# Patient Record
Sex: Female | Born: 1975 | Hispanic: Yes | Marital: Married | Smoking: Never smoker
Health system: Southern US, Community
[De-identification: ages and names within clinical notes are randomized; demographics above are authoritative.]

## PROBLEM LIST (undated history)

## (undated) DIAGNOSIS — F419 Anxiety disorder, unspecified: Secondary | ICD-10-CM

## (undated) DIAGNOSIS — O24419 Gestational diabetes mellitus in pregnancy, unspecified control: Secondary | ICD-10-CM

## (undated) DIAGNOSIS — O009 Unspecified ectopic pregnancy without intrauterine pregnancy: Secondary | ICD-10-CM

## (undated) DIAGNOSIS — I10 Essential (primary) hypertension: Secondary | ICD-10-CM

## (undated) DIAGNOSIS — R002 Palpitations: Secondary | ICD-10-CM

## (undated) HISTORY — PX: HERNIA REPAIR: SHX51

---

## 2004-06-19 LAB — SICKLE CELL SCREEN: Sickle Cell Screen: NEGATIVE

## 2005-10-27 ENCOUNTER — Emergency Department (HOSPITAL_COMMUNITY): Admission: EM | Admit: 2005-10-27 | Discharge: 2005-10-27 | Payer: Self-pay | Admitting: Emergency Medicine

## 2005-11-16 ENCOUNTER — Inpatient Hospital Stay (HOSPITAL_COMMUNITY): Admission: AD | Admit: 2005-11-16 | Discharge: 2005-11-16 | Payer: Self-pay | Admitting: Obstetrics

## 2005-11-17 ENCOUNTER — Inpatient Hospital Stay (HOSPITAL_COMMUNITY): Admission: AD | Admit: 2005-11-17 | Discharge: 2005-11-19 | Payer: Self-pay | Admitting: Obstetrics

## 2005-11-21 ENCOUNTER — Emergency Department (HOSPITAL_COMMUNITY): Admission: EM | Admit: 2005-11-21 | Discharge: 2005-11-21 | Payer: Self-pay | Admitting: Emergency Medicine

## 2007-04-03 ENCOUNTER — Emergency Department (HOSPITAL_COMMUNITY): Admission: EM | Admit: 2007-04-03 | Discharge: 2007-04-03 | Payer: Self-pay | Admitting: Emergency Medicine

## 2008-02-23 ENCOUNTER — Inpatient Hospital Stay (HOSPITAL_COMMUNITY): Admission: AD | Admit: 2008-02-23 | Discharge: 2008-02-25 | Payer: Self-pay | Admitting: Obstetrics

## 2008-08-26 ENCOUNTER — Emergency Department (HOSPITAL_COMMUNITY): Admission: EM | Admit: 2008-08-26 | Discharge: 2008-08-27 | Payer: Self-pay | Admitting: Emergency Medicine

## 2008-09-20 ENCOUNTER — Emergency Department (HOSPITAL_COMMUNITY): Admission: EM | Admit: 2008-09-20 | Discharge: 2008-09-20 | Payer: Self-pay | Admitting: Emergency Medicine

## 2008-09-24 ENCOUNTER — Emergency Department (HOSPITAL_COMMUNITY): Admission: EM | Admit: 2008-09-24 | Discharge: 2008-09-24 | Payer: Self-pay | Admitting: Emergency Medicine

## 2008-09-24 DIAGNOSIS — I10 Essential (primary) hypertension: Secondary | ICD-10-CM

## 2008-09-24 HISTORY — DX: Essential (primary) hypertension: I10

## 2011-01-08 LAB — URINALYSIS, ROUTINE W REFLEX MICROSCOPIC
Bilirubin Urine: NEGATIVE
Protein, ur: NEGATIVE mg/dL
Specific Gravity, Urine: 1.011 (ref 1.005–1.030)
Urobilinogen, UA: 1 mg/dL (ref 0.0–1.0)

## 2011-01-08 LAB — COMPREHENSIVE METABOLIC PANEL
ALT: 299 U/L — ABNORMAL HIGH (ref 0–35)
AST: 128 U/L — ABNORMAL HIGH (ref 0–37)
Albumin: 3.8 g/dL (ref 3.5–5.2)
Alkaline Phosphatase: 123 U/L — ABNORMAL HIGH (ref 39–117)
Chloride: 106 mEq/L (ref 96–112)
GFR calc Af Amer: >60 mL/min (ref >60)
Potassium: 3.4 mEq/L — ABNORMAL LOW (ref 3.5–5.1)
Total Bilirubin: 0.7 mg/dL (ref 0.3–1.2)

## 2011-01-08 LAB — CBC
HCT: 40.9 % (ref 36.0–46.0)
Platelets: 218 10*3/uL (ref 150–400)
RBC: 4.72 MIL/uL (ref 3.87–5.11)
WBC: 6.7 10*3/uL (ref 4.0–10.5)

## 2011-01-08 LAB — DIFFERENTIAL
Basophils Absolute: 0.1 10*3/uL (ref 0.0–0.1)
Basophils Relative: 1 % (ref 0–1)
Eosinophils Absolute: 0.6 10*3/uL (ref 0.0–0.7)
Eosinophils Relative: 8 % — ABNORMAL HIGH (ref 0–5)
Monocytes Absolute: 0.3 10*3/uL (ref 0.1–1.0)

## 2011-01-08 LAB — URINE MICROSCOPIC-ADD ON

## 2011-02-09 NOTE — Discharge Summary (Signed)
NAMETERE, MCCONAUGHEY              ACCOUNT NO.:  [id number]   MEDICAL RECORD NO.:  [id number]          PATIENT TYPE:  INP   LOCATION:  9141                          FACILITY:  [facility]   PHYSICIAN:  Kathreen Cosier, M.D.DATE OF BIRTH:  1975-12-09   DATE OF ADMISSION:  11/17/2005  DATE OF DISCHARGE:  11/19/2005                                 DISCHARGE SUMMARY   HOSPITAL COURSE:  The patient is a 35 year old gravida 2, para 0-0-1-0 with  EDC November 22, 2005.  She was admitted with rupture of membranes at 03:30 A.M.  She had a negative GBS.  Blood pressure was 170/105.  Negative protein.  Platelet count 200,000.  She was started on magnesium 4 grams now and 2  grams an hour.  The patient had a normal vaginal delivery over a midline  episiotomy and post delivery her hemoglobin was 9.1.  Mag sulfate was  discontinued on the first postpartum day and her blood pressures were all  normal.  She was discharged on the second postpartum day on Labetalol 200 mg  p.o. b.i.d. and Tylenol #3 for pain and is to see me in one week for blood  pressure check.   DISCHARGE DIAGNOSES:  1.  Status post normal vaginal delivery at term.  2.  Pre-eclampsia.           ______________________________  Kathreen Cosier, M.D.     BAM/MEDQ  D:  12/12/2005  T:  12/12/2005  Job:  [id number]

## 2011-06-21 LAB — COMPREHENSIVE METABOLIC PANEL
ALT: 44 — ABNORMAL HIGH
AST: 38 — ABNORMAL HIGH
Albumin: 2.2 — ABNORMAL LOW
Alkaline Phosphatase: 141 — ABNORMAL HIGH
BUN: 13
CO2: 23
Calcium: 7.8 — ABNORMAL LOW
Chloride: 105
Creatinine, Ser: 0.61
Creatinine, Ser: 0.68
GFR calc Af Amer: >60
GFR calc non Af Amer: >60
Total Bilirubin: 0.4
Total Protein: 5.3 — ABNORMAL LOW

## 2011-06-21 LAB — CBC
HCT: 33.4 — ABNORMAL LOW
MCHC: 35
MCV: 90.5
MCV: 91.1
Platelets: 158
RBC: 4.17
RDW: 13.7
WBC: 10.1

## 2011-06-21 LAB — LACTATE DEHYDROGENASE: LDH: 203

## 2011-06-21 LAB — RPR: RPR Ser Ql: NONREACTIVE

## 2011-06-21 LAB — MAGNESIUM: Magnesium: 6 — ABNORMAL HIGH

## 2011-06-21 LAB — URIC ACID: Uric Acid, Serum: 5.2

## 2011-06-29 LAB — POCT I-STAT, CHEM 8
BUN: 16 mg/dL (ref 6–23)
Calcium, Ion: 1.13 mmol/L (ref 1.12–1.32)
Chloride: 103 meq/L (ref 96–112)
Creatinine, Ser: 0.8 mg/dL (ref 0.4–1.2)
Glucose, Bld: 122 mg/dL — ABNORMAL HIGH (ref 70–99)
HCT: 41 % (ref 36.0–46.0)
Hemoglobin: 13.9 g/dL (ref 12.0–15.0)
Potassium: 3.6 mEq/L (ref 3.5–5.1)
Sodium: 140 meq/L (ref 135–145)
TCO2: 26 mmol/L (ref 0–100)

## 2011-06-29 LAB — URINE MICROSCOPIC-ADD ON

## 2011-06-29 LAB — URINALYSIS, ROUTINE W REFLEX MICROSCOPIC
Bilirubin Urine: NEGATIVE
Nitrite: NEGATIVE
Protein, ur: NEGATIVE mg/dL
Specific Gravity, Urine: 1.016 (ref 1.005–1.030)
Urobilinogen, UA: 0.2 mg/dL (ref 0.0–1.0)

## 2011-06-29 LAB — HEPATIC FUNCTION PANEL
ALT: 87 U/L — ABNORMAL HIGH (ref 0–35)
AST: 108 U/L — ABNORMAL HIGH (ref 0–37)
Albumin: 3.9 g/dL (ref 3.5–5.2)
Alkaline Phosphatase: 97 U/L (ref 39–117)
Bilirubin, Direct: 0.2 mg/dL (ref 0.0–0.3)
Indirect Bilirubin: 0.2 mg/dL — ABNORMAL LOW (ref 0.3–0.9)
Total Bilirubin: 0.4 mg/dL (ref 0.3–1.2)
Total Protein: 7.5 g/dL (ref 6.0–8.3)

## 2011-06-29 LAB — LIPASE, BLOOD: Lipase: 38 U/L (ref 11–59)

## 2012-03-02 ENCOUNTER — Emergency Department (HOSPITAL_COMMUNITY)
Admission: EM | Admit: 2012-03-02 | Discharge: 2012-03-02 | Disposition: A | Discharge status: Home / Self Care | Payer: Self-pay | Attending: Emergency Medicine | Admitting: Emergency Medicine

## 2012-03-02 ENCOUNTER — Emergency Department (HOSPITAL_COMMUNITY): Payer: Self-pay

## 2012-03-02 ENCOUNTER — Encounter (HOSPITAL_COMMUNITY): Payer: Self-pay | Admitting: *Deleted

## 2012-03-02 DIAGNOSIS — R079 Chest pain, unspecified: Secondary | ICD-10-CM | POA: Insufficient documentation

## 2012-03-02 DIAGNOSIS — R1013 Epigastric pain: Secondary | ICD-10-CM | POA: Insufficient documentation

## 2012-03-02 DIAGNOSIS — I10 Essential (primary) hypertension: Secondary | ICD-10-CM | POA: Insufficient documentation

## 2012-03-02 DIAGNOSIS — F419 Anxiety disorder, unspecified: Secondary | ICD-10-CM

## 2012-03-02 DIAGNOSIS — F411 Generalized anxiety disorder: Secondary | ICD-10-CM | POA: Insufficient documentation

## 2012-03-02 DIAGNOSIS — K219 Gastro-esophageal reflux disease without esophagitis: Secondary | ICD-10-CM | POA: Insufficient documentation

## 2012-03-02 HISTORY — DX: Essential (primary) hypertension: I10

## 2012-03-02 LAB — HEPATIC FUNCTION PANEL
Bilirubin, Direct: <0.1 mg/dL (ref 0.0–0.3)
Total Bilirubin: 0.3 mg/dL (ref 0.3–1.2)

## 2012-03-02 LAB — CBC
MCH: 30.3 pg (ref 26.0–34.0)
MCHC: 34.8 g/dL (ref 30.0–36.0)
Platelets: 220 10*3/uL (ref 150–400)
RBC: 4.66 MIL/uL (ref 3.87–5.11)
RDW: 13 % (ref 11.5–15.5)

## 2012-03-02 LAB — URINALYSIS, ROUTINE W REFLEX MICROSCOPIC
Ketones, ur: NEGATIVE mg/dL
Leukocytes, UA: NEGATIVE
Nitrite: NEGATIVE
Urobilinogen, UA: 0.2 mg/dL (ref 0.0–1.0)
pH: 7.5 (ref 5.0–8.0)

## 2012-03-02 LAB — POCT I-STAT, CHEM 8
Calcium, Ion: 1.18 mmol/L (ref 1.12–1.32)
HCT: 42 % (ref 36.0–46.0)
Hemoglobin: 14.3 g/dL (ref 12.0–15.0)
Sodium: 141 mEq/L (ref 135–145)
TCO2: 23 mmol/L (ref 0–100)

## 2012-03-02 LAB — POCT I-STAT TROPONIN I: Troponin i, poc: 0 ng/mL (ref 0.00–0.08)

## 2012-03-02 MED ORDER — OMEPRAZOLE 20 MG PO CPDR: 20.0000 mg | DELAYED_RELEASE_CAPSULE | Freq: Every day | ORAL | Status: DC

## 2012-03-02 MED ORDER — GI COCKTAIL ~~LOC~~
ORAL | Status: AC
  Administered 2012-03-02: 30 mL via ORAL
  Filled 2012-03-02: qty 30

## 2012-03-02 MED ORDER — GI COCKTAIL ~~LOC~~
30.0000 mL | Freq: Once | ORAL | Status: AC
  Administered 2012-03-02: 30 mL via ORAL

## 2012-03-02 NOTE — ED Notes (Signed)
Patient for discharge.She denies any pain.GCS 15.

## 2012-03-02 NOTE — ED Provider Notes (Signed)
History     CSN: [id number]  Arrival date & time 03/02/12  0244   First MD Initiated Contact with Patient 03/02/12 [phone]      Chief Complaint  Patient presents with  . Chest Pain  . Abdominal Pain    (Consider location/radiation/quality/duration/timing/severity/associated sxs/prior treatment) Patient is a 36 y.o. female presenting with abdominal pain and anxiety. The history is provided by the patient. A language interpreter was used.  Abdominal Pain The primary symptoms of the illness include abdominal pain. The primary symptoms of the illness do not include shortness of breath. The current episode started yesterday. The onset of the illness was sudden. The problem has not changed since onset. The abdominal pain began yesterday. The pain came on suddenly. The abdominal pain has been unchanged since its onset. The abdominal pain is located in the epigastric region. Pain radiation: up into chest. The severity of the abdominal pain is 10/10. The abdominal pain is relieved by nothing. Exacerbated by: nothing.  Associated with: anxiety and feeling like she is having palpitations. The patient states that she believes she is currently not pregnant. The patient has not had a change in bowel habit. Risk factors: none. Symptoms associated with the illness do not include anorexia, diaphoresis or back pain. Significant associated medical issues do not include cardiac disease.  Anxiety This is a recurrent problem. The current episode started yesterday. The problem occurs constantly. The problem has not changed since onset.Associated symptoms include abdominal pain. Pertinent negatives include no headaches and no shortness of breath. The symptoms are aggravated by nothing. She has tried nothing for the symptoms. The treatment provided no relief.  PERC negative, no long car trips or plane trips no swelling or pain in the lower extremities.  No long car plane nor train trips Anxiety bring on the palpitations  and making epigastric pain worse Past Medical History  Diagnosis Date  . Hypertension     History reviewed. No pertinent past surgical history.  History reviewed. No pertinent family history.  History  Substance Use Topics  . Smoking status: Not on file  . Smokeless tobacco: Not on file  . Alcohol Use:     OB History    Grav Para Term Preterm Abortions TAB SAB Ect Mult Living                  Review of Systems  Constitutional: Negative for diaphoresis.  Respiratory: Negative for shortness of breath.   Gastrointestinal: Positive for abdominal pain. Negative for anorexia.  Musculoskeletal: Negative for back pain.  Neurological: Negative for headaches.  All other systems reviewed and are negative.    Allergies  Review of patient's allergies indicates no known allergies.  Home Medications  No current outpatient prescriptions on file.  BP 161/104  Pulse 91  Temp(Src) 98.1 F (36.7 C) (Oral)  Resp 16  SpO2 100%  Physical Exam  Constitutional: She appears well-developed and well-nourished. No distress.  HENT:  Head: Normocephalic and atraumatic.  Mouth/Throat: Oropharynx is clear and moist.  Eyes: Conjunctivae are normal. Pupils are equal, round, and reactive to light.  Neck: Normal range of motion. No JVD present.  Cardiovascular: Normal rate and regular rhythm.  Exam reveals no gallop.   No murmur heard. Pulmonary/Chest: Effort normal and breath sounds normal. She has no wheezes. She has no rales.  Abdominal: Soft. Bowel sounds are normal. She exhibits no mass. There is no tenderness. There is no rebound and no guarding.  Musculoskeletal: Normal range of motion. She  exhibits no edema and no tenderness.  Lymphadenopathy:    She has no cervical adenopathy.  Neurological: She is alert. She has normal reflexes.  Skin: Skin is warm and dry. She is not diaphoretic.  Psychiatric: She has a normal mood and affect.    ED Course  Procedures (including critical care  time)  Labs Reviewed  URINALYSIS, ROUTINE W REFLEX MICROSCOPIC - Abnormal; Notable for the following:    Specific Gravity, Urine 1.004 (*)    All other components within normal limits  POCT I-STAT, CHEM 8 - Abnormal; Notable for the following:    Glucose, Bld 132 (*)    All other components within normal limits  POCT PREGNANCY, URINE  CBC  POCT I-STAT TROPONIN I  HEPATIC FUNCTION PANEL  LIPASE, BLOOD   No results found.   No diagnosis found.    MDM   Date: 03/02/2012  Rate: 92  Rhythm: normal sinus rhythm  QRS Axis: normal  Intervals: QT prolonged  ST/T Wave abnormalities: normal  Conduction Disutrbances:none  Narrative Interpretation:   Old EKG Reviewed: none available   Symptom are consistent with GERD as were relieved completely with GI cocktail and superimposed anxiety.   Follow up with your family doctor for BP recheck.  Return for chest pain shortness of breath or any concerns      Adlyn Fife K Sigmund Morera-Rasch, MD 03/02/12 [phone]

## 2012-03-02 NOTE — ED Notes (Signed)
Using translator phones pt states that she is having palpations, and is experiencing anxiety and is having nausea and diarrhea. Pt states that this started at work yesterday and it went away. Then tonight it started again at midnight.

## 2012-03-02 NOTE — ED Notes (Signed)
Pt presented to ED with epigastric/abdo pain.GCS 15.Feels better now.

## 2013-12-12 ENCOUNTER — Emergency Department (HOSPITAL_COMMUNITY): Payer: No Typology Code available for payment source

## 2013-12-12 ENCOUNTER — Encounter (HOSPITAL_COMMUNITY): Payer: Self-pay | Admitting: Emergency Medicine

## 2013-12-12 ENCOUNTER — Emergency Department (HOSPITAL_COMMUNITY)
Admission: EM | Admit: 2013-12-12 | Discharge: 2013-12-12 | Disposition: A | Discharge status: Home / Self Care | Payer: No Typology Code available for payment source | Attending: Emergency Medicine | Admitting: Emergency Medicine

## 2013-12-12 DIAGNOSIS — O4690 Antepartum hemorrhage, unspecified, unspecified trimester: Secondary | ICD-10-CM

## 2013-12-12 DIAGNOSIS — B9689 Other specified bacterial agents as the cause of diseases classified elsewhere: Secondary | ICD-10-CM | POA: Insufficient documentation

## 2013-12-12 DIAGNOSIS — O169 Unspecified maternal hypertension, unspecified trimester: Secondary | ICD-10-CM | POA: Insufficient documentation

## 2013-12-12 DIAGNOSIS — A499 Bacterial infection, unspecified: Secondary | ICD-10-CM | POA: Insufficient documentation

## 2013-12-12 DIAGNOSIS — O2390 Unspecified genitourinary tract infection in pregnancy, unspecified trimester: Secondary | ICD-10-CM | POA: Insufficient documentation

## 2013-12-12 DIAGNOSIS — Z8659 Personal history of other mental and behavioral disorders: Secondary | ICD-10-CM | POA: Insufficient documentation

## 2013-12-12 DIAGNOSIS — O209 Hemorrhage in early pregnancy, unspecified: Secondary | ICD-10-CM | POA: Insufficient documentation

## 2013-12-12 DIAGNOSIS — Z79899 Other long term (current) drug therapy: Secondary | ICD-10-CM | POA: Insufficient documentation

## 2013-12-12 DIAGNOSIS — N760 Acute vaginitis: Secondary | ICD-10-CM | POA: Insufficient documentation

## 2013-12-12 HISTORY — DX: Anxiety disorder, unspecified: F41.9

## 2013-12-12 LAB — CBC
HCT: 39.7 % (ref 36.0–46.0)
HEMOGLOBIN: 14.1 g/dL (ref 12.0–15.0)
MCH: 31.2 pg (ref 26.0–34.0)
MCHC: 35.5 g/dL (ref 30.0–36.0)
MCV: 87.8 fL (ref 78.0–100.0)
Platelets: 247 10*3/uL (ref 150–400)
RBC: 4.52 MIL/uL (ref 3.87–5.11)
RDW: 13 % (ref 11.5–15.5)
WBC: 9.9 10*3/uL (ref 4.0–10.5)

## 2013-12-12 LAB — URINALYSIS, ROUTINE W REFLEX MICROSCOPIC
BILIRUBIN URINE: NEGATIVE
GLUCOSE, UA: NEGATIVE mg/dL
Ketones, ur: NEGATIVE mg/dL
Leukocytes, UA: NEGATIVE
NITRITE: NEGATIVE
PH: 7.5 (ref 5.0–8.0)
Protein, ur: NEGATIVE mg/dL
SPECIFIC GRAVITY, URINE: 1.016 (ref 1.005–1.030)
Urobilinogen, UA: 0.2 mg/dL (ref 0.0–1.0)

## 2013-12-12 LAB — PREGNANCY, URINE: Preg Test, Ur: POSITIVE — AB

## 2013-12-12 LAB — URINE MICROSCOPIC-ADD ON

## 2013-12-12 LAB — WET PREP, GENITAL
Trich, Wet Prep: NONE SEEN
Yeast Wet Prep HPF POC: NONE SEEN

## 2013-12-12 LAB — HCG, QUANTITATIVE, PREGNANCY: HCG, BETA CHAIN, QUANT, S: 2494 m[IU]/mL — AB (ref <5)

## 2013-12-12 MED ORDER — METRONIDAZOLE 500 MG PO TABS: 500.0000 mg | ORAL_TABLET | Freq: Two times a day (BID) | ORAL | Status: DC

## 2013-12-12 NOTE — ED Notes (Signed)
Back from xray

## 2013-12-12 NOTE — ED Provider Notes (Signed)
CSN: [id number]     Arrival date & time 12/12/13  0912 History   First MD Initiated Contact with Patient 12/12/13 1009     Chief Complaint  Patient presents with  . Vaginal Bleeding  . Possible Pregnancy      HPI Pt was seen at 1025. Per pt, c/o gradual onset and persistence of constant vaginal bleeding since yesterday. Pt states she noticed blood on the toilet paper after she wiped yesterday. Today, she states "there is more blood spots on the toilet paper."  [facility] and was told she "was pregnant."  Hx G4P2SAB1 with LMP 09/25/13, EGA 11-1/7 weeks.  Denies vaginal discharge, no abd/pelvic pain, no back pain, no CP/SOB.    No OB/GYN Past Medical History  Diagnosis Date  . Hypertension   . GERD (gastroesophageal reflux disease)   . Anxiety    No past surgical history on file.  History  Substance Use Topics  . Smoking status: Never Smoker   . Smokeless tobacco: Not on file  . Alcohol Use: No    Review of Systems ROS: Statement: All systems negative except as marked or noted in the HPI; Constitutional: Negative for fever and chills. ; ; Eyes: Negative for eye pain, redness and discharge. ; ; ENMT: Negative for ear pain, hoarseness, nasal congestion, sinus pressure and sore throat. ; ; Cardiovascular: Negative for chest pain, palpitations, diaphoresis, dyspnea and peripheral edema. ; ; Respiratory: Negative for cough, wheezing and stridor. ; ; Gastrointestinal: Negative for nausea, vomiting, diarrhea, abdominal pain, blood in stool, hematemesis, jaundice and rectal bleeding. . ; ; Genitourinary: Negative for dysuria, flank pain and hematuria. ; ; GYN:  +vaginal bleeding, no vaginal discharge, no vulvar pain.;;  Musculoskeletal: Negative for back pain and neck pain. Negative for swelling and trauma.; ; Skin: Negative for pruritus, rash, abrasions, blisters, bruising and skin lesion.; ; Neuro: Negative for headache, lightheadedness and neck stiffness.  Negative for weakness, altered level of consciousness , altered mental status, extremity weakness, paresthesias, involuntary movement, seizure and syncope.      Allergies  Review of patient's allergies indicates no known allergies.  Home Medications   Current Outpatient Rx  Name  Route  Sig  Dispense  Refill  . Prenatal Vit-Fe Fumarate-FA (PRENATAL MULTIVITAMIN) TABS tablet   Oral   Take 1 tablet by mouth daily at 12 noon.         Marland Kitchen PRESCRIPTION MEDICATION   Oral   Take 1 tablet by mouth daily. Blood Pressure medication          BP 150/90  Pulse 66  Temp(Src) 98 F (36.7 C) (Oral)  Resp 20  Wt 144 lb 4 oz (65.431 kg)  SpO2 97%  LMP 09/25/2013 Physical Exam 1030; Physical examination:  Nursing notes reviewed; Vital signs and O2 SAT reviewed;  Constitutional: Well developed, Well nourished, Well hydrated, In no acute distress; Head:  Normocephalic, atraumatic; Eyes: EOMI, PERRL, No scleral icterus; ENMT: Mouth and pharynx normal, Mucous membranes moist; Neck: Supple, Full range of motion, No lymphadenopathy; Cardiovascular: Regular rate and rhythm, No gallop; Respiratory: Breath sounds clear & equal bilaterally, No rales, rhonchi, wheezes.  Speaking full sentences with ease, Normal respiratory effort/excursion; Chest: Nontender, Movement normal; Abdomen: Soft, Nontender, Nondistended, Normal bowel sounds; Genitourinary: No CVA tenderness. Pelvic exam performed with permission of pt and female ED RN assist during exam.  External genitalia w/o lesions. Vaginal vault without discharge, +dark blood with several clots.  Cervix w/o  lesions, not friable, os is open without active bleeding. GC/chlam and wet prep obtained and sent to lab..;; Extremities: Pulses normal, No tenderness, No edema, No calf edema or asymmetry.; Neuro: AA&Ox3, Major CN grossly intact.  Speech clear. No gross focal motor or sensory deficits in extremities. Climbs on and off stretcher easily by herself. Gait steady.;  Skin: Color normal, Warm, Dry.   ED Course  Procedures     EKG Interpretation None      MDM  MDM Reviewed: previous chart, nursing note and vitals Interpretation: labs and ultrasound    Results for orders placed during the hospital encounter of 12/12/13  WET PREP, GENITAL      Result Value Ref Range   Yeast Wet Prep HPF POC NONE SEEN  NONE SEEN   Trich, Wet Prep NONE SEEN  NONE SEEN   Clue Cells Wet Prep HPF POC RARE (*) NONE SEEN   WBC, Wet Prep HPF POC RARE (*) NONE SEEN  URINALYSIS, ROUTINE W REFLEX MICROSCOPIC      Result Value Ref Range   Color, Urine YELLOW  YELLOW   APPearance CLEAR  CLEAR   Specific Gravity, Urine 1.016  1.005 - 1.030   pH 7.5  5.0 - 8.0   Glucose, UA NEGATIVE  NEGATIVE mg/dL   Hgb urine dipstick LARGE (*) NEGATIVE   Bilirubin Urine NEGATIVE  NEGATIVE   Ketones, ur NEGATIVE  NEGATIVE mg/dL   Protein, ur NEGATIVE  NEGATIVE mg/dL   Urobilinogen, UA 0.2  0.0 - 1.0 mg/dL   Nitrite NEGATIVE  NEGATIVE   Leukocytes, UA NEGATIVE  NEGATIVE  PREGNANCY, URINE      Result Value Ref Range   Preg Test, Ur POSITIVE (*) NEGATIVE  CBC      Result Value Ref Range   WBC 9.9  4.0 - 10.5 K/uL   RBC 4.52  3.87 - 5.11 MIL/uL   Hemoglobin 14.1  12.0 - 15.0 g/dL   HCT 16.1  09.6 - 04.5 %   MCV 87.8  78.0 - 100.0 fL   MCH 31.2  26.0 - 34.0 pg   MCHC 35.5  30.0 - 36.0 g/dL   RDW 40.9  81.1 - 91.4 %   Platelets 247  150 - 400 K/uL  HCG, QUANTITATIVE, PREGNANCY      Result Value Ref Range   hCG, Beta Chain, Quant, S 2494 (*) <5 mIU/mL  URINE MICROSCOPIC-ADD ON      Result Value Ref Range   Squamous Epithelial / LPF FEW (*) RARE   RBC / HPF 21-50  <3 RBC/hpf  ABO/RH      Result Value Ref Range   ABO/RH(D) O POS     US Ob Transvaginal 12/12/2013   CLINICAL DATA:  Pregnant, vaginal bleeding, beta HCG 2494  EXAM: OBSTETRIC <14 WK [place] AND TRANSVAGINAL OB US  TECHNIQUE: Both transabdominal and transvaginal ultrasound examinations were performed for complete  evaluation of the gestation as well as the maternal uterus, adnexal regions, and pelvic cul-de-sac. Transvaginal technique was performed to assess early pregnancy.  COMPARISON:  None.  FINDINGS: Intrauterine gestational sac: Not visualized  Maternal uterus/adnexae: Endometrial complex measures 8 mm. Scattered cystic foci at the junction of the endometrium and myometrium, without discrete gestational sac.  Left ovary measures 3.3 x 2.0 x 2.4 cm and is within normal limits.  Right ovary measures 6.1 x 1.5 x 4.1 cm. Associated complex cystic lesion appears intra-ovarian and is favored to reflect a corpus luteum (image 41). Adjacent 2.9  x 2.0 x 2.8 cm cystic lesion may be paraovarian (image 45) but does not demonstrate significant vascularity.  No free fluid.  IMPRESSION: No IUP is visualized.  By definition, this reflects a pregnancy of unknown location. Primary differential considerations include abnormal IUP/missed abortion versus nonvisualized ectopic. Early normal IUP is considered unlikely given the beta HCG of 2494.  Two right ovarian lesions are present, favored to reflect an intra-ovarian corpus luteum and a benign paraovarian cyst. Neither is specific for ectopic pregnancy, although this is difficult to exclude for the paraovarian lesion.  Correlate with serial beta HCG. If increasing, a right adnexal ectopic would be the diagnosis of exclusion. If decreasing, this likely reflects a missed abortion.   Electronically Signed   By: Charline BillsSriyesh  Krishnan M.D.   On: 12/12/2013 13:04    1430:  Pt's VS remain stable, abd soft/NT. Continues to deny abd/pelvic pain. T/C to OB/GYN Dr. Marice Potterove, case discussed, including:  HPI, pertinent PM/SHx, VS/PE, dx testing, ED course and treatment:  requests to discharge pt, have [facility] in 48 hours for re-check of HCG quant, and also inform pt to [facility] if she has any problems/concerns before then.  Dx and testing, as well as d/w OB/GYN MD, d/w pt.   Questions answered.  Verb understanding, agreeable to d/c home with outpt f/u.     Laray AngerKathleen M Fisher Hargadon, DO 12/14/13 2135

## 2013-12-12 NOTE — ED Notes (Addendum)
Per interpreter:""I am 2 months pregnant and yesterday, when I wiped after going to bathroom I found some blood on the toilet paper.  Yesterday not so much blood but this morning it was a little bit more."    Patient states, "I have been feeling fine, there is no pain, there is only blood spots on toilet paper."   Denies changes in urinary status, denies vaginal discharge, denies abdominal pain or any pain whatsoever.  No acute distress noted.

## 2013-12-12 NOTE — ED Notes (Signed)
Patient taken to US via stretcher.

## 2013-12-12 NOTE — ED Notes (Addendum)
Pt presents with possible pregnancy complication. Pt states that she is 2 months pregnant. Pt states that "it was pink on the paper". Pt denies any recent illness, abd cramps orother discharge. Pt is G3 P2.  Pt has a positive pregnancy with [facility] on 3.3.

## 2013-12-12 NOTE — Discharge Instructions (Signed)
Resource Guide (Spanish)   Problemas Dentales  Pacientes con Medicaid    :                                                               [facility] [place]   [employer] [phone]                          [phone] [address] [place], [place]                          [place], [place]                                             Phone:  [phone]               Phone:  [phone] Si usted no puede pagar o no tiene seguro mdico/dental pngase en contacto con: [facility] o el departamento de salud de [place] para que le autoricen el uso de la Therapist, art para adultos.  Problemas con Dolor Crnico Pngase en contacto con la clinica de dolores cronicos en el hospital de [place], [phone].   Los pacientes deben ser [employer] por su medico de cabecera.  Si usted no tiene dinero para pagar sus medicinas Pngase en contacto con [employer]:  llame al "211" o Insurance underwriter.   Phone: [phone]  Si usted no tiene mdico de [employer]      Phone: [phone]. Otras agencias que ofrecen cuidado mdico barato son:      [facility] [place]  [phone]      [facility]  [phone]      [facility]  [phone]      [facility]  [phone]      [facility]  [phone]      [facility]  [phone]  [facility] L[facility]  [phone] [facility]  [phone] [facility]  [phone]  (servicios de emergencia [phone])  Abuse/Neglect [facility] Child Abuse Hotline [phone] [facility] Child Abuse Hotline 773-075-4964 (After Hours)  Emergency Shelter [facility] [phone]  Maternity Homes [facility] [place] [phone] [facility] [phone]  MRSA Hotline #:   8487578369  Servicios de [facility] [place]    [employer]           [facility] Dept. [address]                                    [address]       [address] [place]                                         Michell Heinrich  [place]hell [place]rich[place] Phone:  [phone]                            Phone: [phone]                Phone: [phone]  [facility] Phone:  [phone]  [facility] Child Abuse Hotline (671)592-6470(336) (351) 617-4684 [phone](336) [phone] (After Hours)    Your ultrasound today did not show a gestational sac today.  This may be because of a threatened miscarriage, or because of an ectopic pregnancy.  You will need a repeat of your "quantitative HCG" (blood test) in 48 hours to help make this determination.  Avoid strenuous activity and do NOT place anything into your vagina, ie: no douching, no tampons, no sexual intercourse, no swimming or tub baths, until you are seen in follow up by the OB/GYN doctor.  Go to [facility] in 48 hours to recheck your quantitative HCG blood test.  [facility] to [facility] immediately sooner if worsening, or for any other concerns.

## 2013-12-13 ENCOUNTER — Inpatient Hospital Stay (HOSPITAL_COMMUNITY): Payer: No Typology Code available for payment source

## 2013-12-13 ENCOUNTER — Inpatient Hospital Stay (HOSPITAL_COMMUNITY)
Admission: AD | Admit: 2013-12-13 | Discharge: 2013-12-13 | Disposition: A | Discharge status: Home / Self Care | Payer: No Typology Code available for payment source | Source: Ambulatory Visit | Attending: Obstetrics and Gynecology | Admitting: Obstetrics and Gynecology

## 2013-12-13 ENCOUNTER — Encounter (HOSPITAL_COMMUNITY): Payer: Self-pay

## 2013-12-13 DIAGNOSIS — O00109 Unspecified tubal pregnancy without intrauterine pregnancy: Secondary | ICD-10-CM | POA: Insufficient documentation

## 2013-12-13 DIAGNOSIS — O009 Unspecified ectopic pregnancy without intrauterine pregnancy: Secondary | ICD-10-CM

## 2013-12-13 DIAGNOSIS — I10 Essential (primary) hypertension: Secondary | ICD-10-CM | POA: Insufficient documentation

## 2013-12-13 DIAGNOSIS — K219 Gastro-esophageal reflux disease without esophagitis: Secondary | ICD-10-CM | POA: Insufficient documentation

## 2013-12-13 LAB — CREATININE, SERUM
Creatinine, Ser: 0.81 mg/dL (ref 0.50–1.10)
GFR calc Af Amer: >90 mL/min (ref >90)
GFR calc non Af Amer: >90 mL/min (ref >90)

## 2013-12-13 LAB — CBC
HCT: 38.3 % (ref 36.0–46.0)
Hemoglobin: 13.4 g/dL (ref 12.0–15.0)
MCH: 30.7 pg (ref 26.0–34.0)
MCHC: 35 g/dL (ref 30.0–36.0)
MCV: 87.8 fL (ref 78.0–100.0)
Platelets: 223 10*3/uL (ref 150–400)
RBC: 4.36 MIL/uL (ref 3.87–5.11)
RDW: 12.9 % (ref 11.5–15.5)
WBC: 10 10*3/uL (ref 4.0–10.5)

## 2013-12-13 LAB — ABO/RH: ABO/RH(D): O POS

## 2013-12-13 LAB — URINE CULTURE
Colony Count: NO GROWTH
Culture: NO GROWTH

## 2013-12-13 LAB — HCG, QUANTITATIVE, PREGNANCY: hCG, Beta Chain, Quant, S: 2115 m[IU]/mL — ABNORMAL HIGH (ref <5)

## 2013-12-13 LAB — BUN: BUN: 14 mg/dL (ref 6–23)

## 2013-12-13 LAB — AST: AST: 32 U/L (ref 0–37)

## 2013-12-13 MED ORDER — METHOTREXATE INJECTION FOR WOMEN'S HOSPITAL
50.0000 mg/m2 | Freq: Once | INTRAMUSCULAR | Status: AC
  Administered 2013-12-13: 85 mg via INTRAMUSCULAR
  Filled 2013-12-13: qty 1.7

## 2013-12-13 NOTE — Discharge Instructions (Signed)
Embarazo ectpico ( Ectopic Pregnancy) Un embarazo ectpico se produce cuando el vulo fecundado se adhiere (implanta) fuera del tero. La mayora de los embarazos ectpicos se producen en la trompa de [name] Perry. Es raro que ocurran en el ovario, el intestino, la pelvis o el cuello del tero. En un embarazo ectpico, el vulo fertilizado no tiene la capacidad de llegar a ser un beb normal y sano.  [name] DearUna ruptura en el embarazo ectpico se produce cuando la trompa de Falopio se desgarra o se rompe y Futures traderda como resultado una hemorragia interna. A menudo hay un intenso dolor abdominal y en algunos casos sangrado vaginal. Tener un embarazo ectpico puede ser [name] Dearuna experiencia que pone en peligro la vida. Si no se trata, esta afeccin es muy grave y puede requerir una transfusin de [name] Perry, ciruga abdominal, o incluso causar la muerte. CAUSAS  En la Harley-Davidsonmayora de los casos se sospecha un dao en las trompas de Falopio como causa del Multimedia programmerembarazo ectpico.  FACTORES DE RIESGO Dependiendo de sus circunstancias, el nivel de riesgo de tener un embarazo ectpico variar. El [name] Perry de riesgo puede dividirse en tres categoras. Alto Riesgo  Usted ha realizado tratamientos para la infertilidad.  Ha tenido un embarazo ectpico previo.  Fue sometida a [place]una ciruga de trompas.  Tuvo una ciruga previa para ligar las trompas de Falopio (ligadura de trompas).  Tiene problemas o enfermedades en las trompas.  Ha estado expuesta al DES. El DES es un medicamento que se Nechama Guardutiliz hasta 1971 y Cox Communicationstuvo efectos en los bebs cuyas madres lo tomaron.  Queda embarazada mientras Bots[place]ausa un dispositivo intrauterino (DIU) como mtodo anticonceptivo. Riesgo moderado  Tiene antecedentes de infertilidad.  Ha sufrido alguna enfermedad de transmisin sexual (ETS).  Tiene antecedentes de enfermedad plvica inflamatoria (EPI).  Tiene cicatrices por endometriosis.  Tiene mltiples parejas sexuales.  Fuma. Bajo riesgo  Fue sometida a una  ciruga plvica.  Botswanasa duchas vaginales.  Comenz a ser Wm. Wrigley Jr. Companysexualmente activa antes de los 18 aos de Pine[place]okedad. SIGNOS Y SNTOMAS  El embarazo ectpico se debe sospechar en cualquier mujer a la que le ha faltado un perodo y tiene dolor abdominal o sangrado.  Puede experimentar sntomas normales de Psychiatristembarazo, tales como:  Nuseas.  Cansancio.  Inflamacin mamaria.  Otros sntomas son:  Futures traderDolor durante las relaciones sexuales.  Hemorragia vaginal o manchado irregular.  Clicos o dolor en uno de los lados o en la zona inferior del abdomen.  Latidos cardacos acelerados.  Desmayarse al defecar.  Los sntomas de un embarazo ectpico con ruptura y hemorragias internas son:  Dolor intenso y sbito en el abdomen y la pelvis.  Mareos o Newell Rubbermaiddesmayos.  Dolor en la zona del hombro. DIAGNSTICO  Los exmenes que se indicarn son:  Test de University Gardensembarazo.  Una ecografa.  Prueba de nivel especfico de hormona del embarazo en el torrente sanguneo.  Extraccin de [place]una muestra de tejido del tero (dilatacin y curetaje, D y C).  Ciruga para Charity fundraiserrealizar un examen visual del interior del abdomen usando un tubo delgado, que emite luz con una pequea cmara en el extremo (laparoscopio). TRATAMIENTO  Se podr aplicar una inyeccin de un medicamento denominado [name] Perry. Este medicamento hace que se absorba el tejido del [place] Berlinembarazo. Se administra en los siguientes casos:  Hay un diagnstico temprano.  La trompa de Falopio no se ha roto.  Se la considera una buena candidata para recibir [name] Perryel medicamento. Por lo general, despus del tratamiento con [name] Perry se comprueban los niveles de hormonas del [place] Parkembarazo. Esto se  hace para asegurarse de que el medicamento es [place]. Puede llevar entre cuatro y seis semanas para que el embarazo sea absorbido (aunque la mayora de los embarazos se Environmental health practitioner a Doctor, hospital). Puede ser necesario realizar un tratamiento quirrgico. Se puede utilizar un laparoscopio para  retirar el tejido del Psychiatrist. Si hay una hemorragia interna grave, le harn un corte (incisin) en la zona inferior del abdomen (laparotoma), y se extirpar Firefighter ectpico. De este modo de detendr el sangrado. Es posible que tambin se extirpe una parte de la trompa de Falopio, o toda la trompa (salpingectoma). Luego de la [place], le harn un anlisis de la hormona del embarazo para asegurarse de que no quedan tejidos. Quizs reciba la vacuna de inmunoglobulina Rho(D) si usted es Rh negativa y el padre es Rh positivo, o si no conoce el tipo de Rh del padre. Esto evita problemas en prximos embarazos. SOLICITE ATENCIN MDICA DE INMEDIATO SI:  Tiene sntomas de embarazo ectpico. Esto es una emergencia mdica. Document Released: 09/10/2005 Document Revised: 07/01/2013 [name] Perry Patient Information 2014 [place], [place].  Tratamiento con [name] Perry para Firefighter ectpico (Methotrexate Treatment for an Ectopic Pregnancy) El Multimedia programmer se produce cuando el huevo fertilizado se adhiere (implanta) fuera del tero. La mayora de los embarazos ectpicos se producen en la trompa de [place]. Es raro que ocurran en el ovario, el intestino, la pelvis o el cuello uterino. Un embarazo ectpico no tiene la capacidad de llegar a ser un beb normal y sano. Puede ser [name] Perry experiencia que pone en peligro la vida. Sin embargo, si se diagnostica precozmente, puede tratarse con un medicamento. Este medicamento es [name] Perry. El [name] Perry acta eficientemente impidiendo que el embarazo siga. Ayuda al organismo a absorber el tejido del Psychiatrist en un perodo de 2 a 6 semanas (aunque la mayora de los embarazos sern absorbidos aproximadamente a las 3 semanas).  Si el [name] Perry es [place], hay una buena probabilidad de que la trompa de Falopio puede salvarse. Independientemente de si la trompa de Falopio puede salvarse, una madre que ha tenido un Psychiatrist ectpico tiene un riesgo mucho mayor de Warehouse manager  otro embarazo ectpico en los embarazos futuros. [name] Perry seria preocupacin es el desgarro potencial de la trompa de Falopio (ruptura). Si esto ocurre, es necesario realizar [place] de emergencia para Office manager, y el [name] Perry no se puede Chemical engineer.  La paciente ideal para recibir [name] Perry es aquella que:   No sufra una hemorragia interna.  No sienta dolor abdominal intenso o persistente.  Se haya comprometido a seguir adelante con las pruebas de laboratorio y Water quality scientist a los controles hasta que el embarazo ectpico se haya absorbido.  Sea sana y tenga las funciones hepticas y renales normales en el momento de la evaluacin. El [name] Perry no debe indicarse en mujeres que:   Estn amamantando.  Tener un embarazo normal (embarazo intrauterino).  Sufrir enfermedades hepticas, pulmonares o renales.  Tienen problemas sanguneos.  Son alrgicas al [employer].  Sufren de Office manager.  Tener un embarazo ectpico mayor de 1  pulgadas (3,5 cm) o en el que haya latidos cardacos fetales. Esta es una norma que se sigue la mayor parte del tiempo (contraindicacin [place]). ANTES DEL TRATAMIENTO  Antes de administrar el medicamento:  Le realizarn pruebas hepticas, renales y anlisis de sangre completo.  Los ARAMARK Corporation de sangre se Radiographer, therapeutic para [employer] niveles hormonales de Psychiatrist y para Warehouse manager tipo sanguneo de la [place].  Si la mujer es Rh negativa y el padre  es Rh positivo o su Rh es desconocido, le administrarn una inyeccin de RhoGAM. TRATAMIENTO Hay dos formas que puede indicar el mdico para usar [name] Perry. Una forma consiste en una sola dosis o inyeccin del medicamento. La otra forma consiste en una serie de dosis. Este mtodo implica la aplicacin de varias inyecciones. Los ARAMARK Corporation de sangre se tomarn durante varias semanas para Harley-Davidson niveles de la hormona del [place]. Los ARAMARK Corporation de sangre se Programme researcher, broadcasting/film/video que no se detecta ms  hormona del Arts development officer.  DESPUS DEL TRATAMIENTO  Durante algunas semanas le harn anlisis de sangre para controlar los niveles de hormonas del [place]. Los anlisis se indican hasta que no se detecte ms hormonas del Arts development officer. Tambin existe riesgo de ruptura del embarazo ectpico cuando se Engineer, materials. Este medicamento tambin puede causar [place], que son:   Elliot Cousin y vmitos.  Llagas en la boca.  Diarrea.  Erupcin.  Mareos.  Mayor dolor abdominal.  Aumento de la hemorragia vaginal.  Neumona.  Rayna Sexton del tratamiento.  Prdida del cabello (raro y reversible). En muy raras ocasiones puede afectar el recuento de glbulos rojos, el hgado, la mdula o los niveles de hormonas. Si esto sucede, Administrator, Civil Service otras evaluaciones.  Document Released: 06/20/2005 Document Revised: 12/03/2011 [facility] Patient Information 2014 [place], [place].

## 2013-12-13 NOTE — MAU Note (Signed)
Pt has worn two pads today. Scant amt of bleeding noted on pad,   Had low back pain earlier today but has since subsided.

## 2013-12-13 NOTE — MAU Provider Note (Signed)
Case reviewed, ultrasound reviewed, labs reviewed, with declining Qhcg confirmed. Blood type Rh pos. Translator used to discuss management with the patient, who after discussion of treatment and followup, agrees to Methotrexate therapy. Medical hx notable for chtn on HCTZ.  `````Attestation of Attending Supervision of Advanced Practitioner: Evaluation and management procedures were performed by the PA/NP/CNM/OB Fellow under my supervision/collaboration. Chart reviewed and agree with management and plan.  Treshon Stannard V 12/13/2013 11:10 PM

## 2013-12-13 NOTE — MAU Provider Note (Signed)
History     CSN: [id number]  Arrival date and time: 12/13/13 1806   First Provider Initiated Contact with Patient 12/13/13 1910      No chief complaint on file.  HPI 38 y.o. [id number] at 11.[redacted] weeks EGA by LMP. Pt reports vaginal bleeding x 3 days. Initially spotting, now light bleeding. No pain at this time. Awoke today w/ some severe low back pain that has since resolved. [facility] quant 2494, u/s showed no IUP, 2 right ovarian cysts that were not suspicious for ectopic per radiologist's report. Pt was instructed to f/u here in 48 hours for repeat labs.   Past Medical History  Diagnosis Date  . Hypertension   . GERD (gastroesophageal reflux disease)   . Anxiety     History reviewed. No pertinent past surgical history.  History reviewed. No pertinent family history.  History  Substance Use Topics  . Smoking status: Never Smoker   . Smokeless tobacco: Not on file  . Alcohol Use: No    Allergies: No Known Allergies  Prescriptions prior to admission  Medication Sig Dispense Refill  . metroNIDAZOLE (FLAGYL) 500 MG tablet Take 1 tablet (500 mg total) by mouth 2 (two) times daily.  14 tablet  0  . Prenatal Vit-Fe Fumarate-FA (PRENATAL MULTIVITAMIN) TABS tablet Take 1 tablet by mouth daily at 12 noon.      Marland Kitchen. PRESCRIPTION MEDICATION Take 1 tablet by mouth daily. Blood Pressure medication        Review of Systems  Constitutional: Negative.   Respiratory: Negative.   Cardiovascular: Negative.   Gastrointestinal: Negative for nausea, vomiting, abdominal pain, diarrhea and constipation.  Genitourinary: Negative for dysuria, urgency, frequency, hematuria and flank pain.       + light bleeding dyspareunia  Musculoskeletal: Negative.   Neurological: Negative.   Psychiatric/Behavioral: Negative.    Physical Exam   Blood pressure 140/99, pulse 78, temperature 97.5 F (36.4 C), temperature source Oral, resp. rate 18, height 5\' 3"  (1.6 m),  weight 65.885 kg (145 lb 4 oz), last menstrual period 09/25/2013.  Physical Exam  Nursing note and vitals reviewed. Constitutional: She is oriented to person, place, and time. She appears well-developed and well-nourished. No distress.  Cardiovascular: Normal rate.   Respiratory: Effort normal.  GI: Soft. She exhibits no distension and no mass. There is no tenderness. There is no rebound and no guarding.  Genitourinary:  Light bleeding on pad  Musculoskeletal: Normal range of motion.  Neurological: She is alert and oriented to person, place, and time.  Skin: Skin is warm and dry.  Psychiatric: She has a normal mood and affect.    MAU Course  Procedures  Results for [name] Perry, [name] (MRN [id number]) as of 12/13/2013 20:47  Ref. Range 12/12/2013 10:39 12/12/2013 10:40 12/12/2013 12:33 12/12/2013 14:17 12/13/2013 18:43  hCG, Beta Chain, Quant, S Latest Range: <5 mIU/mL 2494 (H)    2115 (H)    Results for orders placed during the hospital encounter of 12/13/13 (from the past 24 hour(s))  CBC     Status: None   Collection Time    12/13/13  6:43 PM      Result Value Ref Range   WBC 10.0  4.0 - 10.5 K/uL   RBC 4.36  3.87 - 5.11 MIL/uL   Hemoglobin 13.4  12.0 - 15.0 g/dL   HCT 13.038.3  86.536.0 - 78.446.0 %   MCV 87.8  78.0 - 100.0 fL   MCH 30.7  26.0 -  34.0 pg   MCHC 35.0  30.0 - 36.0 g/dL   RDW 16.1  09.6 - 04.5 %   Platelets 223  150 - 400 K/uL  HCG, QUANTITATIVE, PREGNANCY     Status: Abnormal   Collection Time    12/13/13  6:43 PM      Result Value Ref Range   hCG, Beta Chain, Quant, S 2115 (*) <5 mIU/mL   US Ob Comp Less 14 Wks  12/12/2013   CLINICAL DATA:  Pregnant, vaginal bleeding, beta HCG 2494  EXAM: OBSTETRIC <14 WK [place] AND TRANSVAGINAL OB US  TECHNIQUE: Both transabdominal and transvaginal ultrasound examinations were performed for complete evaluation of the gestation as well as the maternal uterus, adnexal regions, and pelvic cul-de-sac. Transvaginal technique was performed to  assess early pregnancy.  COMPARISON:  None.  FINDINGS: Intrauterine gestational sac: Not visualized  Maternal uterus/adnexae: Endometrial complex measures 8 mm. Scattered cystic foci at the junction of the endometrium and myometrium, without discrete gestational sac.  Left ovary measures 3.3 x 2.0 x 2.4 cm and is within normal limits.  Right ovary measures 6.1 x 1.5 x 4.1 cm. Associated complex cystic lesion appears intra-ovarian and is favored to reflect a corpus luteum (image 41). Adjacent 2.9 x 2.0 x 2.8 cm cystic lesion may be paraovarian (image 45) but does not demonstrate significant vascularity.  No free fluid.  IMPRESSION: No IUP is visualized.  By definition, this reflects a pregnancy of unknown location. Primary differential considerations include abnormal IUP/missed abortion versus nonvisualized ectopic. Early normal IUP is considered unlikely given the beta HCG of 2494.  Two right ovarian lesions are present, favored to reflect an intra-ovarian corpus luteum and a benign paraovarian cyst. Neither is specific for ectopic pregnancy, although this is difficult to exclude for the paraovarian lesion.  Correlate with serial beta HCG. If increasing, a right adnexal ectopic would be the diagnosis of exclusion. If decreasing, this likely reflects a missed abortion.   Electronically Signed   By: Charline Bills M.D.   On: 12/12/2013 13:04   US Ob Transvaginal  12/13/2013   CLINICAL DATA:  Increasing pain. Continued bleeding. Beta HCG 2,115 (2,494 on 03/21.  EXAM: TRANSVAGINAL OB ULTRASOUND  TECHNIQUE: Transvaginal ultrasound was performed for complete evaluation of the gestation as well as the maternal uterus, adnexal regions, and pelvic cul-de-sac.  COMPARISON:  12/12/2013 ultrasound  FINDINGS: Intrauterine gestational sac: Not visualized.  Maternal uterus/adnexae: The endometrium measures 7 mm in thickness.  Left ovary was unremarkable in appearance, measuring 2.6 x 1.5 x 1.5 cm. Right ovary again was seen  neck containing a complex cystic lesion, measuring 2.6 x 1.8 x 2.0 cm. An adjacent complex mass appeared to be separate and superomedial to the right ovary, measured 3.1 x 1.9 x 2.1 cm, and contained internal vascularity.  No free fluid was identified.  IMPRESSION: Complex right adnexal mass which appears separate from the ovary and has some internal vascularity. No intrauterine gestation is identified, and this is concerning for possible ectopic pregnancy.  Critical Value/emergent results were called by telephone at the time of interpretation on 12/13/2013 at 9:07 PM to Thressa Sheller, who verbally acknowledged these results.   Electronically Signed   By: Sebastian Ache   On: 12/13/2013 21:08   US Ob Transvaginal  12/12/2013   CLINICAL DATA:  Pregnant, vaginal bleeding, beta HCG 2494  EXAM: OBSTETRIC <14 WK [place] AND TRANSVAGINAL OB US  TECHNIQUE: Both transabdominal and transvaginal ultrasound examinations were performed for complete evaluation of  the gestation as well as the maternal uterus, adnexal regions, and pelvic cul-de-sac. Transvaginal technique was performed to assess early pregnancy.  COMPARISON:  None.  FINDINGS: Intrauterine gestational sac: Not visualized  Maternal uterus/adnexae: Endometrial complex measures 8 mm. Scattered cystic foci at the junction of the endometrium and myometrium, without discrete gestational sac.  Left ovary measures 3.3 x 2.0 x 2.4 cm and is within normal limits.  Right ovary measures 6.1 x 1.5 x 4.1 cm. Associated complex cystic lesion appears intra-ovarian and is favored to reflect a corpus luteum (image 41). Adjacent 2.9 x 2.0 x 2.8 cm cystic lesion may be paraovarian (image 45) but does not demonstrate significant vascularity.  No free fluid.  IMPRESSION: No IUP is visualized.  By definition, this reflects a pregnancy of unknown location. Primary differential considerations include abnormal IUP/missed abortion versus nonvisualized ectopic. Early normal IUP is considered  unlikely given the beta HCG of 2494.  Two right ovarian lesions are present, favored to reflect an intra-ovarian corpus luteum and a benign paraovarian cyst. Neither is specific for ectopic pregnancy, although this is difficult to exclude for the paraovarian lesion.  Correlate with serial beta HCG. If increasing, a right adnexal ectopic would be the diagnosis of exclusion. If decreasing, this likely reflects a missed abortion.   Electronically Signed   By: Charline Bills M.D.   On: 12/12/2013 13:04   2230: D/W the patient the R/B/A of giving MTX at this time. Interpreter used for the discussion. Patient is agreeable and deisres MTX.  2252: Dr. Emelda Fear on the unit to talk with the patient.   Assessment and Plan   Quant HCG slightly decreased, u/s pending Care assumed by Thressa Sheller, CNM  1. EP (ectopic pregnancy)    MTX given here in MAU Bleeding precautions Ectopic precautions Return to MAU on Wednesday 12/16/13  Follow-up Information   Follow up with [facility] [place] MATERNITY ADMISSIONS. (Wednesday 12/16/13 )    Contact information:   [address] [id number] [place] [address] [phone]            Tawnya Crook 12/13/2013, 8:48 PM

## 2013-12-13 NOTE — MAU Note (Signed)
Pt states via interpreter that she began bleeding Friday, [facility], was bleeding small amount when she first arrived then became heavier. Has more bleeding when sitting on commode. Small clots noted, nickel sized.

## 2013-12-13 NOTE — Progress Notes (Signed)
Hospital interpreter used.

## 2013-12-14 LAB — GC/CHLAMYDIA PROBE AMP
CT PROBE, AMP APTIMA: NEGATIVE
GC PROBE AMP APTIMA: NEGATIVE

## 2013-12-16 ENCOUNTER — Inpatient Hospital Stay (HOSPITAL_COMMUNITY)
Admission: AD | Admit: 2013-12-16 | Discharge: 2013-12-16 | Disposition: A | Discharge status: Home / Self Care | Payer: No Typology Code available for payment source | Source: Ambulatory Visit | Attending: Obstetrics & Gynecology | Admitting: Obstetrics & Gynecology

## 2013-12-16 DIAGNOSIS — O00109 Unspecified tubal pregnancy without intrauterine pregnancy: Secondary | ICD-10-CM | POA: Insufficient documentation

## 2013-12-16 LAB — HCG, QUANTITATIVE, PREGNANCY: hCG, Beta Chain, Quant, S: 1711 m[IU]/mL — ABNORMAL HIGH (ref <5)

## 2013-12-16 NOTE — MAU Provider Note (Signed)
S: 38 y.o. [id number] on Day 4 following MTX therapy for ectopic pregnancy presents for quantitative hcg.  She reports mild abdominal cramping today and denies vaginal bleeding. She reports 2 days of moderate vaginal bleeding following MTX administration that resolved yesterday.    Hospital Spanish translator used for all communication.   O: BP 136/93  Pulse 71  Temp(Src) 98 F (36.7 C) (Oral)  Resp 18  LMP 09/25/2013 Physical Examination: General appearance - alert, well appearing, and in no distress and acyanotic, in no respiratory distress  HCG, QUANTITATIVE, PREGNANCY     Status: Abnormal   Collection Time    12/12/13 10:39 AM      Result Value Ref Range   hCG, Beta Chain, Quant, S 2494 (*) <5 mIU/mL  WET PREP, GENITAL     Status: Abnormal   Collection Time    12/12/13  2:17 PM  HCG, QUANTITATIVE, PREGNANCY     Status: Abnormal   Collection Time    12/13/13  6:43 PM      Result Value Ref Range   hCG, Beta Chain, Quant, S 2115 (*) <5 mIU/mL  HCG, QUANTITATIVE, PREGNANCY     Status: Abnormal   Collection Time    12/16/13  9:14 AM      Result Value Ref Range   hCG, Beta Chain, Quant, S 1711 (*) <5 mIU/mL   A: Appropriate drop in quant hcg on Day 4 following MTX therapy  P: D/C home with ectopic and bleeding precautions Return on Saturday for Day 7 labs Return to MAU sooner as needed  Sharen CounterLisa Leftwich-Kirby Certified Nurse-Midwife

## 2013-12-16 NOTE — MAU Note (Signed)
Pt here for F/U BHCG post MTX.  C/O lower abd cramping, small amount of bleeding today.

## 2013-12-16 NOTE — MAU Note (Signed)
4 day s/p MTX

## 2013-12-19 ENCOUNTER — Inpatient Hospital Stay (HOSPITAL_COMMUNITY)
Admission: AD | Admit: 2013-12-19 | Discharge: 2013-12-19 | Disposition: A | Discharge status: Home / Self Care | Payer: No Typology Code available for payment source | Source: Ambulatory Visit | Attending: Obstetrics & Gynecology | Admitting: Obstetrics & Gynecology

## 2013-12-19 ENCOUNTER — Encounter (HOSPITAL_COMMUNITY): Payer: Self-pay

## 2013-12-19 DIAGNOSIS — O00109 Unspecified tubal pregnancy without intrauterine pregnancy: Secondary | ICD-10-CM | POA: Insufficient documentation

## 2013-12-19 DIAGNOSIS — O009 Unspecified ectopic pregnancy without intrauterine pregnancy: Secondary | ICD-10-CM

## 2013-12-19 DIAGNOSIS — K219 Gastro-esophageal reflux disease without esophagitis: Secondary | ICD-10-CM | POA: Insufficient documentation

## 2013-12-19 DIAGNOSIS — I10 Essential (primary) hypertension: Secondary | ICD-10-CM | POA: Insufficient documentation

## 2013-12-19 LAB — HCG, QUANTITATIVE, PREGNANCY: HCG, BETA CHAIN, QUANT, S: 1145 m[IU]/mL — AB (ref <5)

## 2013-12-19 NOTE — MAU Provider Note (Signed)
Attestation of Attending Supervision of Advanced Practitioner (PA/CNM/NP): Evaluation and management procedures were performed by the Advanced Practitioner under my supervision and collaboration.  I have reviewed the Advanced Practitioner's note and chart, and I agree with the management and plan.  Brentley Horrell, MD, FACOG Attending Obstetrician & Gynecologist Faculty Practice, [facility]  

## 2013-12-19 NOTE — MAU Provider Note (Signed)
  History     CSN: [id number]  Arrival date and time: 12/19/13 1003   None     Chief Complaint  Patient presents with  . Follow-up   HPI This is a 38 y.o. female who is Day 7 post Methotrexate presents for lab draw.  Reports no bleeding or cramping RN  Note: Patient to MAU for day 7 BHCG s/p MTX. Patient denies any abdominal pain or bleeding. States she has some burning around the mid abdomen that happens from her pants waistband.         OB History   Grav Para Term Preterm Abortions TAB SAB Ect Mult Living   5 2 2  2  2   2       Past Medical History  Diagnosis Date  . Hypertension   . GERD (gastroesophageal reflux disease)   . Anxiety     No past surgical history on file.  No family history on file.  History  Substance Use Topics  . Smoking status: Never Smoker   . Smokeless tobacco: Not on file  . Alcohol Use: No    Allergies: No Known Allergies  Prescriptions prior to admission  Medication Sig Dispense Refill  . hydrochlorothiazide (HYDRODIURIL) 25 MG tablet Take 25 mg by mouth daily.      . metroNIDAZOLE (FLAGYL) 500 MG tablet Take 1 tablet (500 mg total) by mouth 2 (two) times daily.  14 tablet  0  . Prenatal Vit-Fe Fumarate-FA (PRENATAL MULTIVITAMIN) TABS tablet Take 1 tablet by mouth daily at 12 noon.        Review of Systems  Constitutional: Negative for fever and malaise/fatigue.  Gastrointestinal: Negative for nausea, vomiting and abdominal pain.   Physical Exam   Blood pressure 135/88, pulse 74, temperature 98 F (36.7 C), temperature source Oral, resp. rate 16, last menstrual period 09/25/2013, SpO2 99.00%.  Physical Exam  Constitutional: She is oriented to person, place, and time. She appears well-developed and well-nourished. No distress.  Cardiovascular: Normal rate.   Respiratory: Effort normal.  Genitourinary:  Deferred  Musculoskeletal: Normal range of motion.  Neurological: She is alert and oriented to person, place, and time.   Skin: Skin is warm and dry.  Psychiatric: She has a normal mood and affect.    MAU Course  Procedures  MDM Results for Angelina OkMEJIAPEREZ, Demaya (MRN [id number]) as of 12/19/2013 11:09  Ref. Range 12/13/2013 18:43 12/13/2013 20:36 12/16/2013 09:14 12/19/2013 10:20  hCG, Beta Chain, Quant, S Latest Range: <5 mIU/mL 2115 (H)  1711 (H) 1145 (H)    Assessment and Plan  A:  Ectopic Pregnancy       Appropriately declining Quant HCG levels  P:  Discussed with Dr Macon LargeAnyanwu       Follow levels in clinic until 0       Appt made for 12/25/13        Ectopic precautions  [facility] ValleyWILLIAMS,Tino Ronan 12/19/2013, 10:41 AM

## 2013-12-19 NOTE — MAU Note (Signed)
Patient to MAU for day 7 BHCG s/p MTX. Patient denies any abdominal pain or bleeding. States she has some burning around the mid abdomen that happens from her pants waistband.

## 2013-12-19 NOTE — Discharge Instructions (Signed)
Tratamiento con metotrexato para Firefighterel embarazo ectpico (Methotrexate Treatment for an Ectopic Pregnancy) El Multimedia programmerembarazo ectpico se produce cuando el huevo fertilizado se adhiere (implanta) fuera del tero. La mayora de los embarazos ectpicos se producen en la trompa de [place]. Es raro que ocurran en el ovario, el intestino, la pelvis o el cuello uterino. Un embarazo ectpico no tiene la capacidad de llegar a ser un beb normal y sano. Puede ser Neomia Dearuna experiencia que pone en peligro la vida. Sin embargo, si se diagnostica precozmente, puede tratarse con un medicamento. Este medicamento es Air cabin crewel metotrexato. El metotrexato acta eficientemente impidiendo que el embarazo siga. Ayuda al organismo a absorber el tejido del Psychiatristembarazo en un perodo de 2 a 6 semanas (aunque la mayora de los embarazos sern absorbidos aproximadamente a las 3 semanas).  Si el metotrexato es Dorchesterefectivo, hay una buena probabilidad de que la trompa de Falopio puede salvarse. Independientemente de si la trompa de Falopio puede salvarse, una madre que ha tenido un Psychiatristembarazo ectpico tiene un riesgo mucho mayor de Warehouse managertener otro embarazo ectpico en los embarazos futuros. Neomia DearUna seria preocupacin es el desgarro potencial de la trompa de Falopio (ruptura). Si esto ocurre, es necesario realizar [place]inauna ciruga de emergencia para Office managerremover el embarazo, y el metotrexato no se puede Chemical engineerutilizar.  La paciente ideal para recibir Air cabin crewel metotrexato es aquella que:   No sufra una hemorragia interna.  No sienta dolor abdominal intenso o persistente.  Se haya comprometido a seguir adelante con las pruebas de laboratorio y Water quality scientistconcurrir a los controles hasta que el embarazo ectpico se haya absorbido.  Sea sana y tenga las funciones hepticas y renales normales en el momento de la evaluacin. El metotrexato no debe indicarse en mujeres que:   Estn amamantando.  Tener un embarazo normal (embarazo intrauterino).  Sufrir enfermedades hepticas, pulmonares o  renales.  Tienen problemas sanguneos.  Son alrgicas al Lockheed Martinmetotrexato.  Sufren de Office managerlcera pptica.  Tener un embarazo ectpico mayor de 1  pulgadas (3,5 cm) o en el que haya latidos cardacos fetales. Esta es una norma que se sigue la mayor parte del tiempo (contraindicacin [place] Victoryrelativa). ANTES DEL TRATAMIENTO  Antes de administrar el medicamento:  Le realizarn pruebas hepticas, renales y anlisis de sangre completo.  Los ARAMARK Corporationanlisis de sangre se Radiographer, therapeuticrealizan para Owens-Illinoismedir los niveles hormonales de Psychiatristembarazo y para Warehouse managerdeterminar el tipo sanguneo de la Butnermadre.  Si la mujer es Rh negativa y el padre es Rh positivo o su Rh es desconocido, le administrarn una inyeccin de RhoGAM. TRATAMIENTO Hay dos formas que puede indicar el mdico para usar Air cabin crewel metotrexato. Una forma consiste en una sola dosis o inyeccin del medicamento. La otra forma consiste en una serie de dosis. Este mtodo implica la aplicacin de varias inyecciones. Los ARAMARK Corporationanlisis de sangre se tomarn durante varias semanas para Harley-Davidsoncomprobar los niveles de la hormona del [place]embarazo. Los ARAMARK Corporationanlisis de sangre se Programme researcher, broadcasting/film/videorealizan hasta que no se detecta ms hormona del Arts development officerembarazo en la sangre.  DESPUS DEL TRATAMIENTO  Durante algunas semanas le harn anlisis de sangre para controlar los niveles de hormonas del Keatsembarazo. Los anlisis se indican hasta que no se detecte ms hormonas del Arts development officerembarazo en la sangre. Tambin existe riesgo de ruptura del embarazo ectpico cuando se Engineer, materialsutiliza el metotrexato. Este medicamento tambin puede causar Fort Lawnefectos adversos, que son:   Elliot Cousinuseas y vmitos.  Llagas en la boca.  Diarrea.  Erupcin.  Mareos.  Mayor dolor abdominal.  Aumento de la hemorragia vaginal.  Neumona.  Rayna SextonFracaso del tratamiento.  Prdida  del cabello (raro y reversible). En muy raras ocasiones puede afectar el recuento de glbulos rojos, el hgado, la mdula o los niveles de hormonas. Si esto sucede, Administrator, Civil Serviceel profesional necesitar realizarle otras evaluaciones.   Document Released: 06/20/2005 Document Revised: 12/03/2011 [facility] Patient Information 2014 [place]iaExitCare, [place]LLC.

## 2013-12-21 ENCOUNTER — Ambulatory Visit (INDEPENDENT_AMBULATORY_CARE_PROVIDER_SITE_OTHER): Payer: PRIVATE HEALTH INSURANCE | Admitting: General Surgery

## 2013-12-21 ENCOUNTER — Encounter (INDEPENDENT_AMBULATORY_CARE_PROVIDER_SITE_OTHER): Payer: Self-pay | Admitting: General Surgery

## 2013-12-21 VITALS — BP 132/80 | HR 78 | Temp 97.6°F | Resp 16 | Ht 67.5 in | Wt 147.6 lb

## 2013-12-21 DIAGNOSIS — K429 Umbilical hernia without obstruction or gangrene: Secondary | ICD-10-CM

## 2013-12-21 NOTE — Progress Notes (Signed)
Patient ID: [name] Perry, female   DOB: 05/23/1976, 38 y.o.   MRN: [id number]  Chief Complaint  Patient presents with  . New Evaluation    eval umb hernia    HPI [name] Perry is a 38 y.o. female.  The patient is a 38 -year-old Spanish-speaking female-speaking female who is referred for evaluation of an umbilical hernia. This states it's been there for approximately 5 years. She states it is becoming more painful since his period of time. He states it is painful when she bends over when her beltline rubs up against the hernia.  The patient said no signs or symptoms of incarceration or stimulation. HPI  Past Medical History  Diagnosis Date  . Hypertension   . GERD (gastroesophageal reflux disease)   . Anxiety     History reviewed. No pertinent past surgical history.  History reviewed. No pertinent family history.  Social History History  Substance Use Topics  . Smoking status: Never Smoker   . Smokeless tobacco: Not on file  . Alcohol Use: No    No Known Allergies  Current Outpatient Prescriptions  Medication Sig Dispense Refill  . hydrochlorothiazide (HYDRODIURIL) 25 MG tablet Take 25 mg by mouth daily.      . metroNIDAZOLE (FLAGYL) 500 MG tablet Take 1 tablet (500 mg total) by mouth 2 (two) times daily.  14 tablet  0  . Prenatal Vit-Fe Fumarate-FA (PRENATAL MULTIVITAMIN) TABS tablet Take 1 tablet by mouth daily at 12 noon.       No current facility-administered medications for this visit.    Review of Systems Review of Systems  Constitutional: Negative.   HENT: Negative.   Respiratory: Negative.   Cardiovascular: Negative.   Gastrointestinal: Negative.   Neurological: Negative.   All other systems reviewed and are negative.    Blood pressure 132/80, pulse 78, temperature 97.6 F (36.4 C), temperature source Temporal, resp. rate 16, height 5' 7.5" (1.715 m), weight 147 lb 9.6 oz (66.951 kg), last menstrual period 09/25/2013, unknown if currently  breastfeeding.  Physical Exam Physical Exam  Constitutional: She is oriented to person, place, and time. She appears well-developed and well-nourished.  HENT:  Head: Normocephalic and atraumatic.  Eyes: Conjunctivae and EOM are normal. Pupils are equal, round, and reactive to light.  Neck: Normal range of motion. Neck supple.  Cardiovascular: Normal rate, regular rhythm and normal heart sounds.   Pulmonary/Chest: Effort normal and breath sounds normal.  Abdominal: Soft. Bowel sounds are normal. She exhibits no distension and no mass. There is tenderness (umb). There is no rebound and no guarding. A hernia is present. Hernia confirmed positive in the ventral area.    Musculoskeletal: Normal range of motion.  Neurological: She is alert and oriented to person, place, and time.  Skin: Skin is warm and dry.  Psychiatric: She has a normal mood and affect.    Data Reviewed none  Assessment    38-year-old female with a reducible umbilical hernia     Plan    1. We'll proceed to the operating room for laparoscopic umbilical hernia repair with mesh. 2. All risks and benefits were discussed with the patient, to generally include infection, bleeding, damage to surrounding structures, acute and chronic nerve pain, and recurrence. Alternatives were offered and described.  All questions were answered and the patient voiced understanding of the procedure and wishes to proceed at this point.         Charita Lindenberger Jr., Kayden Hutmacher 12/21/2013, 2:51 PM    

## 2013-12-24 ENCOUNTER — Encounter (HOSPITAL_COMMUNITY): Payer: Self-pay | Admitting: Emergency Medicine

## 2013-12-24 ENCOUNTER — Emergency Department (HOSPITAL_COMMUNITY): Payer: No Typology Code available for payment source

## 2013-12-24 ENCOUNTER — Emergency Department (HOSPITAL_COMMUNITY)
Admission: EM | Admit: 2013-12-24 | Discharge: 2013-12-24 | Disposition: A | Discharge status: Home / Self Care | Payer: No Typology Code available for payment source | Attending: Emergency Medicine | Admitting: Emergency Medicine

## 2013-12-24 ENCOUNTER — Other Ambulatory Visit: Payer: No Typology Code available for payment source

## 2013-12-24 ENCOUNTER — Telehealth: Payer: Self-pay

## 2013-12-24 DIAGNOSIS — Z760 Encounter for issue of repeat prescription: Secondary | ICD-10-CM

## 2013-12-24 DIAGNOSIS — R002 Palpitations: Secondary | ICD-10-CM

## 2013-12-24 DIAGNOSIS — Z79899 Other long term (current) drug therapy: Secondary | ICD-10-CM | POA: Insufficient documentation

## 2013-12-24 DIAGNOSIS — Z8719 Personal history of other diseases of the digestive system: Secondary | ICD-10-CM | POA: Insufficient documentation

## 2013-12-24 DIAGNOSIS — E349 Endocrine disorder, unspecified: Secondary | ICD-10-CM

## 2013-12-24 DIAGNOSIS — F411 Generalized anxiety disorder: Secondary | ICD-10-CM | POA: Insufficient documentation

## 2013-12-24 DIAGNOSIS — M62838 Other muscle spasm: Secondary | ICD-10-CM | POA: Insufficient documentation

## 2013-12-24 DIAGNOSIS — I10 Essential (primary) hypertension: Secondary | ICD-10-CM | POA: Insufficient documentation

## 2013-12-24 LAB — PRO B NATRIURETIC PEPTIDE: Pro B Natriuretic peptide (BNP): 29.2 pg/mL (ref 0–125)

## 2013-12-24 LAB — CBC
HCT: 41.9 % (ref 36.0–46.0)
Hemoglobin: 14.8 g/dL (ref 12.0–15.0)
MCH: 30.9 pg (ref 26.0–34.0)
MCHC: 35.3 g/dL (ref 30.0–36.0)
MCV: 87.5 fL (ref 78.0–100.0)
Platelets: 270 10*3/uL (ref 150–400)
RBC: 4.79 MIL/uL (ref 3.87–5.11)
RDW: 13 % (ref 11.5–15.5)
WBC: 8.2 10*3/uL (ref 4.0–10.5)

## 2013-12-24 LAB — BASIC METABOLIC PANEL
BUN: 12 mg/dL (ref 6–23)
CALCIUM: 9.8 mg/dL (ref 8.4–10.5)
CO2: 25 mEq/L (ref 19–32)
CREATININE: 0.62 mg/dL (ref 0.50–1.10)
Chloride: 97 mEq/L (ref 96–112)
Glucose, Bld: 96 mg/dL (ref 70–99)
POTASSIUM: 3.7 meq/L (ref 3.7–5.3)
Sodium: 138 mEq/L (ref 137–147)

## 2013-12-24 LAB — HCG, QUANTITATIVE, PREGNANCY: HCG, BETA CHAIN, QUANT, S: 557.6 m[IU]/mL

## 2013-12-24 LAB — I-STAT TROPONIN, ED: Troponin i, poc: 0 ng/mL (ref 0.00–0.08)

## 2013-12-24 MED ORDER — HYDROCHLOROTHIAZIDE 25 MG PO TABS: 25.0000 mg | ORAL_TABLET | Freq: Every day | ORAL | Status: DC

## 2013-12-24 NOTE — Discharge Instructions (Signed)
Please follow up with your primary care physician in 1-2 days. If you do not have one please call the [facility] and wellness Center number listed above. Please follow up with Dr. Daleen Squibb at his clinic hours at the [facility]. Please take your blood pressure medication as prescribed. Please read all discharge instructions and return precautions.   Palpitaciones  (Palpitations)  Es la sensacin de sentir que el latido cardaco es irregular o es ms rpido que lo normal. Se siente como un aleteo o que falta un latido. Generalmente no es un problema grave. Sin embargo, en algunos casos podra ser necesario hacer ms estudios diagnsticos. CAUSAS  Las causas de las palpitaciones pueden ser:   El hbito de fumar.  El consumo de cafena u otros estimulantes, como pldoras para Geophysical data processor o bebidas energizantes.  El consumo de alcohol.  El estrs y la ansiedad.  La actividad fsica extenuante.  La fatiga.  Ciertos medicamentos.  Enfermedades cardacas, especialmente si tiene antecedentes de arritmias. Por ejemplo, fibrilacin articular, el aleteo auricular o la taquicardia supraventricular.  El uso incorrecto de un marcapasos o Biochemist, clinical. DIAGNSTICO  Para hallar la causa de las palpitaciones, el mdico le har una historia clnica y un examen fsico. Algunas pruebas pueden ser de ayuda para realizar el diagnstico y pueden ser:   Materials engineer (ECG). El electrocardiograma registra la actividad elctrica del corazn.  Monitoreo cardaco. Permite que el mdico controle la frecuencia y el ritmo cardaco en tiempo real.  Monitoreo Holter. Es un dispositivo porttil que eBay latidos cardacos y [place] a Education administrator las arritmias cardacas. Le permite al [employer] Express registrar la actividad cardaca durante varios das, si es necesario.  Pruebas de estrs por ejercicio o por medicamentos que aceleran los latidos cardacos. TRATAMIENTO  El tratamiento de las palpitaciones depende  de la causa de los sntomas y puede variar [place]. En la [employer] Machines no se requiere otro tratamiento que esperar, el relax y Air traffic controller de los sntomas. Otras causas, como la fibrilacin auricular, el aleteo auricular o la taquicardia supraventricular generalmente requieren Pharmacist, community.  INSTRUCCIONES PARA EL CUIDADO EN EL HOGAR   Evite:  La cafena que contienen el caf, el t, las [place], los diurticos y las bebidas energizantes.  El chocolate.  El consumo de alcohol.  Si fuma, abandone el hbito.  Reduzca los niveles de estrs y [place]. Los factores que pueden ayudarlo a Lexicographer son:  Un mtodo que mida las funciones del organismo de modo que pueda aprender a controlarlas (bioretroalimentacin).  El yoga.  La meditacin.  La actividad fsica como natacin, trote o caminatas.  Descanse y duerma lo suficiente. SOLICITE ATENCIN MDICA SI:   Contina con latidos cardacos rpidos o irregulares durante ms de 24 horas.  Las Smith International suceden con ms frecuencia. SOLICITE ATENCIN MDICA DE INMEDIATO SI:   Siente falta de aire o dolor en el pecho.  Sufre un dolor intenso de [place].  Se siente mareado o se desmaya. ASEGRESE DE QUE:   Comprende estas instrucciones.  Controlar su enfermedad.  Solicitar ayuda de inmediato si no mejora o empeora. Document Released: 06/20/2005 Document Revised: 01/05/2013 [facility] Patient Information 2014 [place], [place].  Hipertensin (Hypertension) Cuando el corazn late Johnson [employer] Johnson sangre a travs de las arterias. La fuerza que se origina es la presin arterial. Si la presin es demasiado elevada, se denomina hipertensin. El peligro radica en que puede sufrirla y no saberlo. Hipertensin puede significar que su corazn debe trabajar ms intensamente para bombear  sangre. Las arterias pueden Dietitianestar estrechas o rgidas. El Windhamtrabajo extra Lesothoaumenta el riesgo de enfermedades cardacas, ictus y Ecolabotros problemas.  La presin  arterial est formada por dos nmeros: el nmero mayor sobre el nmero menor, por ejemplo110/70. Se seala "110/72". Los valores ideales son por debajo de 120 para el nmero ms alto (sistlica) y por debajo de 80 para el ms bajo (diastlica). Anote su presin sangunea hoy. Debe prestar mucha atencin a su presin arterial si sufre alguna otra enfermedad como:  Insuficiencia cardaca  Ataques cardiacos previos  Diabetes  Enfermedad renal crnica  Ictus previo  Mltiples factores de riesgo para enfermedades cardacas Para diagnosticar si usted sufre hipertensin arterial, debe medirse la presin mientras encuentra sentado con el brazo elevado a la altura del nivel del corazn. Debe medirse al menos 2 veces. Una nica lectura de presin arterial elevada (especialmente en el servicio de emergencias) no significa que necesita tratamiento. Hay enfermedades en las que la presin arterial es diferente en ambos brazos. Es importante que consulte rpidamente con su mdico para un control. La Harley-Davidsonmayora de las personas sufren hipertensin esencial, lo que significa que no tiene una causa especfica. Este tipo de hipertensin puede bajarse modificando algunos factores en el estilo de vida como:  Librarian, academicstrs.  El consumo de cigarrillos.  La falta de actividad fsica.  Peso excesivo  Consumo de drogas y alcohol.  Consumiendo menos sal La mayora de las personas no tienen sntomas hasta que la hipertensin ocasiona un dao en el organismo. El tratamiento efectivo puede evitar, Designer, industrial/productdemorar o reducir ese dao. TRATAMIENTO: El tratamiento para la hipertensin, cuando se ha identificado una causa, est dirigido a la misma Hay un gran nmero en medicamentos para tratarla. Se agrupan en diferentes categoras y Media plannerel mdico seleccionar los medicamentos indicados para usted. Muchos medicamentos disponibles tienen efectos secundarios. Debe comentar los efectos secundarios con su mdico. Si la presin arterial permanece  elevada despus de modificar su estilo de vida o comenzar a tomar medicamentos:  Los medicamentos deben ser reemplazados  Puede ser necesario evaluar otros problemas.  Debe estar seguro que comprende las indicaciones, que sabe cmo y cundo Golden West Financialtomar los medicamentos.  Asegrese de Education officer, environmentalrealizar un control con su mdico dentro del tiempo indicado (generalmente dentro de las Marsh & McLennandos semanas) para volver a Systems analystevaluar la presin arterial y Dentistrevisar los medicamentos prescritos.  Si est tomando ms de un medicamento para la presin arterial, asegrese que sabe cmo y en qu momentos debe tomarlos. Tomar los medicamentos al mismo tiempo puede dar como resultado un gran descenso en la presin arterial. SOLICITE ATENCIN MDICA DE INMEDIATO SI PRESENTA:  Dolor de cabeza intenso, visin borrosa o cambios en la visin, o confusin.  Debilidad o adormecimientos inusuales o sensacin de desmayo.  Dolor de pecho o abdominal intenso, vmitos o problemas para respirar. ASEGRESE QUE:   Comprende estas instrucciones.  Controlar su enfermedad.  Solicitar ayuda inmediatamente si no mejora o si empeora. Document Released: 09/10/2005 Document Revised: 12/03/2011 [facility] Patient Information 2014 [place]ExitCare, [place]LLC.

## 2013-12-24 NOTE — Telephone Encounter (Signed)
Stat lab reported from [facility]: ACG quant 557.6. Dr. Debroah LoopArnold consulted and chart reviewed. Pt. To return in 7 days for repeat beta quant. Called pt. And informed her that pregnancy hormone is decreasing as we would want but she will need to return for a repeat blood draw next Thursday 12/31/13. Pt. States she can come at 0800. Informed pt. I will put her on the schedule.

## 2013-12-24 NOTE — ED Provider Notes (Signed)
CSN: [id number]     Arrival date & time 12/24/13  1402 History   First MD Initiated Contact with Patient 12/24/13 1548     Chief Complaint  Patient presents with  . Palpitations     (Consider location/radiation/quality/duration/timing/severity/associated sxs/prior Treatment) HPI Comments: Patient is a 38 year old female [id number] PMHx significant for an episode of high blood pressure while at [facility] for a beta hCG check. Patient states she had an episode of anxiousness with associated palpitations, she denies any chest pain or shortness of breath upon my interrogation of patient. She states she has a long-standing history of having palpitations when she feels anxious or nervous. She states she did not have any different associated symptoms with this episode. She states she is having a hernia repair performed by Dr. Derrell Lolling on April 20 and she has been very anxious about the surgery. She states that someone informed her on the hospital but her blood pressure was too elevated to safely have the surgery causing her more anxiety. It was 150/91. Patient is on chlorothiazide and has been taking this as prescribed, states she is due for a refill in one week. Patient states that she's had some right-sided neck tightness particularly waking up. She states it feels better when her husband massages the muscles. Denies any fevers, chills, nausea, vomiting, chest pain, shortness of breath. She states the abdominal cramping and bleeding are improving after the miscarriage. She is being followed for this at [facility] regularly. Beta hCGs are trending down appropriately.  Patient is a 38 y.o. female presenting with palpitations.  Palpitations Associated symptoms: no chest pain, no dizziness and no shortness of breath     Past Medical History  Diagnosis Date  . Hypertension   . GERD (gastroesophageal reflux disease)   . Anxiety    History reviewed. No pertinent past surgical history. History  reviewed. No pertinent family history. History  Substance Use Topics  . Smoking status: Never Smoker   . Smokeless tobacco: Not on file  . Alcohol Use: No   OB History   Grav Para Term Preterm Abortions TAB SAB Ect Mult Living   5 2 2  2  2   2      Review of Systems  Constitutional: Negative for fever and chills.  Respiratory: Negative for shortness of breath.   Cardiovascular: Positive for palpitations. Negative for chest pain.  Neurological: Negative for dizziness, syncope, weakness and light-headedness.  Psychiatric/Behavioral: The patient is nervous/anxious.   All other systems reviewed and are negative.      Allergies  Review of patient's allergies indicates no known allergies.  Home Medications   Current Outpatient Rx  Name  Route  Sig  Dispense  Refill  . Prenatal Vit-Fe Fumarate-FA (PRENATAL MULTIVITAMIN) TABS tablet   Oral   Take 1 tablet by mouth daily at 12 noon.         . hydrochlorothiazide (HYDRODIURIL) 25 MG tablet   Oral   Take 1 tablet (25 mg total) by mouth daily.   15 tablet   0    BP 125/82  Pulse 70  Temp(Src) 98.1 F (36.7 C) (Oral)  Resp 18  SpO2 100%  LMP 09/25/2013 Physical Exam  Nursing note and vitals reviewed. Constitutional: She is oriented to person, place, and time. She appears well-developed and well-nourished. No distress.  HENT:  Head: Normocephalic and atraumatic.  Right Ear: External ear normal.  Left Ear: External ear normal.  Nose: Nose normal.  Mouth/Throat: Oropharynx is clear and  moist. No oropharyngeal exudate.  Eyes: Conjunctivae are normal.  Neck: Neck supple.  Cardiovascular: Normal rate, regular rhythm, normal heart sounds and intact distal pulses.   Pulmonary/Chest: Effort normal and breath sounds normal. No respiratory distress. She has no wheezes. She has no rales. She exhibits no tenderness.  Abdominal: Soft. There is no tenderness.  Musculoskeletal:       Cervical back: She exhibits tenderness and  spasm. She exhibits normal range of motion, no bony tenderness, no swelling, no edema, no deformity, no laceration and no pain.       Back:  Neurological: She is alert and oriented to person, place, and time.  Skin: Skin is warm and dry. She is not diaphoretic.    ED Course  Procedures (including critical care time) Medications - No data to display  Labs Review Labs Reviewed  CBC  BASIC METABOLIC PANEL  PRO B NATRIURETIC PEPTIDE  I-STAT TROPOININ, ED   Imaging Review Dg Chest 2 View  12/24/2013   CLINICAL DATA:  Chest pain, palpitations, shortness of breath today  EXAM: CHEST  2 VIEW  COMPARISON:  Chest x-ray of 03/02/2012  FINDINGS: No active infiltrate or effusion is seen. A calcified granuloma in the right upper lung field appears stable. Mediastinal contours are stable. The heart is within normal limits in size. No bony abnormality is seen.  IMPRESSION: No active cardiopulmonary disease.   Electronically Signed   By: Dwyane DeePaul  Barry M.D.   On: 12/24/2013 16:47     EKG Interpretation   Date/Time:  Thursday December 24 2013 14:09:51 EDT Ventricular Rate:  82 PR Interval:  144 QRS Duration: 80 QT Interval:  406 QTC Calculation: 474 R Axis:   19 Text Interpretation:  Normal sinus rhythm Normal ECG No significant change  since last tracing Confirmed by ALLEN  MD, ANTHONY ([id number]) on 12/24/2013  5:26:54 PM      MDM   Final diagnoses:  Palpitations  Medication refill    Filed Vitals:   12/24/13 1615  BP: 125/82  Pulse: 70  Temp:   Resp: 18    Afebrile, NAD, non-toxic appearing, AAOx4. No complaints of CP, Chest tightness, or SOB in ED. I have reviewed nursing notes, vital signs, and all appropriate lab and imaging results for this patient.  1) Palpitations: Patient with long-standing history of palpitations and feeling nervous. Cardiac exam benign, normal RRR w/o MRG. EKG unremarkable. CXR. Beta hcg trend[facility] down [facility]   2) HTN: Patient  with history of HTN, on HCTZ. States she is due for a refill on her medication, will refill. No signs of hypertensive urgency.  Discussed with patient the need for close follow-up and management by their primary care physician.   Advised patient to f/u with [facility] office for possible holter monitoring. Advised PCP f/u to discuss HTN medications and further refills. Return precautions discussed. Patient is agreeable to plan. Patient is stable at time of discharge       Jeannetta EllisJennifer L Garrit Marrow, PA-C 12/24/13 1918

## 2013-12-24 NOTE — ED Notes (Signed)
Pt reports having a miscarriage on 3/25 and went to [facility] clinic today for hormone check and informed them she is having sob and palpitations, more severe when lying down at night. HR 82 at triage. No acute distress noted, ekg done.

## 2013-12-24 NOTE — Progress Notes (Signed)
Pt. Here today for follow up beta quant after miscarriage last week. Pt. States she has experienced intermittent  dizziness on a daily basis and SOB especially when lying down. Pt. Reports no bleeding (stopped last Wednesday) and [facility]. . BP obtained at 150/90.Denies headache, SOB, dizziness, abdominal pain at this time. Informed pt. Her BP is high, has she had BP issues in the past? Pt. Reports being told she has HTN. Informed pt. This is most likely the cause of her recent symptoms. Advised her to establish PCP, gave number to community [facility] wellness. Advised her to sleep with more pillows if necessary to elevate body to breathe easier, but re-itterated the importance of seeing PCP to obtain BP medications to decrease BP and informed her that management will allow her to feel much better. Informed her that if she experiences severe SOB, headache, dizziness, chest pain, abdominal pain, that she should immediately go to emergency room as these could be complications of high BP. Pt. Verbalizes understanding and gratitude and [facility]

## 2013-12-25 NOTE — ED Provider Notes (Signed)
Medical screening examination/treatment/procedure(s) were performed by non-physician practitioner and as supervising physician I was immediately available for consultation/collaboration.   Toy BakerAnthony T Edgar Corrigan, MD 12/25/13 [phone]

## 2013-12-30 ENCOUNTER — Encounter (HOSPITAL_COMMUNITY): Payer: Self-pay | Admitting: Pharmacy Technician

## 2013-12-31 ENCOUNTER — Other Ambulatory Visit: Payer: No Typology Code available for payment source

## 2013-12-31 DIAGNOSIS — E349 Endocrine disorder, unspecified: Secondary | ICD-10-CM

## 2014-01-01 LAB — HCG, QUANTITATIVE, PREGNANCY: hCG, Beta Chain, Quant, S: 181.5 m[IU]/mL

## 2014-01-04 ENCOUNTER — Encounter (HOSPITAL_COMMUNITY): Payer: Self-pay

## 2014-01-04 ENCOUNTER — Encounter (HOSPITAL_COMMUNITY)
Admission: RE | Admit: 2014-01-04 | Discharge: 2014-01-04 | Disposition: A | Discharge status: Home / Self Care | Payer: No Typology Code available for payment source | Source: Ambulatory Visit | Attending: General Surgery | Admitting: General Surgery

## 2014-01-04 DIAGNOSIS — Z01812 Encounter for preprocedural laboratory examination: Secondary | ICD-10-CM | POA: Insufficient documentation

## 2014-01-04 HISTORY — DX: Palpitations: R00.2

## 2014-01-04 LAB — BASIC METABOLIC PANEL
BUN: 11 mg/dL (ref 6–23)
CHLORIDE: 101 meq/L (ref 96–112)
CO2: 27 meq/L (ref 19–32)
Calcium: 9.9 mg/dL (ref 8.4–10.5)
Creatinine, Ser: 0.65 mg/dL (ref 0.50–1.10)
GFR calc Af Amer: >90 mL/min (ref >90)
GFR calc non Af Amer: >90 mL/min (ref >90)
Glucose, Bld: 115 mg/dL — ABNORMAL HIGH (ref 70–99)
POTASSIUM: 3.9 meq/L (ref 3.7–5.3)
Sodium: 141 mEq/L (ref 137–147)

## 2014-01-04 LAB — CBC
HEMATOCRIT: 38.8 % (ref 36.0–46.0)
HEMOGLOBIN: 13.3 g/dL (ref 12.0–15.0)
MCH: 30.3 pg (ref 26.0–34.0)
MCHC: 34.3 g/dL (ref 30.0–36.0)
MCV: 88.4 fL (ref 78.0–100.0)
Platelets: 248 10*3/uL (ref 150–400)
RBC: 4.39 MIL/uL (ref 3.87–5.11)
RDW: 13.1 % (ref 11.5–15.5)
WBC: 9 10*3/uL (ref 4.0–10.5)

## 2014-01-04 LAB — HCG, SERUM, QUALITATIVE: Preg, Serum: POSITIVE — AB

## 2014-01-04 LAB — HCG, QUANTITATIVE, PREGNANCY: hCG, Beta Chain, Quant, S: 93 m[IU]/mL — ABNORMAL HIGH (ref <5)

## 2014-01-04 NOTE — Pre-Procedure Instructions (Signed)
[name] DakinsFilomena Perry  01/04/2014   Your procedure is scheduled on:  Monday, April 20th  Report to Admitting at 0700 AM.  Call this number if you have problems the morning of surgery: [phone]   Remember:   Do not eat food or drink liquids after midnight.   Take these medicines the morning of surgery with A SIP OF WATER: none   Do not wear jewelry, make-up or nail polish.  Do not wear lotions, powders, or perfumes. You may wear deodorant.  Do not shave 48 hours prior to surgery. Men may shave face and neck.  Do not bring valuables to the hospital.  [facility] is not responsible  for any belongings or valuables.               Contacts, dentures or bridgework may not be worn into surgery.  Leave suitcase in the car. After surgery it may be brought to your room.  For patients admitted to the hospital, discharge time is determined by your treatment team.               Patients discharged the day of surgery will not be allowed to drive home.  Please read over the following fact sheets that you were given: Pain Booklet, Coughing and Deep Breathing and Surgical Site Infection Prevention [place] - Preparing for Surgery  Before surgery, you can play an important role.  Because skin is not sterile, your skin needs to be as free of germs as possible.  You can reduce the number of germs on you skin by washing with CHG (chlorahexidine gluconate) soap before surgery.  CHG is an antiseptic cleaner which kills germs and bonds with the skin to continue killing germs even after washing.  Please DO NOT use if you have an allergy to CHG or antibacterial soaps.  If your skin becomes reddened/irritated stop using the CHG and inform your nurse when you arrive at Short Stay.  Do not shave (including legs and underarms) for at least 48 hours prior to the first CHG shower.  You may shave your face.  Please follow these instructions carefully:   1.  Shower with CHG Soap the night before surgery and the  morning of Surgery.  2.  If you choose to wash your hair, wash your hair first as usual with your normal shampoo.  3.  After you shampoo, rinse your hair and body thoroughly to remove the shampoo.  4.  Use CHG as you would any other liquid soap.  You can apply CHG directly to the skin and wash gently with scrungie or a clean washcloth.  5.  Apply the CHG Soap to your body ONLY FROM THE NECK DOWN.  Do not use on open wounds or open sores.  Avoid contact with your eyes, ears, mouth and genitals (private parts).  Wash genitals (private parts) with your normal soap.  6.  Wash thoroughly, paying special attention to the area where your surgery will be performed.  7.  Thoroughly rinse your body with warm water from the neck down.  8.  DO NOT shower/wash with your normal soap after using and rinsing off the CHG Soap.  9.  Pat yourself dry with a clean towel.            10.  Wear clean pajamas.            11.  Place clean sheets on your bed the night of your first shower and do not sleep with pets.  Day of Surgery  Do not apply any lotions/deoderants the morning of surgery.  Please wear clean clothes to the hospital/surgery center.

## 2014-01-04 NOTE — Progress Notes (Addendum)
Primary physician - does not have. Uses emergency room if needed [employer]  house provides her medication  ekg and chest in epic from emergency room visit few weeks ago  Had ectopic pregnancy end of march was having hormone shots for this. Finished those  Interpreter was present for PAT

## 2014-01-05 NOTE — Progress Notes (Signed)
Anesthesia Chart Review:  Patient is a Spanish speaking female who is 38 years old and scheduled for laparoscopic umbilical hernia repair on 01/11/14 by Dr. Derrell Lollingamirez.  History includes HTN, anxiety, palpitations, non-smoker.  She was recently treated for an ectopic pregnancy on 12/13/13 with methotrexate. BP was 142/100 at PAT.  Unfortunately, it was not rechecked. Five previous readings since 12/16/13 show SBP 125-143/DBP 80-93.  She is on HCTZ.  EKG on 12/24/13 showed NSR.  CXR on 12/24/13 showed no active cardiopulmonary disease.  Preoperative labs noted.  Her qualitative beta hCG was positive, so I added a quantitative beta hCG which was elevated at 93 (previously 181.5 on 12/31/13) but was trending down since her ectopic pregnancy last month. I called and spoke with on-call OB-GYN Dr. Shawnie [facility]Pratt [facility] to notify her of plans for surgery and also verified that no further intervention was indicated preoperatively since her quantitative beta hCG levels continue to fall.  Velna Ochsllison Destry Dauber, PA-C [facility]/Anesthesiology Phone [phone] 01/05/2014 10:22 AM

## 2014-01-10 ENCOUNTER — Inpatient Hospital Stay (HOSPITAL_COMMUNITY)
Admission: AD | Admit: 2014-01-10 | Discharge: 2014-01-10 | Disposition: A | Discharge status: Home / Self Care | Payer: No Typology Code available for payment source | Source: Ambulatory Visit | Attending: Obstetrics & Gynecology | Admitting: Obstetrics & Gynecology

## 2014-01-10 DIAGNOSIS — O00109 Unspecified tubal pregnancy without intrauterine pregnancy: Secondary | ICD-10-CM | POA: Insufficient documentation

## 2014-01-10 DIAGNOSIS — Z09 Encounter for follow-up examination after completed treatment for conditions other than malignant neoplasm: Secondary | ICD-10-CM | POA: Insufficient documentation

## 2014-01-10 LAB — HCG, QUANTITATIVE, PREGNANCY: HCG, BETA CHAIN, QUANT, S: 91 m[IU]/mL — AB (ref <5)

## 2014-01-10 MED ORDER — CHLORHEXIDINE GLUCONATE 4 % EX LIQD
1.0000 "application " | Freq: Once | CUTANEOUS | Status: DC
  Filled 2014-01-10: qty 15

## 2014-01-10 MED ORDER — CEFAZOLIN SODIUM-DEXTROSE 2-3 GM-% IV SOLR: 2.0000 g | INTRAVENOUS | Status: AC

## 2014-01-10 NOTE — MAU Note (Signed)
Pt presents for repeat HCG. Denies any pain or vaginal bleeding

## 2014-01-10 NOTE — MAU Provider Note (Signed)
Attestation of Attending Supervision of Advanced Practitioner (CNM/NP): Evaluation and management procedures were performed by the Advanced Practitioner under my supervision and collaboration.  I have reviewed the Advanced Practitioner's note and chart, and I agree with the management and plan.  Dorris Vangorder Harraway-Smith 5:34 PM     

## 2014-01-10 NOTE — MAU Provider Note (Signed)
HPI:  Ms. Zachery DakinsFilomena Mejia-Perez is a 38 y.o. female who presents to MAU for a beta hcg level. The patient had an ectopic pregnancy in March and on 3/22 was treated with MTX.  The patient has been closely followed in MAU and the clinic with Beta hcg levels.  The patient is scheduled to have surgery tomorrow 4/20 for hernia repair. Her PCP recommended a beta hcg level prior to surgery tomorrow. Patient currently rates her pain 0/10, she denies bleeding.    Objective:  GENERAL: Well-developed, well-nourished female in no acute distress.  HEENT: Normocephalic, atraumatic.   LUNGS: Effort normal HEART: Regular rate  SKIN: Warm, dry and without erythema PSYCH: Normal mood and affect  Filed Vitals:   01/10/14 0948  BP: 151/98  Pulse: 69  Temp: 98.2 F (36.8 C)  Resp: 18   Results for orders placed during the hospital encounter of 01/10/14 (from the past 48 hour(s))  HCG, QUANTITATIVE, PREGNANCY     Status: Abnormal   Collection Time    01/10/14  9:30 AM      Result Value Ref Range   hCG, Beta Chain, Quant, S 91 (*) <5 mIU/mL   Comment:              GEST. AGE      CONC.  (mIU/mL)       <=1 WEEK        5 - 50         2 WEEKS       50 - 500         3 WEEKS       100 - 10,000         4 WEEKS     1,000 - 30,000         5 WEEKS     3,500 - 115,000       6-8 WEEKS     12,000 - 270,000        12 WEEKS     15,000 - 220,000                FEMALE AND NON-PREGNANT FEMALE:         LESS THAN 5 mIU/mL    MDM: Consulted with Dr. Erin FullingHarraway-Smith; patient to follow up in the clinic in 2 weeks for beta hcg level.  Previous quant on 4/13: 93  Assessment:  Lab assessment  Plan: Discharge home in stable condition Return to MAU with any pain or bleeding Ectopic precautions Pelvic rest Follow up in the clinic in 2 weeks; clinic will call you.     Iona HansenJennifer Irene Raiven Belizaire, NP 01/10/2014 11:03 AM

## 2014-01-10 NOTE — MAU Note (Signed)
Interpreter Missy SabinsJ. Rodriguez at bedside; per patient she is here for blood work only.

## 2014-01-11 ENCOUNTER — Ambulatory Visit (HOSPITAL_COMMUNITY)
Admission: RE | Admit: 2014-01-11 | Discharge: 2014-01-11 | Disposition: A | Discharge status: Home / Self Care | Payer: No Typology Code available for payment source | Source: Ambulatory Visit | Attending: General Surgery | Admitting: General Surgery

## 2014-01-11 ENCOUNTER — Encounter (HOSPITAL_COMMUNITY): Payer: Self-pay | Admitting: *Deleted

## 2014-01-11 ENCOUNTER — Encounter (HOSPITAL_COMMUNITY)
Admission: RE | Disposition: A | Discharge status: Home / Self Care | Payer: Self-pay | Source: Ambulatory Visit | Attending: General Surgery

## 2014-01-11 ENCOUNTER — Ambulatory Visit (HOSPITAL_COMMUNITY): Payer: No Typology Code available for payment source | Admitting: Anesthesiology

## 2014-01-11 ENCOUNTER — Encounter (HOSPITAL_COMMUNITY): Payer: No Typology Code available for payment source | Admitting: Vascular Surgery

## 2014-01-11 DIAGNOSIS — K219 Gastro-esophageal reflux disease without esophagitis: Secondary | ICD-10-CM | POA: Insufficient documentation

## 2014-01-11 DIAGNOSIS — I10 Essential (primary) hypertension: Secondary | ICD-10-CM | POA: Insufficient documentation

## 2014-01-11 DIAGNOSIS — K420 Umbilical hernia with obstruction, without gangrene: Secondary | ICD-10-CM

## 2014-01-11 DIAGNOSIS — F411 Generalized anxiety disorder: Secondary | ICD-10-CM | POA: Insufficient documentation

## 2014-01-11 HISTORY — PX: UMBILICAL HERNIA REPAIR: SHX196

## 2014-01-11 HISTORY — PX: INSERTION OF MESH: SHX5868

## 2014-01-11 SURGERY — REPAIR, HERNIA, UMBILICAL, LAPAROSCOPIC
Anesthesia: General | Site: Abdomen

## 2014-01-11 MED ORDER — BUPIVACAINE HCL 0.25 % IJ SOLN
INTRAMUSCULAR | Status: DC | PRN
  Administered 2014-01-11: 6 mL

## 2014-01-11 MED ORDER — HYDROMORPHONE HCL PF 1 MG/ML IJ SOLN
INTRAMUSCULAR | Status: AC
  Filled 2014-01-11: qty 1

## 2014-01-11 MED ORDER — HYDROMORPHONE HCL PF 1 MG/ML IJ SOLN
INTRAMUSCULAR | Status: DC
Start: 2014-01-11 — End: 2014-01-11
  Filled 2014-01-11: qty 1

## 2014-01-11 MED ORDER — ARTIFICIAL TEARS OP OINT
TOPICAL_OINTMENT | OPHTHALMIC | Status: DC | PRN
  Administered 2014-01-11: 1 via OPHTHALMIC

## 2014-01-11 MED ORDER — CEFAZOLIN SODIUM-DEXTROSE 2-3 GM-% IV SOLR
INTRAVENOUS | Status: DC | PRN
  Administered 2014-01-11: 2 g via INTRAVENOUS

## 2014-01-11 MED ORDER — CEFAZOLIN SODIUM-DEXTROSE 2-3 GM-% IV SOLR
INTRAVENOUS | Status: AC
  Filled 2014-01-11: qty 50

## 2014-01-11 MED ORDER — LACTATED RINGERS IV SOLN
INTRAVENOUS | Status: DC | PRN
  Administered 2014-01-11 (×2): via INTRAVENOUS

## 2014-01-11 MED ORDER — FENTANYL CITRATE 0.05 MG/ML IJ SOLN
INTRAMUSCULAR | Status: AC
  Filled 2014-01-11: qty 5

## 2014-01-11 MED ORDER — GLYCOPYRROLATE 0.2 MG/ML IJ SOLN
INTRAMUSCULAR | Status: DC | PRN
  Administered 2014-01-11: 0.6 mg via INTRAVENOUS

## 2014-01-11 MED ORDER — NEOSTIGMINE METHYLSULFATE 1 MG/ML IJ SOLN
INTRAMUSCULAR | Status: DC | PRN
  Administered 2014-01-11: 4 mg via INTRAVENOUS

## 2014-01-11 MED ORDER — BUPIVACAINE HCL (PF) 0.25 % IJ SOLN
INTRAMUSCULAR | Status: AC
  Filled 2014-01-11: qty 30

## 2014-01-11 MED ORDER — LACTATED RINGERS IV SOLN
INTRAVENOUS | Status: DC
  Administered 2014-01-11: 07:00:00 via INTRAVENOUS

## 2014-01-11 MED ORDER — 0.9 % SODIUM CHLORIDE (POUR BTL) OPTIME
TOPICAL | Status: DC | PRN
  Administered 2014-01-11: 1000 mL

## 2014-01-11 MED ORDER — PROPOFOL 10 MG/ML IV BOLUS
INTRAVENOUS | Status: DC | PRN
  Administered 2014-01-11: 170 mg via INTRAVENOUS

## 2014-01-11 MED ORDER — ROCURONIUM BROMIDE 50 MG/5ML IV SOLN
INTRAVENOUS | Status: AC
  Filled 2014-01-11: qty 1

## 2014-01-11 MED ORDER — OXYCODONE HCL 5 MG PO TABS
ORAL_TABLET | ORAL | Status: AC
  Filled 2014-01-11: qty 2

## 2014-01-11 MED ORDER — LIDOCAINE HCL (CARDIAC) 20 MG/ML IV SOLN
INTRAVENOUS | Status: AC
  Filled 2014-01-11: qty 10

## 2014-01-11 MED ORDER — LIDOCAINE HCL (CARDIAC) 20 MG/ML IV SOLN
INTRAVENOUS | Status: DC | PRN
  Administered 2014-01-11: 100 mg via INTRAVENOUS

## 2014-01-11 MED ORDER — FENTANYL CITRATE 0.05 MG/ML IJ SOLN
INTRAMUSCULAR | Status: DC | PRN
  Administered 2014-01-11 (×2): 75 ug via INTRAVENOUS

## 2014-01-11 MED ORDER — ONDANSETRON HCL 4 MG/2ML IJ SOLN
INTRAMUSCULAR | Status: AC
  Filled 2014-01-11: qty 2

## 2014-01-11 MED ORDER — PROPOFOL 10 MG/ML IV BOLUS
INTRAVENOUS | Status: AC
  Filled 2014-01-11: qty 20

## 2014-01-11 MED ORDER — OXYCODONE-ACETAMINOPHEN 5-325 MG PO TABS: 1.0000 | ORAL_TABLET | ORAL | Status: DC | PRN

## 2014-01-11 MED ORDER — ARTIFICIAL TEARS OP OINT
TOPICAL_OINTMENT | OPHTHALMIC | Status: AC
  Filled 2014-01-11: qty 3.5

## 2014-01-11 MED ORDER — HYDRALAZINE HCL 20 MG/ML IJ SOLN
INTRAMUSCULAR | Status: AC
  Filled 2014-01-11: qty 1

## 2014-01-11 MED ORDER — ONDANSETRON HCL 4 MG/2ML IJ SOLN: 4.0000 mg | Freq: Once | INTRAMUSCULAR | Status: DC | PRN

## 2014-01-11 MED ORDER — ROCURONIUM BROMIDE 100 MG/10ML IV SOLN
INTRAVENOUS | Status: DC | PRN
  Administered 2014-01-11: 5 mg via INTRAVENOUS
  Administered 2014-01-11: 25 mg via INTRAVENOUS

## 2014-01-11 MED ORDER — HYDROMORPHONE HCL PF 1 MG/ML IJ SOLN
0.2500 mg | INTRAMUSCULAR | Status: DC | PRN
  Administered 2014-01-11: 0.5 mg via INTRAVENOUS
  Administered 2014-01-11 (×2): 0.25 mg via INTRAVENOUS
  Administered 2014-01-11 (×2): 0.5 mg via INTRAVENOUS

## 2014-01-11 MED ORDER — OXYCODONE HCL 5 MG PO TABS
5.0000 mg | ORAL_TABLET | ORAL | Status: DC | PRN
  Administered 2014-01-11: 10 mg via ORAL

## 2014-01-11 MED ORDER — DEXAMETHASONE SODIUM PHOSPHATE 4 MG/ML IJ SOLN
INTRAMUSCULAR | Status: AC
  Filled 2014-01-11: qty 2

## 2014-01-11 SURGICAL SUPPLY — 56 items
APL SKNCLS STERI-STRIP NONHPOA (GAUZE/BANDAGES/DRESSINGS) ×1
APPLIER CLIP LOGIC TI 5 (MISCELLANEOUS) IMPLANT
APPLIER CLIP ROT 10 11.4 M/L (STAPLE)
APR CLP MED LRG 11.4X10 (STAPLE)
APR CLP MED LRG 33X5 (MISCELLANEOUS)
BENZOIN TINCTURE PRP APPL 2/3 (GAUZE/BANDAGES/DRESSINGS) ×3 IMPLANT
BLADE SURG ROTATE 9660 (MISCELLANEOUS) IMPLANT
CANISTER SUCTION 2500CC (MISCELLANEOUS) IMPLANT
CHLORAPREP W/TINT 26ML (MISCELLANEOUS) ×3 IMPLANT
CLIP APPLIE ROT 10 11.4 M/L (STAPLE) IMPLANT
CLOSURE WOUND 1/2 X4 (GAUZE/BANDAGES/DRESSINGS) ×1
COVER SURGICAL LIGHT HANDLE (MISCELLANEOUS) ×3 IMPLANT
DEVICE SECURE STRAP 25 ABSORB (INSTRUMENTS) ×3 IMPLANT
DEVICE TROCAR PUNCTURE CLOSURE (ENDOMECHANICALS) ×3 IMPLANT
DRAPE UTILITY 15X26 W/TAPE STR (DRAPE) ×6 IMPLANT
ELECT REM PT RETURN 9FT ADLT (ELECTROSURGICAL) ×3
ELECTRODE REM PT RTRN 9FT ADLT (ELECTROSURGICAL) ×1 IMPLANT
GAUZE SPONGE 2X2 8PLY STRL LF (GAUZE/BANDAGES/DRESSINGS) IMPLANT
GLOVE BIO SURGEON STRL SZ7.5 (GLOVE) ×5 IMPLANT
GLOVE BIOGEL PI IND STRL 7.0 (GLOVE) IMPLANT
GLOVE BIOGEL PI IND STRL 7.5 (GLOVE) IMPLANT
GLOVE BIOGEL PI INDICATOR 7.0 (GLOVE) ×2
GLOVE BIOGEL PI INDICATOR 7.5 (GLOVE) ×4
GLOVE ECLIPSE 7.5 STRL STRAW (GLOVE) ×2 IMPLANT
GOWN STRL REUS W/ TWL LRG LVL3 (GOWN DISPOSABLE) ×2 IMPLANT
GOWN STRL REUS W/ TWL XL LVL3 (GOWN DISPOSABLE) ×1 IMPLANT
GOWN STRL REUS W/TWL LRG LVL3 (GOWN DISPOSABLE) ×6
GOWN STRL REUS W/TWL XL LVL3 (GOWN DISPOSABLE) ×3
KIT BASIN OR (CUSTOM PROCEDURE TRAY) ×3 IMPLANT
KIT ROOM TURNOVER OR (KITS) ×3 IMPLANT
MARKER SKIN DUAL TIP RULER LAB (MISCELLANEOUS) ×3 IMPLANT
MESH PARIETEX 4.7 (Mesh General) ×2 IMPLANT
NDL INSUFFLATION 14GA 120MM (NEEDLE) ×1 IMPLANT
NDL SPNL 22GX3.5 QUINCKE BK (NEEDLE) IMPLANT
NEEDLE INSUFFLATION 14GA 120MM (NEEDLE) ×3 IMPLANT
NEEDLE SPNL 22GX3.5 QUINCKE BK (NEEDLE) IMPLANT
NS IRRIG 1000ML POUR BTL (IV SOLUTION) ×3 IMPLANT
PAD ARMBOARD 7.5X6 YLW CONV (MISCELLANEOUS) ×6 IMPLANT
SCISSORS LAP 5X35 DISP (ENDOMECHANICALS) ×3 IMPLANT
SET IRRIG TUBING LAPAROSCOPIC (IRRIGATION / IRRIGATOR) IMPLANT
SLEEVE ENDOPATH XCEL 5M (ENDOMECHANICALS) ×5 IMPLANT
SPONGE GAUZE 2X2 STER 10/PKG (GAUZE/BANDAGES/DRESSINGS) ×2
SPONGE GAUZE 4X4 12PLY (GAUZE/BANDAGES/DRESSINGS) ×3 IMPLANT
STRIP CLOSURE SKIN 1/2X4 (GAUZE/BANDAGES/DRESSINGS) ×1 IMPLANT
SUT CHROMIC 2 0 SH (SUTURE) ×3 IMPLANT
SUT MNCRL AB 4-0 PS2 18 (SUTURE) ×3 IMPLANT
SUT PROLENE 2 0 KS (SUTURE) ×2 IMPLANT
TAPE CLOTH SURG 4X10 WHT LF (GAUZE/BANDAGES/DRESSINGS) ×2 IMPLANT
TOWEL OR 17X24 6PK STRL BLUE (TOWEL DISPOSABLE) ×3 IMPLANT
TOWEL OR 17X26 10 PK STRL BLUE (TOWEL DISPOSABLE) ×3 IMPLANT
TOWEL OR NON WOVEN STRL DISP B (DISPOSABLE) ×2 IMPLANT
TRAY FOLEY CATH 14FR (SET/KITS/TRAYS/PACK) IMPLANT
TRAY LAPAROSCOPIC (CUSTOM PROCEDURE TRAY) ×3 IMPLANT
TROCAR XCEL BLUNT TIP 100MML (ENDOMECHANICALS) IMPLANT
TROCAR XCEL NON-BLD 11X100MML (ENDOMECHANICALS) IMPLANT
TROCAR XCEL NON-BLD 5MMX100MML (ENDOMECHANICALS) ×3 IMPLANT

## 2014-01-11 NOTE — Progress Notes (Signed)
MD notified of DBP still high, ordered hydralazine if stays above 100. Next BP <100 so no medication given.

## 2014-01-11 NOTE — Transfer of Care (Signed)
Immediate Anesthesia Transfer of Care Note  Patient: [name] Perry  Procedure(s) Performed: Procedure(s): LAPAROSCOPIC UMBILICAL HERNIA (N/A) INSERTION OF MESH (N/A)  Patient Location: PACU  Anesthesia Type:General  Level of Consciousness: awake, alert  and oriented  Airway & Oxygen Therapy: Patient Spontanous Breathing and Patient connected to nasal cannula oxygen  Post-op Assessment: Report given to PACU RN, Post -op Vital signs reviewed and stable and Patient moving all extremities X 4  Post vital signs: Reviewed and stable  Complications: No apparent anesthesia complications

## 2014-01-11 NOTE — Op Note (Signed)
01/11/2014  9:46 AM  PATIENT:  [name] DakinsFilomena Mejia-Perez  38 y.o. female  PRE-OPERATIVE DIAGNOSIS:  UMBILICAL HERNIA   POST-OPERATIVE DIAGNOSIS:  Incarcerated umbilical hernia  PROCEDURE:  Procedure(s): LAPAROSCOPIC UMBILICAL HERNIA (N/A) INSERTION OF MESH (N/A)  SURGEON:  Surgeon(s) and Role:    * Axel FillerArmando Odena Mcquaid, MD - Primary  ASSISTANTS: none   ANESTHESIA:   local and general  EBL:  Total I/O In: 1000 [I.V.:1000] Out: -   BLOOD ADMINISTERED:none  DRAINS: none   LOCAL MEDICATIONS USED:  BUPIVICAINE   SPECIMEN:  No Specimen  DISPOSITION OF SPECIMEN:  N/A  COUNTS:  YES  TOURNIQUET:  * No tourniquets in log *  DICTATION: .Dragon Dictation Details of the procedure:   After the patient was consented patient was taken back to the operating room patient was then placed in supine position bilateral SCDs in place. After antibiotics were confirmed a timeout was called and all facts were verified. The Veress needle technique was used to insuflate the abdomen at Palmer's point. The abdomen was insufflated to 14 mm mercury. Subsequently a 5 mm trocar was placed a camera inserted there was no injury to any intra-abdominal organs.  There was seen to be an incarcerated umbilical hernia.  A second camera port was in placed into the left lower quadrant.   At this the Falicform ligament was taken down  with Bovie cautery maintaining hemostasis. A 5mm port was placed in the epigastrium . I proceeded to reduce the hernia contents.  Once the hernia was cleared away, a Bard Ventralight 12cm  mesh was inserted into the abdomen.  The mesh was secured circumferentially with am Securestrap tacker in a double crown fashion.  The omentum was brought over the area of the mesh. The pneumoperitoneum was evacuated  & all trocars  were removed. The skin was reapproximated with 4-0  Monocryl sutures in a subcuticular fashion. The skin was dressed with Steri-Strips tape and gauze.  The patient was taken to the  recovery room in stable condition.   PLAN OF CARE: Discharge to home after PACU  PATIENT DISPOSITION:  PACU - hemodynamically stable.   Delay start of Pharmacological VTE agent (>24hrs) due to surgical blood loss or risk of bleeding: not applicable

## 2014-01-11 NOTE — Anesthesia Preprocedure Evaluation (Signed)
Anesthesia Evaluation  Patient identified by MRN, date of birth, ID band  Airway       Dental   Pulmonary          Cardiovascular hypertension, + dysrhythmias     Neuro/Psych    GI/Hepatic   Endo/Other    Renal/GU      Musculoskeletal   Abdominal   Peds  Hematology   Anesthesia Other Findings   Reproductive/Obstetrics                           Anesthesia Physical Anesthesia Plan  ASA: I  Anesthesia Plan: General   Post-op Pain Management:    Induction: Intravenous  Airway Management Planned: Oral ETT  Additional Equipment:   Intra-op Plan:   Post-operative Plan: Extubation in OR  Informed Consent: I have reviewed the patients History and Physical, chart, labs and discussed the procedure including the risks, benefits and alternatives for the proposed anesthesia with the patient or authorized representative who has indicated his/her understanding and acceptance.     Plan Discussed with:   Anesthesia Plan Comments:         Anesthesia Quick Evaluation

## 2014-01-11 NOTE — Anesthesia Postprocedure Evaluation (Signed)
  Anesthesia Post-op Note  Patient: [name] Perry  Procedure(s) Performed: Procedure(s): LAPAROSCOPIC UMBILICAL HERNIA (N/A) INSERTION OF MESH (N/A)  Patient Location: PACU  Anesthesia Type:General  Level of Consciousness: awake and alert   Airway and Oxygen Therapy: Patient Spontanous Breathing  Post-op Pain: mild  Post-op Assessment: Post-op Vital signs reviewed, Patient's Cardiovascular Status Stable, Respiratory Function Stable, Patent Airway, No signs of Nausea or vomiting and Pain level controlled  Post-op Vital Signs: Reviewed and stable  Last Vitals:  Filed Vitals:   01/11/14 1245  BP: 146/93  Pulse: 72  Temp:   Resp: 15    Complications: No apparent anesthesia complications

## 2014-01-11 NOTE — H&P (View-Only) (Signed)
Patient ID: [name] Perry, female   DOB: [birth year], 38 y.o.   MRN: [id number]  Chief Complaint  Patient presents with  . New Evaluation    eval umb hernia    HPI [name] Perry is a 38 y.o. female.  The patient is a 89 -year-old Spanish-speaking female who is referred for evaluation of an umbilical hernia. This states it's been there for approximately 5 years. She states it is becoming more painful since his period of time. He states it is painful when she bends over when her beltline rubs up against the hernia.  The patient said no signs or symptoms of incarceration or stimulation. HPI  Past Medical History  Diagnosis Date  . Hypertension   . GERD (gastroesophageal reflux disease)   . Anxiety     History reviewed. No pertinent past surgical history.  History reviewed. No pertinent family history.  Social History History  Substance Use Topics  . Smoking status: Never Smoker   . Smokeless tobacco: Not on file  . Alcohol Use: No    No Known Allergies  Current Outpatient Prescriptions  Medication Sig Dispense Refill  . hydrochlorothiazide (HYDRODIURIL) 25 MG tablet Take 25 mg by mouth daily.      . metroNIDAZOLE (FLAGYL) 500 MG tablet Take 1 tablet (500 mg total) by mouth 2 (two) times daily.  14 tablet  0  . Prenatal Vit-Fe Fumarate-FA (PRENATAL MULTIVITAMIN) TABS tablet Take 1 tablet by mouth daily at 12 noon.       No current facility-administered medications for this visit.    Review of Systems Review of Systems  Constitutional: Negative.   HENT: Negative.   Respiratory: Negative.   Cardiovascular: Negative.   Gastrointestinal: Negative.   Neurological: Negative.   All other systems reviewed and are negative.    Blood pressure 132/80, pulse 78, temperature 97.6 F (36.4 C), temperature source Temporal, resp. rate 16, height 5' 7.5" (1.715 m), weight 147 lb 9.6 oz (66.951 kg), last menstrual period 09/25/2013, unknown if currently  breastfeeding.  Physical Exam Physical Exam  Constitutional: She is oriented to person, place, and time. She appears well-developed and well-nourished.  HENT:  Head: Normocephalic and atraumatic.  Eyes: Conjunctivae and EOM are normal. Pupils are equal, round, and reactive to light.  Neck: Normal range of motion. Neck supple.  Cardiovascular: Normal rate, regular rhythm and normal heart sounds.   Pulmonary/Chest: Effort normal and breath sounds normal.  Abdominal: Soft. Bowel sounds are normal. She exhibits no distension and no mass. There is tenderness (umb). There is no rebound and no guarding. A hernia is present. Hernia confirmed positive in the ventral area.    Musculoskeletal: Normal range of motion.  Neurological: She is alert and oriented to person, place, and time.  Skin: Skin is warm and dry.  Psychiatric: She has a normal mood and affect.    Data Reviewed none  Assessment    38 year old female with a reducible umbilical hernia     Plan    1. We'll proceed to the operating room for laparoscopic umbilical hernia repair with mesh. 2. All risks and benefits were discussed with the patient, to generally include infection, bleeding, damage to surrounding structures, acute and chronic nerve pain, and recurrence. Alternatives were offered and described.  All questions were answered and the patient voiced understanding of the procedure and wishes to proceed at this point.         Marigene Ehlersamirez Jr., Fedra Lanter 12/21/2013, 2:51 PM

## 2014-01-11 NOTE — Anesthesia Procedure Notes (Signed)
Procedure Name: Intubation Date/Time: 01/11/2014 9:07 AM Performed by: Lanell MatarBAKER, Saryiah Bencosme M Pre-anesthesia Checklist: Patient identified, Timeout performed, Emergency Drugs available, Suction available and Patient being monitored Patient Re-evaluated:Patient Re-evaluated prior to inductionOxygen Delivery Method: Circle system utilized Preoxygenation: Pre-oxygenation with 100% oxygen Intubation Type: IV induction Ventilation: Mask ventilation without difficulty Laryngoscope Size: Miller and 2 Grade View: Grade I Tube type: Oral Tube size: 7.0 mm Number of attempts: 1 Airway Equipment and Method: Stylet Placement Confirmation: ETT inserted through vocal cords under direct vision,  breath sounds checked- equal and bilateral,  positive ETCO2 and CO2 detector Secured at: 22 cm Tube secured with: Tape Dental Injury: Teeth and Oropharynx as per pre-operative assessment

## 2014-01-11 NOTE — Discharge Instructions (Signed)
HERNIA REPAIR: POST OP INSTRUCTIONS  1. DIET: Follow a light bland diet the first 24 hours after arrival home, such as soup, liquids, crackers, etc.  Be sure to include lots of fluids daily.  Avoid fast food or heavy meals as your are more likely to get nauseated.  Eat a low fat the next few days after surgery. 2. Take your usually prescribed home medications unless otherwise directed. 3. PAIN CONTROL: a. Pain is best controlled by a usual combination of three different methods TOGETHER: i. Ice/Heat ii. Over the counter pain medication iii. Prescription pain medication b. Most patients will experience some swelling and bruising around the hernia(s) such as the bellybutton, groins, or old incisions.  Ice packs or heating pads (30-60 minutes up to 6 times a day) will help. Use ice for the first few days to help decrease swelling and bruising, then switch to heat to help relax tight/sore spots and speed recovery.  Some people prefer to use ice alone, heat alone, alternating between ice & heat.  Experiment to what works for you.  Swelling and bruising can take several weeks to resolve.   c. It is helpful to take an over-the-counter pain medication regularly for the first few weeks.  Choose one of the following that works best for you: i. Naproxen (Aleve, etc)  Two 220mg tabs twice a day ii. Ibuprofen (Advil, etc) Three 200mg tabs four times a day (every meal & bedtime) iii. Acetaminophen (Tylenol, etc) 325-650mg four times a day (every meal & bedtime) d. A  prescription for pain medication should be given to you upon discharge.  Take your pain medication as prescribed.  i. If you are having problems/concerns with the prescription medicine (does not control pain, nausea, vomiting, rash, itching, etc), please call us [phone] to see if we need to switch you to a different pain medicine that will work better for you and/or control your side effect better. ii. If you need a refill on your pain  medication, please contact your pharmacy.  They will contact our office to request authorization. Prescriptions will not be filled after 5 pm or on week-ends. 4. Avoid getting constipated.  Between the surgery and the pain medications, it is common to experience some constipation.  Increasing fluid intake and taking a fiber supplement (such as Metamucil, Citrucel, FiberCon, MiraLax, etc) 1-2 times a day regularly will usually help prevent this problem from occurring.  A mild laxative (prune juice, Milk of Magnesia, MiraLax, etc) should be taken according to package directions if there are no bowel movements after 48 hours.   5. Wash / shower every day.  You may shower over the dressings as they are waterproof.   6. Remove your waterproof bandages 5 days after surgery.  You may leave the incision open to air.  You may replace a dressing/Band-Aid to cover the incision for comfort if you wish.  Continue to shower over incision(s) after the dressing is off.    7. ACTIVITIES as tolerated:   a. You may resume regular (light) daily activities beginning the next day-such as daily self-care, walking, climbing stairs-gradually increasing activities as tolerated.  If you can walk 30 minutes without difficulty, it is safe to try more intense activity such as jogging, treadmill, bicycling, low-impact aerobics, swimming, etc. b. Save the most intensive and strenuous activity for last such as sit-ups, heavy lifting, contact sports, etc  Refrain from any heavy lifting or straining until you are off narcotics for pain control.     c. DO NOT PUSH THROUGH PAIN.  Let pain be your guide: If it hurts to do something, don't do it.  Pain is your body warning you to avoid that activity for another week until the pain goes down. d. You may drive when you are no longer taking prescription pain medication, you can comfortably wear a seatbelt, and you can safely maneuver your car and apply brakes. e. You may have sexual intercourse  when it is comfortable.  8. FOLLOW UP in our office a. Please call [facility] at [phone] to set up an appointment to see your surgeon in the office for a follow-up appointment approximately 2-3 weeks after your surgery. b. Make sure that you call for this appointment the day you arrive home to insure a convenient appointment time. 9.  IF YOU HAVE DISABILITY OR FAMILY LEAVE FORMS, BRING THEM TO THE OFFICE FOR PROCESSING.  DO NOT GIVE THEM TO YOUR DOCTOR.  WHEN TO CALL US [phone]: 1. Poor pain control 2. Reactions / problems with new medications (rash/itching, nausea, etc)  3. Fever over 101.5 F (38.5 C) 4. Inability to urinate 5. Nausea and/or vomiting 6. Worsening swelling or bruising 7. Continued bleeding from incision. 8. Increased pain, redness, or drainage from the incision   The clinic staff is available to answer your questions during regular business hours (8:30am-5pm).  Please don't hesitate to call and ask to speak to one of our nurses for clinical concerns.   If you have a medical emergency, go to the nearest emergency room or call 911.  A surgeon from [facility] is always on call at the hospitals in [place]  [facility] [address], [address], [place], [place]  [address] ?  [address] [address], [place], [place]   [address] MAIN: [phone] ? TOLL FREE: [phone] ? FAX: [phone] [email or web address]  

## 2014-01-11 NOTE — Interval H&P Note (Signed)
History and Physical Interval Note:  01/11/2014 7:17 AM  [name] Perry  has presented today for surgery, with the diagnosis of UMBILICAL HERNIA   The various methods of treatment have been discussed with the patient and family. After consideration of risks, benefits and other options for treatment, the patient has consented to  Procedure(s): LAPAROSCOPIC UMBILICAL HERNIA (N/A) INSERTION OF MESH (N/A) as a surgical intervention .  The patient's history has been reviewed, patient examined, no change in status, stable for surgery.  I have reviewed the patient's chart and labs.  Questions were answered to the patient's satisfaction.     Axel FillerArmando Cing 

## 2014-01-14 ENCOUNTER — Encounter (HOSPITAL_COMMUNITY): Payer: Self-pay | Admitting: General Surgery

## 2014-01-25 ENCOUNTER — Encounter (INDEPENDENT_AMBULATORY_CARE_PROVIDER_SITE_OTHER): Payer: Self-pay | Admitting: General Surgery

## 2014-01-25 ENCOUNTER — Other Ambulatory Visit: Payer: No Typology Code available for payment source

## 2014-01-25 ENCOUNTER — Ambulatory Visit (INDEPENDENT_AMBULATORY_CARE_PROVIDER_SITE_OTHER): Payer: PRIVATE HEALTH INSURANCE | Admitting: General Surgery

## 2014-01-25 VITALS — BP 118/78 | HR 71 | Temp 97.1°F | Resp 16 | Wt 144.8 lb

## 2014-01-25 DIAGNOSIS — Z3200 Encounter for pregnancy test, result unknown: Secondary | ICD-10-CM

## 2014-01-25 DIAGNOSIS — R300 Dysuria: Secondary | ICD-10-CM

## 2014-01-25 NOTE — Progress Notes (Signed)
Patient ID: [name] DakinsFilomena Perry, female   DOB: Feb 11, 1976, 38 y.o.   MRN: [id number] Post op course The patient is a 38 year old female status post laparoscopic umbilical hernia repair with mesh. Patient has been doing well postoperatively.  Patient has a new complaint of burning on urination.    On Exam: Wounds are clean dry and intact, there is no hernia on palpation   Assessment and Plan 38 year old female status post lap umbilical hernia repair 1. UA for possible UTI  2. Patient follow up as needed  2. Discuss with weightlifting restrictions for another month   Axel FillerArmando Clorene Nerio, MD [facility] Trauma & Emergency Surgery

## 2014-01-26 ENCOUNTER — Telehealth: Payer: Self-pay | Admitting: General Practice

## 2014-01-26 LAB — HCG, QUANTITATIVE, PREGNANCY: hCG, Beta Chain, Quant, S: 35.2 m[IU]/mL

## 2014-01-26 NOTE — Telephone Encounter (Signed)
Called patient with pacific interpreter [phone] and informed patient of results and need to come back for blood draw in 2 weeks. Patient verbalized understanding and stated she could come in 5/18 @ 9 for repeat hcg. Patient then stated that she started to lightly bleed recently and it was a little stringy. Told patient that was okay and it was probably just her body adjusting trying to go back to normal. Patient verbalized understanding and had no further questions

## 2014-01-26 NOTE — Telephone Encounter (Signed)
Message copied by Kathee DeltonHILLMAN, CARRIE L on Tue Jan 26, 2014 11:48 AM ------      Message from: Jaynie CollinsANYANWU, UGONNA A      Created: Tue Jan 26, 2014 11:28 AM       Further decrease in HCG. Needs repeat HCG in 2 weeks. ------

## 2014-02-08 ENCOUNTER — Other Ambulatory Visit: Payer: No Typology Code available for payment source

## 2014-02-09 ENCOUNTER — Other Ambulatory Visit: Payer: No Typology Code available for payment source

## 2014-02-09 DIAGNOSIS — O009 Unspecified ectopic pregnancy without intrauterine pregnancy: Secondary | ICD-10-CM

## 2014-02-10 ENCOUNTER — Telehealth: Payer: Self-pay | Admitting: General Practice

## 2014-02-10 LAB — HCG, QUANTITATIVE, PREGNANCY: hCG, Beta Chain, Quant, S: 3.2 m[IU]/mL

## 2014-02-10 NOTE — Telephone Encounter (Signed)
Patient came by front office window with raquel for interpreter and requested bhcg results from yesterday. Told patient they were less than 3 so she did not need further followup of those levels or by a provider unless she wanted to come in and talk about birth control or future pregnancies. Patient verbalized understanding and asked how long did she need to wait before getting pregnant again. Told patient she needed to wait at least 2-3 months before getting pregnant again. Patient verbalized understanding and had no further questions

## 2014-07-19 ENCOUNTER — Encounter (HOSPITAL_COMMUNITY): Payer: Self-pay | Admitting: Emergency Medicine

## 2014-07-19 ENCOUNTER — Emergency Department (HOSPITAL_COMMUNITY)
Admission: EM | Admit: 2014-07-19 | Discharge: 2014-07-20 | Disposition: A | Discharge status: Home / Self Care | Payer: Self-pay | Attending: Emergency Medicine | Admitting: Emergency Medicine

## 2014-07-19 DIAGNOSIS — Z8659 Personal history of other mental and behavioral disorders: Secondary | ICD-10-CM | POA: Insufficient documentation

## 2014-07-19 DIAGNOSIS — I10 Essential (primary) hypertension: Secondary | ICD-10-CM | POA: Insufficient documentation

## 2014-07-19 DIAGNOSIS — O26611 Liver and biliary tract disorders in pregnancy, first trimester: Secondary | ICD-10-CM

## 2014-07-19 DIAGNOSIS — O99611 Diseases of the digestive system complicating pregnancy, first trimester: Secondary | ICD-10-CM | POA: Insufficient documentation

## 2014-07-19 DIAGNOSIS — E876 Hypokalemia: Secondary | ICD-10-CM | POA: Insufficient documentation

## 2014-07-19 DIAGNOSIS — O99281 Endocrine, nutritional and metabolic diseases complicating pregnancy, first trimester: Secondary | ICD-10-CM | POA: Insufficient documentation

## 2014-07-19 DIAGNOSIS — Z79899 Other long term (current) drug therapy: Secondary | ICD-10-CM | POA: Insufficient documentation

## 2014-07-19 DIAGNOSIS — R1011 Right upper quadrant pain: Secondary | ICD-10-CM

## 2014-07-19 DIAGNOSIS — K8020 Calculus of gallbladder without cholecystitis without obstruction: Secondary | ICD-10-CM | POA: Insufficient documentation

## 2014-07-19 DIAGNOSIS — Z3A09 9 weeks gestation of pregnancy: Secondary | ICD-10-CM | POA: Insufficient documentation

## 2014-07-19 DIAGNOSIS — Z3490 Encounter for supervision of normal pregnancy, unspecified, unspecified trimester: Secondary | ICD-10-CM

## 2014-07-19 HISTORY — DX: Unspecified ectopic pregnancy without intrauterine pregnancy: O00.90

## 2014-07-19 LAB — COMPREHENSIVE METABOLIC PANEL
ALK PHOS: 83 U/L (ref 39–117)
ALT: 89 U/L — ABNORMAL HIGH (ref 0–35)
AST: 67 U/L — ABNORMAL HIGH (ref 0–37)
Albumin: 3.4 g/dL — ABNORMAL LOW (ref 3.5–5.2)
Anion gap: 13 (ref 5–15)
BUN: 12 mg/dL (ref 6–23)
CHLORIDE: 98 meq/L (ref 96–112)
CO2: 25 mEq/L (ref 19–32)
Calcium: 9.5 mg/dL (ref 8.4–10.5)
Creatinine, Ser: 0.64 mg/dL (ref 0.50–1.10)
GFR calc Af Amer: >90 mL/min (ref >90)
GFR calc non Af Amer: >90 mL/min (ref >90)
GLUCOSE: 104 mg/dL — AB (ref 70–99)
POTASSIUM: 3.2 meq/L — AB (ref 3.7–5.3)
Sodium: 136 mEq/L — ABNORMAL LOW (ref 137–147)
TOTAL PROTEIN: 7.8 g/dL (ref 6.0–8.3)
Total Bilirubin: <0.2 mg/dL — ABNORMAL LOW (ref 0.3–1.2)

## 2014-07-19 LAB — CBC
HCT: 35.9 % — ABNORMAL LOW (ref 36.0–46.0)
HEMOGLOBIN: 12.6 g/dL (ref 12.0–15.0)
MCH: 30.2 pg (ref 26.0–34.0)
MCHC: 35.1 g/dL (ref 30.0–36.0)
MCV: 86.1 fL (ref 78.0–100.0)
Platelets: 234 10*3/uL (ref 150–400)
RBC: 4.17 MIL/uL (ref 3.87–5.11)
RDW: 13.3 % (ref 11.5–15.5)
WBC: 9.4 10*3/uL (ref 4.0–10.5)

## 2014-07-19 LAB — URINALYSIS, ROUTINE W REFLEX MICROSCOPIC
Bilirubin Urine: NEGATIVE
Glucose, UA: NEGATIVE mg/dL
HGB URINE DIPSTICK: NEGATIVE
Ketones, ur: NEGATIVE mg/dL
Leukocytes, UA: NEGATIVE
Nitrite: NEGATIVE
Protein, ur: NEGATIVE mg/dL
SPECIFIC GRAVITY, URINE: 1.016 (ref 1.005–1.030)
Urobilinogen, UA: 0.2 mg/dL (ref 0.0–1.0)
pH: 7 (ref 5.0–8.0)

## 2014-07-19 LAB — HCG, QUANTITATIVE, PREGNANCY: HCG, BETA CHAIN, QUANT, S: 55266 m[IU]/mL — AB (ref <5)

## 2014-07-19 LAB — PREGNANCY, URINE: PREG TEST UR: POSITIVE — AB

## 2014-07-19 MED ORDER — ONDANSETRON HCL 4 MG/2ML IJ SOLN
4.0000 mg | Freq: Once | INTRAMUSCULAR | Status: AC
  Administered 2014-07-19: 4 mg via INTRAVENOUS
  Filled 2014-07-19: qty 2

## 2014-07-19 MED ORDER — SODIUM CHLORIDE 0.9 % IV BOLUS (SEPSIS)
1000.0000 mL | Freq: Once | INTRAVENOUS | Status: AC
  Administered 2014-07-19: 1000 mL via INTRAVENOUS

## 2014-07-19 NOTE — ED Provider Notes (Signed)
CSN: [id number]     Arrival date & time 07/19/14  2051 History   First MD Initiated Contact with Patient 07/19/14 2310     Chief Complaint  Patient presents with  . Abdominal Pain  . Nausea  . Emesis      Patient is a 38 y.o. female presenting with abdominal pain and vomiting. The history is provided by the patient. A language interpreter was used (nurse who is fluent in spanish).  Abdominal Pain Pain location:  RUQ Pain quality: sharp   Pain radiates to:  Back Pain severity:  Moderate Onset quality:  Sudden Duration:  5 hours Timing:  Intermittent Progression:  Improving Chronicity:  New Context: eating   Relieved by:  None tried Worsened by:  Movement and palpation Associated symptoms: dysuria, nausea and vomiting   Associated symptoms: no chest pain, no fever, no hematemesis, no vaginal bleeding and no vaginal discharge   Emesis Associated symptoms: abdominal pain   Patient presents for acute onset of RUQ pain that radiates to back after eating Now feeling improved No CP  She reports she is pregnant LMP - 05/08/14 No vag bleeding No low abdominal pain No prenatal care thus far   Past Medical History  Diagnosis Date  . Hypertension   . Anxiety   . Heart palpitations     anxiety related  . Ectopic pregnancy march   Past Surgical History  Procedure Laterality Date  . Umbilical hernia repair N/A 01/11/2014    Procedure: LAPAROSCOPIC UMBILICAL HERNIA;  Surgeon: Axel FillerArmando Ramirez, MD;  Location: [facility] OR;  Service: General;  Laterality: N/A;  . Insertion of mesh N/A 01/11/2014    Procedure: INSERTION OF MESH;  Surgeon: Axel FillerArmando Ramirez, MD;  [facility];  Service: General;  Laterality: N/A;   History reviewed. No pertinent family history. History  Substance Use Topics  . Smoking status: Never Smoker   . Smokeless tobacco: Not on file  . Alcohol Use: No   OB History   Grav Para Term Preterm Abortions TAB SAB Ect Mult Living   6 2 2  2  2   2      Review of  Systems  Constitutional: Negative for fever.  Cardiovascular: Negative for chest pain.  Gastrointestinal: Positive for nausea, vomiting and abdominal pain. Negative for hematemesis.  Genitourinary: Positive for dysuria. Negative for vaginal bleeding and vaginal discharge.  All other systems reviewed and are negative.     Allergies  Review of patient's allergies indicates no known allergies.  Home Medications   Prior to Admission medications   Medication Sig Start Date End Date Taking? Authorizing Provider  hydrochlorothiazide (HYDRODIURIL) 25 MG tablet Take 1 tablet (25 mg total) by mouth daily. 12/24/13  Yes Jennifer L Piepenbrink, PA-C  Prenatal Vit-Fe Fumarate-FA (PRENATAL MULTIVITAMIN) TABS tablet Take 1 tablet by mouth daily at 12 noon.   Yes Historical Provider, MD   BP 104/65  Pulse 68  Temp(Src) 98.3 F (36.8 C) (Oral)  Resp 17  Wt 152 lb (68.947 kg)  SpO2 98%  LMP 05/07/2014 Physical Exam CONSTITUTIONAL: Well developed/well nourished HEAD: Normocephalic/atraumatic EYES: EOMI/PERRL ENMT: Mucous membranes moist NECK: supple no meningeal signs SPINE:entire spine nontender CV: S1/S2 noted, no murmurs/rubs/gallops noted LUNGS: Lungs are clear to auscultation bilaterally, no apparent distress ABDOMEN: soft, moderate RUQ tenderness, no rebound or guarding GU:no cva tenderness NEURO: Pt is awake/alert, moves all extremitiesx4 EXTREMITIES: pulses normal, full ROM SKIN: warm, color normal PSYCH: no abnormalities of mood noted  ED Course  Procedures  EMERGENCY DEPARTMENT US PREGNANCY "Study: Limited Ultrasound of the Pelvis"  INDICATIONS:Pregnancy(required) Multiple views of the uterus and pelvic cavity are obtained with a multi-frequency probe.  APPROACH:Transabdominal   PERFORMED BY: Myself  IMAGES ARCHIVED?: Yes  LIMITATIONS: Body habitus and Emergent procedure  PREGNANCY FREE FLUID: None  PREGNANCY UTERUS FINDINGS:uterus enlarged   PREGNANCY  FINDINGS: Fetal heart activity seen  INTERPRETATION: Viable intrauterine pregnancy  GESTATIONAL AGE, ESTIMATE: 9 weeks     Labs Review Labs Reviewed  PREGNANCY, URINE - Abnormal; Notable for the following:    Preg Test, Ur POSITIVE (*)    All other components within normal limits  HCG, QUANTITATIVE, PREGNANCY - Abnormal; Notable for the following:    hCG, Beta Chain, Quant, S [id number] (*)    All other components within normal limits  CBC - Abnormal; Notable for the following:    HCT 35.9 (*)    All other components within normal limits  COMPREHENSIVE METABOLIC PANEL - Abnormal; Notable for the following:    Sodium 136 (*)    Potassium 3.2 (*)    Glucose, Bld 104 (*)    Albumin 3.4 (*)    AST 67 (*)    ALT 89 (*)    Total Bilirubin <0.2 (*)    All other components within normal limits  URINALYSIS, ROUTINE W REFLEX MICROSCOPIC - Abnormal; Notable for the following:    APPearance CLOUDY (*)    All other components within normal limits  LIPASE, BLOOD  TYPE AND SCREEN    Imaging Review Kore[place] Abdomen Complete  07/20/2014   CLINICAL DATA:  RIGHT upper quadrant pain. History of cholelithiasis.  EXAM: ULTRASOUND ABDOMEN COMPLETE  COMPARISON:  Abdominal ultrasound August 27, 2008  FINDINGS: Gallbladder: Multiple echogenic gallstones with acoustic shadowing measuring up to 17 mm. No gallbladder wall thickening, distention or pericholecystic fluid. No sonographic Murphy sign elicited.  Common bile duct: Diameter: 5 mm  Liver: Diffusely echogenic without intrahepatic biliary dilatation. Hepatopetal portal vein. No perihepatic free fluid.  IVC: No abnormality visualized.  Pancreas: Visualized portion unremarkable.  Spleen: Size and appearance within normal limits.  Right Kidney: Length: 11.7 cm. Echogenicity within normal limits. No mass or hydronephrosis visualized.  Left Kidney: Length: 11.6 cm. Echogenicity within normal limits. No mass or hydronephrosis visualized.  Abdominal aorta: No  aneurysm visualized.  Other findings: None.  IMPRESSION: Cholelithiasis without sonographic findings of acute cholecystitis.  Echogenic liver in keeping with hepatic steatosis.   Electronically Signed   By: Awilda Metroourtnay  Bloomer   On: 07/20/2014 01:19      11:45 PM Concern for biliary colic Will obtain US abdomen to evaluate for any GB pathology Pt has no low abd pain, no vag bleeding and bedside limited ED US reveals +IUP at approximately 9 weeks 1:39 AM Pt improved She is well appearing Will d/c home Discussed with dr Rowan Blasebyerley, surgery, we reviewed imaging/labs and that she is pregnant Will need f/u this week in clinic Discussed with patient strict return precautions   MDM   Final diagnoses:  RUQ pain  Pregnancy  Hypokalemia  Cholelithiasis affecting pregnancy in first trimester, antepartum    Nursing notes including past medical history and social history reviewed and considered in documentation Labs/vital reviewed and considered     Joya Gaskinsonald W Crescentia Boutwell, MD 07/20/14 0140

## 2014-07-19 NOTE — ED Notes (Addendum)
Pt presents with upper abd pain radiating around to her back, nausea and vomiting starting today. Pt states she is approx 3 months pregnant, last normal menstrual cycle is May 07, 2014. Pt denies any prenatal care. Pt has two children that were full term and no complications during delivery, pt also had an ectopic pregnancy March 2015. Pt described her pain as "air in her belly." Pt reports she vomited x2 prior to arrival, pt vomited approx 300 cc in triage, brown yellow color emesis. Pt speaks little english, interpreter used for triage

## 2014-07-20 ENCOUNTER — Emergency Department (HOSPITAL_COMMUNITY): Payer: No Typology Code available for payment source

## 2014-07-20 LAB — TYPE AND SCREEN
ABO/RH(D): O POS
ANTIBODY SCREEN: NEGATIVE

## 2014-07-20 LAB — LIPASE, BLOOD: LIPASE: 46 U/L (ref 11–59)

## 2014-07-20 MED ORDER — POTASSIUM CHLORIDE CRYS ER 20 MEQ PO TBCR
40.0000 meq | EXTENDED_RELEASE_TABLET | Freq: Once | ORAL | Status: AC
  Administered 2014-07-20: 40 meq via ORAL
  Filled 2014-07-20: qty 2

## 2014-07-20 NOTE — ED Notes (Signed)
Patient transported to Ultrasound 

## 2014-07-26 ENCOUNTER — Encounter (HOSPITAL_COMMUNITY): Payer: Self-pay | Admitting: Emergency Medicine

## 2014-08-24 LAB — OB RESULTS CONSOLE GC/CHLAMYDIA
CHLAMYDIA, DNA PROBE: NEGATIVE
Gonorrhea: NEGATIVE

## 2014-08-24 LAB — OB RESULTS CONSOLE HEPATITIS B SURFACE ANTIGEN: Hepatitis B Surface Ag: NEGATIVE

## 2014-08-24 LAB — OB RESULTS CONSOLE RPR: RPR: NONREACTIVE

## 2014-08-24 LAB — OB RESULTS CONSOLE RUBELLA ANTIBODY, IGM: RUBELLA: IMMUNE

## 2014-08-24 LAB — OB RESULTS CONSOLE ABO/RH: RH Type: POSITIVE

## 2014-08-24 LAB — OB RESULTS CONSOLE ANTIBODY SCREEN: Antibody Screen: NEGATIVE

## 2014-08-24 LAB — OB RESULTS CONSOLE HIV ANTIBODY (ROUTINE TESTING): HIV: NONREACTIVE

## 2014-09-09 ENCOUNTER — Other Ambulatory Visit (HOSPITAL_COMMUNITY): Payer: Self-pay | Admitting: Obstetrics

## 2014-09-09 DIAGNOSIS — O163 Unspecified maternal hypertension, third trimester: Secondary | ICD-10-CM

## 2014-09-09 DIAGNOSIS — K8020 Calculus of gallbladder without cholecystitis without obstruction: Secondary | ICD-10-CM

## 2014-09-09 DIAGNOSIS — Z3689 Encounter for other specified antenatal screening: Secondary | ICD-10-CM

## 2014-09-09 DIAGNOSIS — Z3A34 34 weeks gestation of pregnancy: Secondary | ICD-10-CM

## 2014-09-09 DIAGNOSIS — O09523 Supervision of elderly multigravida, third trimester: Secondary | ICD-10-CM

## 2014-09-22 ENCOUNTER — Ambulatory Visit (HOSPITAL_COMMUNITY): Admission: RE | Admit: 2014-09-22 | Payer: No Typology Code available for payment source | Source: Ambulatory Visit

## 2014-09-24 NOTE — L&D Delivery Note (Signed)
Delivery Note At 12:12 PM a viable female was delivered via Vaginal, Spontaneous Delivery (Presentation: Left Occiput Anterior).  APGAR: 9, ; weight 9 lb 13.7 oz (4470 g).   Placenta status: Intact, Spontaneous.  Cord: 3 vessels with the following complications: None.  Cord pH: not done  Anesthesia: None  Episiotomy: None Lacerations: 2nd degree Suture Repair: 2.0 Est. Blood Loss (mL):    Mom to postpartum.  Baby to Couplet care / Skin to Skin.  MARSHALL,BERNARD A 02/04/2015, 12:29 PM

## 2014-10-05 ENCOUNTER — Encounter (HOSPITAL_COMMUNITY): Payer: Self-pay

## 2014-10-05 ENCOUNTER — Other Ambulatory Visit (HOSPITAL_COMMUNITY): Payer: Self-pay | Admitting: Obstetrics and Gynecology

## 2014-10-05 ENCOUNTER — Ambulatory Visit (HOSPITAL_COMMUNITY)
Admission: RE | Admit: 2014-10-05 | Discharge: 2014-10-05 | Disposition: A | Discharge status: Home / Self Care | Payer: No Typology Code available for payment source | Source: Ambulatory Visit | Attending: Obstetrics | Admitting: Obstetrics

## 2014-10-05 ENCOUNTER — Other Ambulatory Visit (HOSPITAL_COMMUNITY): Payer: Self-pay | Admitting: Obstetrics

## 2014-10-05 DIAGNOSIS — O09529 Supervision of elderly multigravida, unspecified trimester: Secondary | ICD-10-CM | POA: Insufficient documentation

## 2014-10-05 DIAGNOSIS — Z3689 Encounter for other specified antenatal screening: Secondary | ICD-10-CM | POA: Insufficient documentation

## 2014-10-05 DIAGNOSIS — O09523 Supervision of elderly multigravida, third trimester: Secondary | ICD-10-CM

## 2014-10-05 DIAGNOSIS — K8020 Calculus of gallbladder without cholecystitis without obstruction: Secondary | ICD-10-CM

## 2014-10-05 DIAGNOSIS — Z3A34 34 weeks gestation of pregnancy: Secondary | ICD-10-CM

## 2014-10-05 DIAGNOSIS — O163 Unspecified maternal hypertension, third trimester: Secondary | ICD-10-CM | POA: Insufficient documentation

## 2014-10-05 DIAGNOSIS — Z3A21 21 weeks gestation of pregnancy: Secondary | ICD-10-CM | POA: Insufficient documentation

## 2014-10-05 DIAGNOSIS — O09522 Supervision of elderly multigravida, second trimester: Secondary | ICD-10-CM | POA: Insufficient documentation

## 2014-10-05 DIAGNOSIS — O10012 Pre-existing essential hypertension complicating pregnancy, second trimester: Secondary | ICD-10-CM | POA: Insufficient documentation

## 2014-10-05 NOTE — ED Notes (Signed)
Spanish interpreter [name] Perry with pt.

## 2014-11-02 ENCOUNTER — Ambulatory Visit (HOSPITAL_COMMUNITY)
Admission: RE | Admit: 2014-11-02 | Discharge: 2014-11-02 | Disposition: A | Discharge status: Home / Self Care | Payer: No Typology Code available for payment source | Source: Ambulatory Visit | Attending: Obstetrics | Admitting: Obstetrics

## 2014-11-02 DIAGNOSIS — Z36 Encounter for antenatal screening of mother: Secondary | ICD-10-CM | POA: Insufficient documentation

## 2014-11-02 DIAGNOSIS — Z3A34 34 weeks gestation of pregnancy: Secondary | ICD-10-CM

## 2014-11-02 DIAGNOSIS — K8020 Calculus of gallbladder without cholecystitis without obstruction: Secondary | ICD-10-CM

## 2014-11-02 DIAGNOSIS — O10012 Pre-existing essential hypertension complicating pregnancy, second trimester: Secondary | ICD-10-CM | POA: Insufficient documentation

## 2014-11-02 DIAGNOSIS — Z3A25 25 weeks gestation of pregnancy: Secondary | ICD-10-CM | POA: Insufficient documentation

## 2014-11-02 DIAGNOSIS — O09523 Supervision of elderly multigravida, third trimester: Secondary | ICD-10-CM

## 2014-11-02 DIAGNOSIS — Z3689 Encounter for other specified antenatal screening: Secondary | ICD-10-CM

## 2014-11-02 DIAGNOSIS — O163 Unspecified maternal hypertension, third trimester: Secondary | ICD-10-CM

## 2014-11-02 DIAGNOSIS — O09522 Supervision of elderly multigravida, second trimester: Secondary | ICD-10-CM | POA: Insufficient documentation

## 2014-11-22 ENCOUNTER — Ambulatory Visit: Payer: No Typology Code available for payment source

## 2014-12-01 ENCOUNTER — Ambulatory Visit (HOSPITAL_COMMUNITY)
Admission: RE | Admit: 2014-12-01 | Discharge: 2014-12-01 | Disposition: A | Discharge status: Home / Self Care | Payer: No Typology Code available for payment source | Source: Ambulatory Visit | Attending: Obstetrics | Admitting: Obstetrics

## 2014-12-01 ENCOUNTER — Other Ambulatory Visit (HOSPITAL_COMMUNITY): Payer: Self-pay | Admitting: Maternal and Fetal Medicine

## 2014-12-01 ENCOUNTER — Encounter (HOSPITAL_COMMUNITY): Payer: Self-pay

## 2014-12-01 DIAGNOSIS — Z3A29 29 weeks gestation of pregnancy: Secondary | ICD-10-CM | POA: Insufficient documentation

## 2014-12-01 DIAGNOSIS — O163 Unspecified maternal hypertension, third trimester: Secondary | ICD-10-CM

## 2014-12-01 DIAGNOSIS — O09523 Supervision of elderly multigravida, third trimester: Secondary | ICD-10-CM

## 2014-12-01 DIAGNOSIS — O99613 Diseases of the digestive system complicating pregnancy, third trimester: Secondary | ICD-10-CM | POA: Insufficient documentation

## 2014-12-01 DIAGNOSIS — O10013 Pre-existing essential hypertension complicating pregnancy, third trimester: Secondary | ICD-10-CM | POA: Insufficient documentation

## 2014-12-01 DIAGNOSIS — K8020 Calculus of gallbladder without cholecystitis without obstruction: Secondary | ICD-10-CM | POA: Insufficient documentation

## 2014-12-15 ENCOUNTER — Ambulatory Visit: Payer: No Typology Code available for payment source

## 2014-12-17 ENCOUNTER — Encounter (HOSPITAL_COMMUNITY): Payer: Self-pay

## 2014-12-17 ENCOUNTER — Ambulatory Visit (HOSPITAL_COMMUNITY)
Admission: RE | Admit: 2014-12-17 | Discharge: 2014-12-17 | Disposition: A | Discharge status: Home / Self Care | Payer: No Typology Code available for payment source | Source: Ambulatory Visit | Attending: Obstetrics | Admitting: Obstetrics

## 2014-12-17 ENCOUNTER — Other Ambulatory Visit (HOSPITAL_COMMUNITY): Payer: Self-pay | Admitting: Obstetrics

## 2014-12-17 DIAGNOSIS — Z3A32 32 weeks gestation of pregnancy: Secondary | ICD-10-CM | POA: Insufficient documentation

## 2014-12-17 DIAGNOSIS — O10013 Pre-existing essential hypertension complicating pregnancy, third trimester: Secondary | ICD-10-CM | POA: Insufficient documentation

## 2014-12-17 DIAGNOSIS — O09523 Supervision of elderly multigravida, third trimester: Secondary | ICD-10-CM | POA: Insufficient documentation

## 2014-12-17 DIAGNOSIS — O163 Unspecified maternal hypertension, third trimester: Secondary | ICD-10-CM

## 2014-12-17 HISTORY — DX: Gestational diabetes mellitus in pregnancy, unspecified control: O24.419

## 2014-12-21 ENCOUNTER — Other Ambulatory Visit (HOSPITAL_COMMUNITY): Payer: Self-pay | Admitting: Obstetrics

## 2014-12-21 ENCOUNTER — Ambulatory Visit (HOSPITAL_COMMUNITY)
Admission: RE | Admit: 2014-12-21 | Discharge: 2014-12-21 | Disposition: A | Discharge status: Home / Self Care | Payer: No Typology Code available for payment source | Source: Ambulatory Visit | Attending: Obstetrics | Admitting: Obstetrics

## 2014-12-21 ENCOUNTER — Encounter (HOSPITAL_COMMUNITY): Payer: Self-pay

## 2014-12-21 DIAGNOSIS — O09529 Supervision of elderly multigravida, unspecified trimester: Secondary | ICD-10-CM | POA: Insufficient documentation

## 2014-12-21 DIAGNOSIS — O09523 Supervision of elderly multigravida, third trimester: Secondary | ICD-10-CM | POA: Insufficient documentation

## 2014-12-21 DIAGNOSIS — O10919 Unspecified pre-existing hypertension complicating pregnancy, unspecified trimester: Secondary | ICD-10-CM | POA: Insufficient documentation

## 2014-12-23 ENCOUNTER — Encounter: Payer: Self-pay | Attending: Obstetrics | Admitting: *Deleted

## 2014-12-23 ENCOUNTER — Ambulatory Visit (HOSPITAL_COMMUNITY)
Admission: RE | Admit: 2014-12-23 | Discharge: 2014-12-23 | Disposition: A | Discharge status: Home / Self Care | Payer: No Typology Code available for payment source | Source: Ambulatory Visit | Attending: Obstetrics | Admitting: Obstetrics

## 2014-12-23 DIAGNOSIS — Z713 Dietary counseling and surveillance: Secondary | ICD-10-CM | POA: Insufficient documentation

## 2014-12-23 DIAGNOSIS — O24419 Gestational diabetes mellitus in pregnancy, unspecified control: Secondary | ICD-10-CM

## 2014-12-23 NOTE — Progress Notes (Signed)
  Patient was seen on 12/23/14 for Gestational Diabetes self-management . The following learning objectives were met by the patient :   States the definition of Gestational Diabetes  States why dietary management is important in controlling blood glucose  Describes the effects of carbohydrates on blood glucose levels  Demonstrates ability to create a balanced meal plan  Demonstrates carbohydrate counting   States when to check blood glucose levels  Demonstrates proper blood glucose monitoring techniques  Plan:  Aim for 2 Carb Choices per meal (30 grams) +/- 1 either way for breakfast Aim for 3 Carb Choices per meal (45 grams) +/- 1 either way from lunch and dinner Aim for 1-2 Carbs per snack Begin reading food labels for Total Carbohydrate and sugar grams of foods Consider  increasing your activity level by walking daily as tolerated Begin checking BG before breakfast and 1-2 hours after first bit of breakfast, lunch and dinner after  as directed by MD  Take medication  as directed by MD  Blood glucose monitor recommended; [employer] Reli-On Ultima, test strips and lancets. Demonstration provided for use of meter  Patient instructed to monitor glucose levels: FBS: 60 - <90 2 hour: <120  Patient received the following handouts:  Carbohydrate Counting List  Patient will be seen for follow-up as needed.

## 2014-12-23 NOTE — Progress Notes (Signed)
Wilma Stochton provided interpret services for this encounter.

## 2014-12-23 NOTE — Addendum Note (Signed)
Encounter addended by: Rosendo GrosNancy L Lachele Lievanos, RN on: 12/23/2014  5:16 PM<BR>     Documentation filed: Notes Section

## 2014-12-24 ENCOUNTER — Ambulatory Visit (HOSPITAL_COMMUNITY)
Admission: RE | Admit: 2014-12-24 | Discharge: 2014-12-24 | Disposition: A | Discharge status: Home / Self Care | Payer: No Typology Code available for payment source | Source: Ambulatory Visit | Attending: Obstetrics | Admitting: Obstetrics

## 2014-12-24 ENCOUNTER — Encounter (HOSPITAL_COMMUNITY): Payer: Self-pay

## 2014-12-24 DIAGNOSIS — O24410 Gestational diabetes mellitus in pregnancy, diet controlled: Secondary | ICD-10-CM | POA: Insufficient documentation

## 2014-12-24 DIAGNOSIS — O09523 Supervision of elderly multigravida, third trimester: Secondary | ICD-10-CM

## 2014-12-24 DIAGNOSIS — O163 Unspecified maternal hypertension, third trimester: Secondary | ICD-10-CM

## 2014-12-24 DIAGNOSIS — O10013 Pre-existing essential hypertension complicating pregnancy, third trimester: Secondary | ICD-10-CM | POA: Insufficient documentation

## 2014-12-24 DIAGNOSIS — Z3A33 33 weeks gestation of pregnancy: Secondary | ICD-10-CM | POA: Insufficient documentation

## 2014-12-28 ENCOUNTER — Other Ambulatory Visit (HOSPITAL_COMMUNITY): Payer: No Typology Code available for payment source

## 2014-12-28 ENCOUNTER — Encounter (HOSPITAL_COMMUNITY): Payer: Self-pay

## 2014-12-28 ENCOUNTER — Other Ambulatory Visit (HOSPITAL_COMMUNITY): Payer: Self-pay | Admitting: Obstetrics

## 2014-12-28 ENCOUNTER — Ambulatory Visit (HOSPITAL_COMMUNITY)
Admission: RE | Admit: 2014-12-28 | Discharge: 2014-12-28 | Disposition: A | Discharge status: Home / Self Care | Payer: No Typology Code available for payment source | Source: Ambulatory Visit | Attending: Obstetrics | Admitting: Obstetrics

## 2014-12-28 VITALS — BP 103/66 | HR 67 | Wt 167.0 lb

## 2014-12-28 DIAGNOSIS — O10013 Pre-existing essential hypertension complicating pregnancy, third trimester: Secondary | ICD-10-CM | POA: Insufficient documentation

## 2014-12-28 DIAGNOSIS — O09523 Supervision of elderly multigravida, third trimester: Secondary | ICD-10-CM

## 2014-12-28 DIAGNOSIS — O24410 Gestational diabetes mellitus in pregnancy, diet controlled: Secondary | ICD-10-CM

## 2014-12-28 DIAGNOSIS — Z3A33 33 weeks gestation of pregnancy: Secondary | ICD-10-CM | POA: Insufficient documentation

## 2014-12-28 DIAGNOSIS — O163 Unspecified maternal hypertension, third trimester: Secondary | ICD-10-CM

## 2014-12-30 ENCOUNTER — Ambulatory Visit (HOSPITAL_COMMUNITY): Payer: No Typology Code available for payment source

## 2014-12-31 ENCOUNTER — Ambulatory Visit (HOSPITAL_COMMUNITY)
Admission: RE | Admit: 2014-12-31 | Discharge: 2014-12-31 | Disposition: A | Discharge status: Home / Self Care | Payer: No Typology Code available for payment source | Source: Ambulatory Visit | Attending: Obstetrics | Admitting: Obstetrics

## 2014-12-31 ENCOUNTER — Encounter (HOSPITAL_COMMUNITY): Payer: Self-pay

## 2014-12-31 DIAGNOSIS — Z3A34 34 weeks gestation of pregnancy: Secondary | ICD-10-CM | POA: Insufficient documentation

## 2014-12-31 DIAGNOSIS — O163 Unspecified maternal hypertension, third trimester: Secondary | ICD-10-CM | POA: Insufficient documentation

## 2014-12-31 DIAGNOSIS — O09523 Supervision of elderly multigravida, third trimester: Secondary | ICD-10-CM

## 2015-01-04 ENCOUNTER — Ambulatory Visit (HOSPITAL_COMMUNITY)
Admission: RE | Admit: 2015-01-04 | Discharge: 2015-01-04 | Disposition: A | Discharge status: Home / Self Care | Payer: No Typology Code available for payment source | Source: Ambulatory Visit | Attending: Obstetrics | Admitting: Obstetrics

## 2015-01-04 ENCOUNTER — Encounter (HOSPITAL_COMMUNITY): Payer: Self-pay

## 2015-01-04 ENCOUNTER — Other Ambulatory Visit (HOSPITAL_COMMUNITY): Payer: Self-pay | Admitting: Obstetrics and Gynecology

## 2015-01-04 ENCOUNTER — Other Ambulatory Visit (HOSPITAL_COMMUNITY): Payer: Self-pay | Admitting: Obstetrics

## 2015-01-04 ENCOUNTER — Other Ambulatory Visit (HOSPITAL_COMMUNITY): Payer: No Typology Code available for payment source

## 2015-01-04 DIAGNOSIS — O24419 Gestational diabetes mellitus in pregnancy, unspecified control: Secondary | ICD-10-CM | POA: Insufficient documentation

## 2015-01-04 DIAGNOSIS — Z3A34 34 weeks gestation of pregnancy: Secondary | ICD-10-CM | POA: Insufficient documentation

## 2015-01-04 DIAGNOSIS — O09529 Supervision of elderly multigravida, unspecified trimester: Secondary | ICD-10-CM | POA: Insufficient documentation

## 2015-01-04 MED ORDER — GLYBURIDE 2.5 MG PO TABS: 2.5000 mg | ORAL_TABLET | Freq: Every day | ORAL | Status: DC

## 2015-01-04 MED ORDER — GLYBURIDE 2.5 MG PO TABS
2.5000 mg | ORAL_TABLET | Freq: Two times a day (BID) | ORAL | Status: DC
Start: 2015-01-04 — End: 2015-02-04

## 2015-01-04 NOTE — Consult Note (Signed)
Durwin NoraJeffrey M Mekel Haverstock, MD Physician Signed Maternal-Fetal Medicine Progress Notes 01/04/2015 11:12 AM    Expand All Collapse All   MFM Staff Consultation:  Concerning the diagnosis of gestational diabetes there is an increased risk of pregnancy complications including but not limited to: hypertensive disorders of pregnancy, worsening of glycemic control and renal function, preterm delivery, and need for cesarean section. Fetal and neonatal issues related to diabetes include but are not limited to: growth abnormalities neonatal hypoglycemia, hyperbilirubinemia, thrombocytopenia as well as macrosomia, polyhydramnios, and increased risk of fetal demise.   Review of her recent glycemic measures are indicative of suboptimal control with diet alone, leading to my recommendation to start glyburide 2.5mg  po bid. Given that the patient requires medical treatment of her gestational diabetes then fetal surveillance should be continued with interval serial growth ultrasounds and antepartum testing with twice weekly non-stress tests and weekly AFI.  I talked about the tightness of glycemic control that is recommended before and during pregnancy.I detailed the blood sugar targets of having fasting values in the 70-95 mg/dl range, and postprandial values under 120 mg/dl at two hours. I emphasized the time and effort required to achieve tight control in pregnancy, including the meticulous fingerstick monitoring; close adherence to diet, glyburide, and an exercise program; and, compliance with obstetric visits and scheduled fetal testing.   The patient appears to have a good understanding of the goals of glycemic control during pregnancy, with fasting blood sugars less than 90 mg/dl and 2-hour postprandial less than 120 mg/dl with avoidance of hypoglycemia. I suggest follow up of glycemic log in 2 weeks.   Impressions: Active SIUP at 5124w4d with reactive NST GDMA2 (GDM diet inadequate although patient is  compliant)  Recommendations: 1. Glycemic management: Initiate glyburide 2.5mg  po bid. If glycemic measures need additional medication, I would recommend further increase in glyburide as deemed appropriate by the pattern of high values. 2.continue interval growth monthly 3. Continue twice weekly NST and weekly AFI 4. Recommend delivery 39-40 weeks given need for oral hypoglycemic  Time Spent: I spent in excess of 30 minutes in consultation with this patient to review records, evaluate her case, and provide her with an adequate discussion and education. More than 50% of this time was spent in direct face-to-face counseling.  It was a pleasure seeing your patient in the office today. Thank you for consultation. Please do not hesitate to contact our service for any further questions or if desired we can also be of assistance for continued glycemic monitoring (please just call our office to arrange follow up if desired).  Thank you,  Louann SjogrenJeffrey Morgan Gaynelle Arabianenney  Kaylany Tesoriero, Louann SjogrenJeffrey Morgan, MD, MS, FACOG Section of Maternal-Fetal Medicine       Routing History     Date/Time From To Method   01/04/2015 11:22 AM Durwin NoraJeffrey M Seydina Holliman, MD Kathreen CosierBernard A Marshall, MD Fax

## 2015-01-04 NOTE — Progress Notes (Signed)
MFM Staff Consultation:  Concerning the diagnosis of gestational diabetes there is an increased risk of pregnancy complications including but not limited to: hypertensive disorders of pregnancy, worsening of glycemic control and renal function, preterm delivery, and need for cesarean section. Fetal and neonatal issues related to diabetes include but are not limited to: growth abnormalities neonatal hypoglycemia, hyperbilirubinemia, thrombocytopenia as well as macrosomia, polyhydramnios, and increased risk of fetal demise.   Review of her recent glycemic measures are indicative of suboptimal control with diet alone, leading to my recommendation to start glyburide 2.5mg  po bid.  Given that the patient requires medical treatment of her gestational diabetes then fetal surveillance should be continued with interval serial growth ultrasounds and antepartum testing with twice weekly non-stress tests and weekly AFI.  I talked about the tightness of glycemic control that is recommended before and during pregnancy.I detailed the blood sugar targets of having fasting values in the 70-95 mg/dl range, and postprandial values under 120 mg/dl at two hours. I emphasized the time and effort required to achieve tight control in pregnancy, including the meticulous fingerstick monitoring; close adherence to diet, glyburide, and an exercise program; and, compliance with obstetric visits and scheduled fetal testing.   The patient appears to have a good understanding of the goals of glycemic control during pregnancy, with fasting blood sugars less than 90 mg/dl and 2-hour postprandial less than 120 mg/dl with avoidance of hypoglycemia. I suggest follow up of glycemic log in 2 weeks.    Impressions: Active SIUP at 6867w4d with reactive NST GDMA2 (GDM diet inadequate although patient is compliant)  Recommendations: 1. Glycemic management: Initiate glyburide 2.5mg  po bid. If glycemic measures need additional medication, I would  recommend further increase in glyburide as deemed appropriate by the pattern of high values. 2.continue interval growth monthly 3. Continue twice weekly NST and weekly AFI 4. Recommend delivery 39-40 weeks given need for oral hypoglycemic  Time Spent: I spent in excess of 30 minutes in consultation with this patient to review records, evaluate her case, and provide her with an adequate discussion and education. More than 50% of this time was spent in direct face-to-face counseling.  It was a pleasure seeing your patient in the office today. Thank you for consultation. Please do not hesitate to contact our service for any further questions or if desired we can also be of assistance for continued glycemic monitoring (please just call our office to arrange follow up if desired).  Thank you,  Louann SjogrenJeffrey Morgan Gaynelle Arabianenney  Denney, Louann SjogrenJeffrey Morgan, MD, MS, FACOG Section of Maternal-Fetal Medicine

## 2015-01-07 ENCOUNTER — Ambulatory Visit (HOSPITAL_COMMUNITY): Payer: No Typology Code available for payment source

## 2015-01-07 ENCOUNTER — Ambulatory Visit (HOSPITAL_COMMUNITY)
Admission: RE | Admit: 2015-01-07 | Discharge: 2015-01-07 | Disposition: A | Discharge status: Home / Self Care | Payer: No Typology Code available for payment source | Source: Ambulatory Visit | Attending: Obstetrics | Admitting: Obstetrics

## 2015-01-07 ENCOUNTER — Other Ambulatory Visit (HOSPITAL_COMMUNITY): Payer: No Typology Code available for payment source

## 2015-01-07 ENCOUNTER — Encounter (HOSPITAL_COMMUNITY): Payer: Self-pay

## 2015-01-07 DIAGNOSIS — Z3A35 35 weeks gestation of pregnancy: Secondary | ICD-10-CM | POA: Insufficient documentation

## 2015-01-07 DIAGNOSIS — O09523 Supervision of elderly multigravida, third trimester: Secondary | ICD-10-CM

## 2015-01-07 DIAGNOSIS — O163 Unspecified maternal hypertension, third trimester: Secondary | ICD-10-CM

## 2015-01-07 DIAGNOSIS — O24419 Gestational diabetes mellitus in pregnancy, unspecified control: Secondary | ICD-10-CM | POA: Insufficient documentation

## 2015-01-10 ENCOUNTER — Ambulatory Visit: Payer: No Typology Code available for payment source

## 2015-01-11 ENCOUNTER — Encounter (HOSPITAL_COMMUNITY): Payer: Self-pay

## 2015-01-11 ENCOUNTER — Ambulatory Visit (HOSPITAL_COMMUNITY)
Admission: RE | Admit: 2015-01-11 | Discharge: 2015-01-11 | Disposition: A | Discharge status: Home / Self Care | Payer: No Typology Code available for payment source | Source: Ambulatory Visit | Attending: Obstetrics | Admitting: Obstetrics

## 2015-01-11 ENCOUNTER — Other Ambulatory Visit (HOSPITAL_COMMUNITY): Payer: No Typology Code available for payment source

## 2015-01-11 DIAGNOSIS — O09529 Supervision of elderly multigravida, unspecified trimester: Secondary | ICD-10-CM | POA: Insufficient documentation

## 2015-01-11 DIAGNOSIS — O10919 Unspecified pre-existing hypertension complicating pregnancy, unspecified trimester: Secondary | ICD-10-CM | POA: Insufficient documentation

## 2015-01-11 LAB — OB RESULTS CONSOLE GBS: GBS: NEGATIVE

## 2015-01-11 NOTE — ED Notes (Signed)
Translator present for entire visit.  CBG log brought and reviewed by Dr. Claudean SeveranceWhitecar and increased nightly Glyburide to 5.0 mg, keep am dose at 2.5 mg.

## 2015-01-12 ENCOUNTER — Encounter (HOSPITAL_COMMUNITY): Payer: Self-pay

## 2015-01-14 ENCOUNTER — Other Ambulatory Visit (HOSPITAL_COMMUNITY): Payer: No Typology Code available for payment source

## 2015-01-14 ENCOUNTER — Encounter (HOSPITAL_COMMUNITY): Payer: Self-pay

## 2015-01-14 ENCOUNTER — Ambulatory Visit (HOSPITAL_COMMUNITY)
Admission: RE | Admit: 2015-01-14 | Discharge: 2015-01-14 | Disposition: A | Discharge status: Home / Self Care | Payer: No Typology Code available for payment source | Source: Ambulatory Visit | Attending: Obstetrics | Admitting: Obstetrics

## 2015-01-14 ENCOUNTER — Ambulatory Visit (HOSPITAL_COMMUNITY): Payer: No Typology Code available for payment source

## 2015-01-14 DIAGNOSIS — O24410 Gestational diabetes mellitus in pregnancy, diet controlled: Secondary | ICD-10-CM | POA: Insufficient documentation

## 2015-01-14 DIAGNOSIS — O09523 Supervision of elderly multigravida, third trimester: Secondary | ICD-10-CM

## 2015-01-14 DIAGNOSIS — O163 Unspecified maternal hypertension, third trimester: Secondary | ICD-10-CM

## 2015-01-17 ENCOUNTER — Other Ambulatory Visit (HOSPITAL_COMMUNITY): Payer: Self-pay | Admitting: Obstetrics

## 2015-01-18 ENCOUNTER — Other Ambulatory Visit (HOSPITAL_COMMUNITY): Payer: No Typology Code available for payment source

## 2015-01-18 ENCOUNTER — Encounter (HOSPITAL_COMMUNITY): Payer: Self-pay

## 2015-01-18 ENCOUNTER — Ambulatory Visit (HOSPITAL_COMMUNITY)
Admission: RE | Admit: 2015-01-18 | Discharge: 2015-01-18 | Disposition: A | Discharge status: Home / Self Care | Payer: No Typology Code available for payment source | Source: Ambulatory Visit | Attending: Obstetrics | Admitting: Obstetrics

## 2015-01-18 DIAGNOSIS — O24419 Gestational diabetes mellitus in pregnancy, unspecified control: Secondary | ICD-10-CM | POA: Insufficient documentation

## 2015-01-18 NOTE — Progress Notes (Signed)
MFM note  Zachery DakinsFilomena Mejia-Perez was seen for NST.  She is a 39 yo [id number] currently at 36w 2d with gestational diabets and CHTN.  BP 124/79, 167#, pulse 71 Reactive NST Some irregular contractions  Finger stick review: All fasting and post prandial values in target range - no changes to Glyburide therapy. Continue antenatal testing as scheduled.  Alpha GulaPaul Skylan Lara, MD Maternal Fetal Medicine

## 2015-01-18 NOTE — ED Notes (Signed)
Blood sugar log reviewed by Dr. Claudean SeveranceWhitecar, copied and scanned to chart.

## 2015-01-21 ENCOUNTER — Other Ambulatory Visit (HOSPITAL_COMMUNITY): Payer: Self-pay | Admitting: Maternal and Fetal Medicine

## 2015-01-21 ENCOUNTER — Ambulatory Visit (HOSPITAL_COMMUNITY)
Admission: RE | Admit: 2015-01-21 | Discharge: 2015-01-21 | Disposition: A | Discharge status: Home / Self Care | Payer: No Typology Code available for payment source | Source: Ambulatory Visit | Attending: Obstetrics | Admitting: Obstetrics

## 2015-01-21 ENCOUNTER — Other Ambulatory Visit (HOSPITAL_COMMUNITY): Payer: Self-pay | Admitting: Obstetrics and Gynecology

## 2015-01-21 ENCOUNTER — Encounter (HOSPITAL_COMMUNITY): Payer: Self-pay

## 2015-01-21 ENCOUNTER — Ambulatory Visit (HOSPITAL_COMMUNITY): Payer: No Typology Code available for payment source

## 2015-01-21 ENCOUNTER — Other Ambulatory Visit (HOSPITAL_COMMUNITY): Payer: No Typology Code available for payment source

## 2015-01-21 DIAGNOSIS — O163 Unspecified maternal hypertension, third trimester: Secondary | ICD-10-CM

## 2015-01-21 DIAGNOSIS — O10913 Unspecified pre-existing hypertension complicating pregnancy, third trimester: Secondary | ICD-10-CM | POA: Insufficient documentation

## 2015-01-21 DIAGNOSIS — O09523 Supervision of elderly multigravida, third trimester: Secondary | ICD-10-CM

## 2015-01-21 DIAGNOSIS — Z3A37 37 weeks gestation of pregnancy: Secondary | ICD-10-CM | POA: Insufficient documentation

## 2015-01-25 ENCOUNTER — Ambulatory Visit (HOSPITAL_COMMUNITY)
Admission: RE | Admit: 2015-01-25 | Discharge: 2015-01-25 | Disposition: A | Discharge status: Home / Self Care | Payer: No Typology Code available for payment source | Source: Ambulatory Visit | Attending: Obstetrics | Admitting: Obstetrics

## 2015-01-25 ENCOUNTER — Other Ambulatory Visit (HOSPITAL_COMMUNITY): Payer: Self-pay | Admitting: Obstetrics and Gynecology

## 2015-01-25 DIAGNOSIS — O09523 Supervision of elderly multigravida, third trimester: Secondary | ICD-10-CM

## 2015-01-25 DIAGNOSIS — O163 Unspecified maternal hypertension, third trimester: Secondary | ICD-10-CM

## 2015-01-25 DIAGNOSIS — O24419 Gestational diabetes mellitus in pregnancy, unspecified control: Secondary | ICD-10-CM | POA: Insufficient documentation

## 2015-01-25 DIAGNOSIS — Z3A37 37 weeks gestation of pregnancy: Secondary | ICD-10-CM | POA: Insufficient documentation

## 2015-01-25 DIAGNOSIS — Z79899 Other long term (current) drug therapy: Secondary | ICD-10-CM | POA: Insufficient documentation

## 2015-01-25 DIAGNOSIS — O288 Other abnormal findings on antenatal screening of mother: Secondary | ICD-10-CM

## 2015-01-25 DIAGNOSIS — O10013 Pre-existing essential hypertension complicating pregnancy, third trimester: Secondary | ICD-10-CM | POA: Insufficient documentation

## 2015-01-28 ENCOUNTER — Ambulatory Visit (HOSPITAL_COMMUNITY)
Admission: RE | Admit: 2015-01-28 | Discharge: 2015-01-28 | Disposition: A | Discharge status: Home / Self Care | Payer: No Typology Code available for payment source | Source: Ambulatory Visit | Attending: Obstetrics | Admitting: Obstetrics

## 2015-01-28 ENCOUNTER — Ambulatory Visit (HOSPITAL_COMMUNITY): Admission: RE | Admit: 2015-01-28 | Payer: No Typology Code available for payment source | Source: Ambulatory Visit

## 2015-01-28 DIAGNOSIS — O09523 Supervision of elderly multigravida, third trimester: Secondary | ICD-10-CM | POA: Insufficient documentation

## 2015-01-28 DIAGNOSIS — O24419 Gestational diabetes mellitus in pregnancy, unspecified control: Secondary | ICD-10-CM | POA: Insufficient documentation

## 2015-01-28 DIAGNOSIS — O10013 Pre-existing essential hypertension complicating pregnancy, third trimester: Secondary | ICD-10-CM | POA: Insufficient documentation

## 2015-01-28 DIAGNOSIS — Z3A37 37 weeks gestation of pregnancy: Secondary | ICD-10-CM | POA: Insufficient documentation

## 2015-01-28 DIAGNOSIS — Z3A38 38 weeks gestation of pregnancy: Secondary | ICD-10-CM | POA: Insufficient documentation

## 2015-02-01 ENCOUNTER — Other Ambulatory Visit (HOSPITAL_COMMUNITY): Payer: Self-pay | Admitting: Obstetrics and Gynecology

## 2015-02-01 ENCOUNTER — Ambulatory Visit (HOSPITAL_COMMUNITY)
Admission: RE | Admit: 2015-02-01 | Discharge: 2015-02-01 | Disposition: A | Discharge status: Home / Self Care | Payer: No Typology Code available for payment source | Source: Ambulatory Visit | Attending: Obstetrics | Admitting: Obstetrics

## 2015-02-01 ENCOUNTER — Encounter (HOSPITAL_COMMUNITY): Payer: Self-pay

## 2015-02-01 DIAGNOSIS — O09523 Supervision of elderly multigravida, third trimester: Secondary | ICD-10-CM | POA: Insufficient documentation

## 2015-02-01 DIAGNOSIS — O163 Unspecified maternal hypertension, third trimester: Secondary | ICD-10-CM | POA: Insufficient documentation

## 2015-02-01 NOTE — ED Notes (Signed)
Language interpreter Nile RiggsMariel Gallego with pt.

## 2015-02-01 NOTE — ED Notes (Signed)
[facility] on [address], per Dr. Sherrie Georgeecker.

## 2015-02-02 ENCOUNTER — Telehealth (HOSPITAL_COMMUNITY): Payer: Self-pay | Admitting: *Deleted

## 2015-02-02 ENCOUNTER — Encounter (HOSPITAL_COMMUNITY): Payer: Self-pay | Admitting: *Deleted

## 2015-02-02 ENCOUNTER — Other Ambulatory Visit (HOSPITAL_COMMUNITY): Payer: Self-pay | Admitting: Obstetrics

## 2015-02-02 NOTE — Telephone Encounter (Signed)
Preadmission screen Interpreter number [id number] 

## 2015-02-02 NOTE — Telephone Encounter (Signed)
Preadmission screen Interpreter number [phone]

## 2015-02-04 ENCOUNTER — Encounter (HOSPITAL_COMMUNITY): Payer: Self-pay

## 2015-02-04 ENCOUNTER — Inpatient Hospital Stay (HOSPITAL_COMMUNITY)
Admission: RE | Admit: 2015-02-04 | Discharge: 2015-02-06 | DRG: 774 | Disposition: A | Discharge status: Home / Self Care | Payer: Medicaid Other | Source: Ambulatory Visit | Attending: Obstetrics | Admitting: Obstetrics

## 2015-02-04 DIAGNOSIS — Z79899 Other long term (current) drug therapy: Secondary | ICD-10-CM

## 2015-02-04 DIAGNOSIS — Z3A39 39 weeks gestation of pregnancy: Secondary | ICD-10-CM | POA: Diagnosis present

## 2015-02-04 DIAGNOSIS — O24429 Gestational diabetes mellitus in childbirth, unspecified control: Principal | ICD-10-CM | POA: Diagnosis present

## 2015-02-04 DIAGNOSIS — O24419 Gestational diabetes mellitus in pregnancy, unspecified control: Secondary | ICD-10-CM

## 2015-02-04 DIAGNOSIS — O1092 Unspecified pre-existing hypertension complicating childbirth: Secondary | ICD-10-CM | POA: Diagnosis present

## 2015-02-04 LAB — CBC
HEMATOCRIT: 34.5 % — AB (ref 36.0–46.0)
HEMOGLOBIN: 11.8 g/dL — AB (ref 12.0–15.0)
MCH: 30.3 pg (ref 26.0–34.0)
MCHC: 34.2 g/dL (ref 30.0–36.0)
MCV: 88.5 fL (ref 78.0–100.0)
PLATELETS: 187 10*3/uL (ref 150–400)
RBC: 3.9 MIL/uL (ref 3.87–5.11)
RDW: 14 % (ref 11.5–15.5)
WBC: 6.8 10*3/uL (ref 4.0–10.5)

## 2015-02-04 LAB — ABO/RH: ABO/RH(D): O POS

## 2015-02-04 LAB — TYPE AND SCREEN
ABO/RH(D): O POS
ANTIBODY SCREEN: NEGATIVE

## 2015-02-04 LAB — RPR: RPR Ser Ql: NONREACTIVE

## 2015-02-04 MED ORDER — OXYTOCIN 40 UNITS IN LACTATED RINGERS INFUSION - SIMPLE MED
1.0000 m[IU]/min | INTRAVENOUS | Status: DC
  Administered 2015-02-04: 2 m[IU]/min via INTRAVENOUS
  Filled 2015-02-04: qty 1000

## 2015-02-04 MED ORDER — ONDANSETRON HCL 4 MG/2ML IJ SOLN: 4.0000 mg | INTRAMUSCULAR | Status: DC | PRN

## 2015-02-04 MED ORDER — ACETAMINOPHEN 325 MG PO TABS: 650.0000 mg | ORAL_TABLET | ORAL | Status: DC | PRN

## 2015-02-04 MED ORDER — OXYTOCIN BOLUS FROM INFUSION: 500.0000 mL | INTRAVENOUS | Status: DC

## 2015-02-04 MED ORDER — OXYCODONE-ACETAMINOPHEN 5-325 MG PO TABS
1.0000 | ORAL_TABLET | ORAL | Status: DC | PRN
  Administered 2015-02-04: 1 via ORAL
  Filled 2015-02-04: qty 1

## 2015-02-04 MED ORDER — OXYCODONE-ACETAMINOPHEN 5-325 MG PO TABS: 2.0000 | ORAL_TABLET | ORAL | Status: DC | PRN

## 2015-02-04 MED ORDER — LABETALOL HCL 200 MG PO TABS
200.0000 mg | ORAL_TABLET | Freq: Two times a day (BID) | ORAL | Status: DC
  Administered 2015-02-04: 200 mg via ORAL
  Filled 2015-02-04 (×3): qty 1

## 2015-02-04 MED ORDER — FLEET ENEMA 7-19 GM/118ML RE ENEM: 1.0000 | ENEMA | RECTAL | Status: DC | PRN

## 2015-02-04 MED ORDER — ONDANSETRON HCL 4 MG PO TABS
4.0000 mg | ORAL_TABLET | ORAL | Status: DC | PRN
  Administered 2015-02-05: 4 mg via ORAL
  Filled 2015-02-04: qty 1

## 2015-02-04 MED ORDER — WITCH HAZEL-GLYCERIN EX PADS
1.0000 "application " | MEDICATED_PAD | CUTANEOUS | Status: DC | PRN
  Administered 2015-02-05: 1 via TOPICAL

## 2015-02-04 MED ORDER — IBUPROFEN 600 MG PO TABS
600.0000 mg | ORAL_TABLET | Freq: Four times a day (QID) | ORAL | Status: DC
  Administered 2015-02-04 – 2015-02-06 (×8): 600 mg via ORAL
  Filled 2015-02-04 (×8): qty 1

## 2015-02-04 MED ORDER — LANOLIN HYDROUS EX OINT: TOPICAL_OINTMENT | CUTANEOUS | Status: DC | PRN

## 2015-02-04 MED ORDER — OXYCODONE-ACETAMINOPHEN 5-325 MG PO TABS: 1.0000 | ORAL_TABLET | ORAL | Status: DC | PRN

## 2015-02-04 MED ORDER — SENNOSIDES-DOCUSATE SODIUM 8.6-50 MG PO TABS
2.0000 | ORAL_TABLET | ORAL | Status: DC
  Administered 2015-02-04: 2 via ORAL
  Filled 2015-02-04 (×2): qty 2

## 2015-02-04 MED ORDER — LACTATED RINGERS IV SOLN
INTRAVENOUS | Status: DC
  Administered 2015-02-04: 07:00:00 via INTRAVENOUS

## 2015-02-04 MED ORDER — BENZOCAINE-MENTHOL 20-0.5 % EX AERO
1.0000 "application " | INHALATION_SPRAY | CUTANEOUS | Status: DC | PRN
  Administered 2015-02-05: 1 via TOPICAL
  Filled 2015-02-04: qty 56

## 2015-02-04 MED ORDER — CITRIC ACID-SODIUM CITRATE 334-500 MG/5ML PO SOLN: 30.0000 mL | ORAL | Status: DC | PRN

## 2015-02-04 MED ORDER — TERBUTALINE SULFATE 1 MG/ML IJ SOLN
0.2500 mg | Freq: Once | INTRAMUSCULAR | Status: DC | PRN
  Filled 2015-02-04: qty 1

## 2015-02-04 MED ORDER — BUTORPHANOL TARTRATE 1 MG/ML IJ SOLN
1.0000 mg | INTRAMUSCULAR | Status: DC | PRN
Start: 2015-02-04 — End: 2015-02-04

## 2015-02-04 MED ORDER — FERROUS SULFATE 325 (65 FE) MG PO TABS
325.0000 mg | ORAL_TABLET | Freq: Two times a day (BID) | ORAL | Status: DC
  Administered 2015-02-04 – 2015-02-06 (×4): 325 mg via ORAL
  Filled 2015-02-04 (×4): qty 1

## 2015-02-04 MED ORDER — PRENATAL MULTIVITAMIN CH
1.0000 | ORAL_TABLET | Freq: Every day | ORAL | Status: DC
  Administered 2015-02-05 – 2015-02-06 (×2): 1 via ORAL
  Filled 2015-02-04 (×2): qty 1

## 2015-02-04 MED ORDER — LACTATED RINGERS IV SOLN: 500.0000 mL | INTRAVENOUS | Status: DC | PRN

## 2015-02-04 MED ORDER — ZOLPIDEM TARTRATE 5 MG PO TABS: 5.0000 mg | ORAL_TABLET | Freq: Every evening | ORAL | Status: DC | PRN

## 2015-02-04 MED ORDER — DIPHENHYDRAMINE HCL 25 MG PO CAPS: 25.0000 mg | ORAL_CAPSULE | Freq: Four times a day (QID) | ORAL | Status: DC | PRN

## 2015-02-04 MED ORDER — TETANUS-DIPHTH-ACELL PERTUSSIS 5-2.5-18.5 LF-MCG/0.5 IM SUSP: 0.5000 mL | Freq: Once | INTRAMUSCULAR | Status: DC

## 2015-02-04 MED ORDER — DIBUCAINE 1 % RE OINT
1.0000 "application " | TOPICAL_OINTMENT | RECTAL | Status: DC | PRN
  Administered 2015-02-05: 1 via RECTAL
  Filled 2015-02-04: qty 28

## 2015-02-04 MED ORDER — LIDOCAINE HCL (PF) 1 % IJ SOLN
30.0000 mL | INTRAMUSCULAR | Status: AC | PRN
  Administered 2015-02-04: 30 mL via SUBCUTANEOUS
  Filled 2015-02-04: qty 30

## 2015-02-04 MED ORDER — OXYTOCIN 40 UNITS IN LACTATED RINGERS INFUSION - SIMPLE MED: 62.5000 mL/h | INTRAVENOUS | Status: DC

## 2015-02-04 MED ORDER — ONDANSETRON HCL 4 MG/2ML IJ SOLN: 4.0000 mg | Freq: Four times a day (QID) | INTRAMUSCULAR | Status: DC | PRN

## 2015-02-04 MED ORDER — SIMETHICONE 80 MG PO CHEW
80.0000 mg | CHEWABLE_TABLET | ORAL | Status: DC | PRN
Start: 2015-02-04 — End: 2015-02-06
  Administered 2015-02-05: 80 mg via ORAL
  Filled 2015-02-04: qty 1

## 2015-02-04 MED ORDER — LABETALOL HCL 200 MG PO TABS
200.0000 mg | ORAL_TABLET | Freq: Two times a day (BID) | ORAL | Status: DC
  Administered 2015-02-04: 200 mg via ORAL
  Filled 2015-02-04 (×2): qty 1

## 2015-02-04 NOTE — H&P (Signed)
This is Dr. Francoise CeoBernard Tannie Koskela dictating the history and physical on  [name] Perry  she's a 39 year old gravida 5 para 2032 at 39 weeks negative GBS followed by MFM because of   gestational diabetes  And  chronic hypertension she is on glyburide 2.5 mg in the a.m. and 5 mg p.m. with good control and she is a chronic hypertensive is on labetalol 200 mg by mouth   twice a day and her blood pressures have been normal throughout the pregnancy she's had numerous ultrasounds and nonstress tests and she is now in for induction cervix is 4 cm 70% vertex   vertex -2 she was started on low-dose Pitocin Past surgical history negative Past medical history chronic hypertension 3 years and gestational diabetes Social history denies smoking drinking alcohol use Family history negative for diabetes hypertension   chronic diseases System review denies headache and dizziness nausea vomiting abdominal pain otherwise negative Physical exam well-developed female at 39 weeks HEENT negative Thyroid negative Lungs clear to P&A Heart regular rhythm no murmurs no gallops Breasts engorged Abdomen term Pelvic as described above Extremities negative

## 2015-02-05 LAB — CBC
HEMATOCRIT: 29.9 % — AB (ref 36.0–46.0)
HEMOGLOBIN: 10.1 g/dL — AB (ref 12.0–15.0)
MCH: 29.4 pg (ref 26.0–34.0)
MCHC: 33.8 g/dL (ref 30.0–36.0)
MCV: 87.2 fL (ref 78.0–100.0)
Platelets: 163 10*3/uL (ref 150–400)
RBC: 3.43 MIL/uL — AB (ref 3.87–5.11)
RDW: 13.8 % (ref 11.5–15.5)
WBC: 9.7 10*3/uL (ref 4.0–10.5)

## 2015-02-05 MED ORDER — LABETALOL HCL 200 MG PO TABS
200.0000 mg | ORAL_TABLET | Freq: Two times a day (BID) | ORAL | Status: DC
Start: 2015-02-05 — End: 2015-02-06
  Administered 2015-02-05 – 2015-02-06 (×2): 200 mg via ORAL
  Filled 2015-02-05 (×2): qty 1

## 2015-02-05 NOTE — Progress Notes (Signed)
Patient c/o nausea and feeling lightheaded, VS 156/96, 76, 97.7. Patient denies any other problems at this time. Dr Gaynell FaceMarshall called and orders received.

## 2015-02-05 NOTE — Progress Notes (Signed)
Notified Dr Gaynell FaceMarshall of patients blood pressure result, labetalol discontinued.

## 2015-02-05 NOTE — Progress Notes (Signed)
Patient ID: [name] DakinsFilomena Perry, female   DOB: [birth year], 39 y.o.   MRN: [id number] Postpartum day one Blood pressure 110/71 Respiration 18 pulse 64 Fundus firm lochia moderate legs negative doing well

## 2015-02-06 ENCOUNTER — Ambulatory Visit: Payer: Self-pay

## 2015-02-06 NOTE — Progress Notes (Signed)
Patient ID: [name] Perry, female   DOB: 1976-03-25, 39 y.o.   MRN: [id number] Postpartum day 2 Blood pressure 10780 70 respiration 18 pulse 67 No complaints Fundus firm Lochia moderate Legs negative Home today on labetalol 200 twice a day and Percocet for pain

## 2015-02-06 NOTE — Progress Notes (Signed)
Notified Dr Gaynell FaceMarshall of patients BP result, no new orders, continue labetalol as previously order.

## 2015-02-06 NOTE — Progress Notes (Signed)
Checked on patients needs.  °Spanish interpreter  °

## 2015-02-06 NOTE — Discharge Summary (Signed)
Obstetric Discharge Summary Reason for Admission: induction of labor Prenatal Procedures: none Intrapartum Procedures: spontaneous vaginal delivery Postpartum Procedures: none Complications-Operative and Postpartum: none HEMOGLOBIN  Date Value Ref Range Status  02/05/2015 10.1* 12.0 - 15.0 g/dL Final   HCT  Date Value Ref Range Status  02/05/2015 29.9* 36.0 - 46.0 % Final    Physical Exam:  General: alert Lochia: appropriate Uterine Fundus: firm Incision: healing well DVT Evaluation: No evidence of DVT seen on physical exam.  Discharge Diagnoses: Term Pregnancy-delivered  Discharge Information: Date: 02/06/2015 Activity: pelvic rest Diet: routine Medications: Percocet Condition: improved Instructions: refer to practice specific booklet Discharge to: home Follow-up Information    Follow up with Kathreen CosierMARSHALL,BERNARD A, MD.   Specialty:  Obstetrics and Gynecology   Contact information:   [address] [place] [address] [phone]       Newborn Data: Live born female  Birth Weight: 9 lb 13.7 oz (4471 g) APGAR: 9,   Home with mother.  MARSHALL,BERNARD A 02/06/2015, 6:20 AM

## 2015-02-06 NOTE — Lactation Note (Signed)
This note was copied from the chart of [name] Tyeesha Perry. Lactation Consultation Note  Patient Name: [name] Zachery DakinsFilomena Perry ZOXWR'UToday's Date: 02/06/2015 Reason for consult: Follow-up assessment;Breast/nipple pain;Difficult latch Mom called for assist with latch. Baby fussy so started BF on left breast, baby can latch on left breast but does not always sustain good depth, after few minutes changed to right breast but baby could not coordinate a suckling pattern. Tried several positions but this did not improve so tried #20 nipple shield. Baby still has difficulty coordinating her latch, pre-fill the nipple shield with some formula using curved tipped syringe and then baby was able to latch and sustain a suckling pattern. Reviewed again with Mom how to hand express to have EBM to use to stimulate baby's suckling pattern and use EBM to pre-fill the nipple shield. Mom denies discomfort with using the nipple shield. LC notes on oral exam that baby has short posterior frenulum and with crying or trying to latch her tongue becomes bowl shaped. This along with high palate are making it difficult for baby to coordinate her suck on the right breast which has a shorter nipple shaft. The nipple shield did appear to help with latch on the right breast, baby became sleepy after BF on/off for 15 minutes. While scheduling OP follow up baby became fussy and Mom attempted to latch on the left breast. Baby was struggling to latch, used #24 nipple shield and she latched well with good depth. Mom reports no discomfort on the left breast with the nipple shield. Mom has positional stripe/bruising on left nipple from previous latches. Breast milk visible in the nipple shield after feedings. OP f/u scheduled for Monday, 02/14/15 at 1:00. Engorgement care reviewed with Mom. Advised she may need to pre-pump to help with latch. Advised baby should be at the breast 8-12 times in 24 hours and with feeding ques. Care of nipple shield  discussed with Mom, hand out given. Advised to use #24 for left breast and try 24 on right breast as well since baby latched easier to 24 nipple shield than 20.  Be sure to see breast milk in the nipple shield with feedings. Keep baby nursing for 15-20 minutes both breasts each feeding. Post pump as needed to empty breasts well then apply ice packs for the next 24-48 hours. Mom can supplement using EBM if baby not satisfied at the breast, she has supplemental guidelines handout.  Mom has used bottle few times to supplement and finger feeding with curved tipped syringe. Encouraged to call for questions/concerns.   Maternal Data    Feeding Feeding Type: Breast Fed Length of feed: 15 min (off/on)  LATCH Score/Interventions Latch: Grasps breast easily, tongue down, lips flanged, rhythmical sucking. (left breast using #24 nipple shield) Intervention(s): Adjust position;Assist with latch;Breast massage  Audible Swallowing: Spontaneous and intermittent  Type of Nipple: Everted at rest and after stimulation  Comfort (Breast/Nipple): Filling, red/small blisters or bruises, mild/mod discomfort  Problem noted: Filling;Cracked, bleeding, blisters, bruises;Mild/Moderate discomfort Interventions  (Cracked/bleeding/bruising/blister): Expressed breast milk to nipple;Hand pump Interventions (Mild/moderate discomfort): Comfort gels  Hold (Positioning): Assistance needed to correctly position infant at breast and maintain latch.  LATCH Score: 8  Lactation Tools Discussed/Used Tools: Pump;Nipple Shields;Comfort gels Nipple shield size: 24;20 Breast pump type: Manual   Consult Status Consult Status: Complete Date: 02/06/15 Follow-up type: In-patient    [name] Perry, [name] Perry 02/06/2015, 4:50 PM

## 2015-02-06 NOTE — Discharge Instructions (Signed)
Discharge instructions ° °· You can wash your hair °· Shower °· Eat what you want °· Drink what you want °· See me in 6 weeks °· Your ankles are going to swell more in the next 2 weeks than when pregnant °· No sex for 6 weeks ° ° °Shonica Weier A, MD 02/06/2015 ° ° °

## 2015-02-06 NOTE — Lactation Note (Signed)
This note was copied from the chart of [name] Undra Perry. Lactation Consultation Note  Patient Name: [name] Zachery DakinsFilomena Perry RUEAV'WToday's Date: 02/06/2015 Reason for consult: Follow-up assessment;Difficult latch;Breast/nipple pain Baby very fussy at this visit but assisted Mom with latching on right breast. Using breast compression baby is able to latch but has some difficulty sustaining the latch. Falls asleep then becomes fussy. Assisted Mom with positioning on left breast for more depth. Baby BF for on/off for 10 minutes then fell asleep. Mom to call with next feeding for assist before d/c.   Maternal Data    Feeding Feeding Type: Breast Fed Nipple Type: Slow - flow Length of feed: 10 min  LATCH Score/Interventions Latch: Repeated attempts needed to sustain latch, nipple held in mouth throughout feeding, stimulation needed to elicit sucking reflex. (with latching on right breast, sustains latch on left breast) Intervention(s): Adjust position;Assist with latch;Breast massage;Breast compression  Audible Swallowing: None  Type of Nipple: Everted at rest and after stimulation (left everted, right nipple shaft shorter than left, dimpled in center)  Comfort (Breast/Nipple): Filling, red/small blisters or bruises, mild/mod discomfort  Problem noted: Filling;Cracked, bleeding, blisters, bruises;Mild/Moderate discomfort Interventions  (Cracked/bleeding/bruising/blister): Hand pump;Expressed breast milk to nipple Interventions (Mild/moderate discomfort): Comfort gels  Hold (Positioning): Assistance needed to correctly position infant at breast and maintain latch. Intervention(s): Breastfeeding basics reviewed;Support Pillows;Position options;Skin to skin  LATCH Score: 5  Lactation Tools Discussed/Used Tools: Pump;Comfort gels Breast pump type: Manual   Consult Status Consult Status: Follow-up Date: 02/06/15 Follow-up type: In-patient    Alfred LevinsGranger, Zach Tietje Ann 02/06/2015,  12:32 PM

## 2015-02-06 NOTE — Progress Notes (Signed)
Checked on patients needs.  °Spanish Interpreter  °

## 2015-02-06 NOTE — Progress Notes (Signed)
Assisted RN with interpretation of medical questions for shift change Spanish Interpreter

## 2015-02-06 NOTE — Progress Notes (Signed)
Assisted lactation with interpretation of breastfeeding instructions.  °Spanish Interpreter  °

## 2015-02-06 NOTE — Lactation Note (Signed)
This note was copied from the chart of [name] Kadyn Perry. Lactation Consultation Note  Patient Name: [name] Perry ZOXWR'UToday's Date: 02/06/2015 Reason for consult: Follow-up assessment Ana the in- house Spanish interpreter present for visit. Mom reports baby is latching to left breast but she has soreness. Positional stripe, bruising observed. Baby does not latch to right breast. Mom is experienced BF, BF her older children for 1 year plus but reports they had difficulty latching to right breast as well. The right nipple is dimpled in the center and has shorter shaft that left breast. Mom's milk is coming in, both breasts becoming firm. Demonstrated hand expression and using hand pump to Mom, breasts softened, Mom received approx 5 ml of EBM. Ice pack given for comfort. Baby just had 20 ml of formula from bottle so now asleep. LC left phone number for Mom to call with next feeding for assistance with latch. Stressed importance of emptying her breast now that her milk is coming in. Advised to BF with each feeding 8-12 times or more in 24 hours and post pump to empty breast using the EBM as supplement instead of formula. Care for sore nipples reviewed, comfort gels given with instructions. Engorgement care reviewed.   Maternal Data    Feeding Feeding Type: Formula Nipple Type: Slow - flow Length of feed: 10 min  LATCH Score/Interventions       Type of Nipple: Everted at rest and after stimulation  Comfort (Breast/Nipple): Filling, red/small blisters or bruises, mild/mod discomfort  Problem noted: Filling;Cracked, bleeding, blisters, bruises;Mild/Moderate discomfort (left nipple) Interventions  (Cracked/bleeding/bruising/blister): Expressed breast milk to nipple Interventions (Mild/moderate discomfort): Comfort gels        Lactation Tools Discussed/Used Tools: Pump;Comfort gels Breast pump type: Manual   Consult Status Consult Status: Follow-up Date:  02/06/15 Follow-up type: In-patient    Alfred LevinsGranger, Asahel Risden Ann 02/06/2015, 11:51 AM

## 2015-02-14 ENCOUNTER — Ambulatory Visit (HOSPITAL_COMMUNITY): Payer: No Typology Code available for payment source

## 2015-06-03 LAB — CYTOLOGY - PAP: Pap: NEGATIVE

## 2015-07-15 ENCOUNTER — Ambulatory Visit: Payer: Self-pay | Admitting: Internal Medicine

## 2015-07-21 ENCOUNTER — Encounter: Payer: Self-pay | Admitting: Internal Medicine

## 2015-07-21 ENCOUNTER — Ambulatory Visit (INDEPENDENT_AMBULATORY_CARE_PROVIDER_SITE_OTHER): Payer: Self-pay | Admitting: Internal Medicine

## 2015-07-21 VITALS — BP 142/92 | HR 82 | Ht 62.0 in | Wt 148.0 lb

## 2015-07-21 DIAGNOSIS — F410 Panic disorder [episodic paroxysmal anxiety] without agoraphobia: Secondary | ICD-10-CM

## 2015-07-21 DIAGNOSIS — I10 Essential (primary) hypertension: Secondary | ICD-10-CM

## 2015-07-21 MED ORDER — LABETALOL HCL 100 MG PO TABS: 100.0000 mg | ORAL_TABLET | Freq: Two times a day (BID) | ORAL | Status: DC

## 2015-07-21 NOTE — Progress Notes (Signed)
   Subjective:    Patient ID: [name] DakinsFilomena Perry, female    DOB: June 17, 1976, 39 y.o.   MRN: [id number]  HPI  Lost a female friend who lived in [place] 3 weeks ago. Pt. Gives the following symptoms that have occurred since finding out about the loss of her friend.  For a couple of weeks, poor appetite, nausea with eating, didn't want to get out of bed.  Those symptoms have improved. Now with episodes of the following that come on suddenly and generally goes away now after 5 minutes--previously lasted up to 20 minutes, but decreasing in time.  Feels very nervous, her feet feel cold, severe headaches, feels like vomiting and heart is racing. Also having palpitations with nursing her baby. Heaviness in head.   Nursing her 625 month old baby.  Hypertension:  Again, breast feeding 225 month old baby.  Was on Beta blocker, Metoprolol prior to pregnancy.  At some point, appears she was switched to HCTZ.  May have taken this through the pregnancy.  Not clear what patient understood she was to do for bp following her last OB follow up.  Not taking anything currently      Review of Systems     Objective:   Physical Exam HEENT:  PERRL, EOMI, discs sharp, NT over frontal and maxillary sinuses, TMs pearly gray, throat without injection, nasal mucosa normal Neck:  Supple, no adenopathy, no thyromegaly Chest:  CTA CV:  RRR without murmur or rub, radial pulses normal and equal, no LE edema Abd:  S, NT, No HSM or masses, +BS throughout         Assessment & Plan:  1. Anxiety, possibly dysthymia and what may be mild panic disorder.  Warm Hand off to [name] Perry for counseling.    2.  Hypertension:  Start Labetalol during breastfeeding.  Avoid diuretics she was using previously

## 2015-07-21 NOTE — Patient Instructions (Signed)
Habla oficina si tiene problemas con medicina, Labetalol

## 2015-07-25 ENCOUNTER — Other Ambulatory Visit: Payer: Self-pay | Admitting: Licensed Clinical Social Worker

## 2015-07-28 ENCOUNTER — Ambulatory Visit: Payer: Self-pay | Admitting: Licensed Clinical Social Worker

## 2015-07-28 DIAGNOSIS — Z1331 Encounter for screening for depression: Secondary | ICD-10-CM

## 2015-07-29 ENCOUNTER — Telehealth: Payer: Self-pay | Admitting: Internal Medicine

## 2015-07-29 DIAGNOSIS — I10 Essential (primary) hypertension: Secondary | ICD-10-CM

## 2015-07-29 MED ORDER — LABETALOL HCL 100 MG PO TABS: 100.0000 mg | ORAL_TABLET | Freq: Two times a day (BID) | ORAL | Status: DC

## 2015-07-29 NOTE — Telephone Encounter (Signed)
Medication name is labetalol 100 mg tablet

## 2015-07-29 NOTE — Telephone Encounter (Signed)
Will send to [address].  Will cost $6 monthly

## 2015-07-29 NOTE — Telephone Encounter (Signed)
07/29/15 1:25 pm. Patient called . Patient [facility] to [facility] [place] [address] [place] and was told that she needed to pay $40.34 for it. She could not afford to pay it so she left it there. Patient was paying $4.00 for this medication before. Please advise
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
## 2015-07-29 NOTE — Telephone Encounter (Signed)
07/29/15 3:06 pm. Patient informed.

## 2015-08-02 ENCOUNTER — Other Ambulatory Visit: Payer: Self-pay | Admitting: Licensed Clinical Social Worker

## 2015-08-03 ENCOUNTER — Other Ambulatory Visit (INDEPENDENT_AMBULATORY_CARE_PROVIDER_SITE_OTHER): Payer: Self-pay | Admitting: Licensed Clinical Social Worker

## 2015-08-03 DIAGNOSIS — F330 Major depressive disorder, recurrent, mild: Secondary | ICD-10-CM

## 2015-08-03 NOTE — Progress Notes (Signed)
   THERAPY PROGRESS NOTE  Session Time: 60min  Participation Level: Active  Behavioral Response: Casual and Well GroomedAlertEuthymic  Type of Therapy: Individual Therapy  Treatment Goals addressed: Coping  Interventions: Strength-based and Supportive  Summary: [name] DakinsFilomena Perry is a 39 y.o. female who presents with a positive mood and appropriate affect. [name] completed the PHQ-9 and GAD-7 measures in order to assess her current depressive and anxiety symptoms; she scored a 3 for the PHQ-9 and a 1 for the GAD-7, indicating very few symptoms. She shared that she has been feeling better since her last counseling appointment. She reported that even though she is feeling good, she wants to continue counseling in order to learn how to cope with her life stressors. [name] Perry developed a treatment plan with LCSW to include one main goal: Improve ability to cope with stress and depression. [name] Perry shared about her feelings regarding the death of her young niece; she reported that she is starting to accept the death with time passing.   Suicidal/Homicidal: Nowithout intent/plan  Therapist Response: LCSW engaged Lemon in PHQ-9 and GAD-7 assessments to assess current symptoms. LCSW and [name] Perry developed a treatment plan that will guide their work together. LCSW reflected on [name] Perry's ability to seek help for herself and to commit to improving her coping skills.  Plan: Return again in 1 weeks.  Diagnosis: Axis I: Major Depression, single episode    Axis II: No diagnosis    [name] Simmeratosha [place], LCSW 08/03/2015

## 2015-08-09 ENCOUNTER — Other Ambulatory Visit: Payer: Self-pay | Admitting: Licensed Clinical Social Worker

## 2015-08-12 ENCOUNTER — Ambulatory Visit (INDEPENDENT_AMBULATORY_CARE_PROVIDER_SITE_OTHER): Payer: Self-pay | Admitting: Licensed Clinical Social Worker

## 2015-08-12 DIAGNOSIS — F330 Major depressive disorder, recurrent, mild: Secondary | ICD-10-CM
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
## 2015-08-26 ENCOUNTER — Other Ambulatory Visit (INDEPENDENT_AMBULATORY_CARE_PROVIDER_SITE_OTHER): Payer: Self-pay | Admitting: Licensed Clinical Social Worker

## 2015-08-26 DIAGNOSIS — F330 Major depressive disorder, recurrent, mild: Secondary | ICD-10-CM
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
## 2015-09-05 ENCOUNTER — Telehealth: Payer: Self-pay | Admitting: Internal Medicine

## 2015-09-05 NOTE — Telephone Encounter (Signed)
Patient called.  Is out of Rx labetalol (NORMODYNE) 100 mg. Tab.  [facility]

## 2015-09-05 NOTE — Telephone Encounter (Signed)
[facility] no longer carry Labetalol 100 mg because of cost they informed patient to check with [facility] and [facility] to see which pharmacy would be the least expensive. Once she found the pharmacy they instructed her to take her bottle with the remaining refills and they could get the prescription transferred. Natosha called patient and gave her this information patient told to call back if she had any trouble with this

## 2015-09-07 ENCOUNTER — Other Ambulatory Visit: Payer: Self-pay | Admitting: Licensed Clinical Social Worker

## 2015-09-08 ENCOUNTER — Telehealth: Payer: Self-pay | Admitting: Internal Medicine

## 2015-09-08 ENCOUNTER — Other Ambulatory Visit: Payer: Self-pay | Admitting: Licensed Clinical Social Worker

## 2015-09-08 NOTE — Telephone Encounter (Signed)
Spoke to patient about the other three pharmacies she might want to call an see what the prices are for the medication and decide where she wants to go so she can get the refill and start taking  Medication. [facility]

## 2015-09-08 NOTE — Telephone Encounter (Signed)
Patient called to set up appointment for the  2 week Bp follow up after starting new medication that she rescheduled last month. Patient scheduled to come in on 09/12/15 @ 4:00 pm. Patient is also out ( 3 days now) of her labetalol 100 mg tablet. [facility] a [facility] and was told that they don't have that medication there and was sent to [facility].  Patient's son called the pharmacy to see what the price was and it was $17.00 and patient cannot afford it. Would like to know where she can go get the medication at a lower price

## 2015-09-08 NOTE — Telephone Encounter (Signed)
Back to [name] Perry  to have [facility]

## 2015-09-12 ENCOUNTER — Other Ambulatory Visit (INDEPENDENT_AMBULATORY_CARE_PROVIDER_SITE_OTHER): Payer: Self-pay | Admitting: Licensed Clinical Social Worker

## 2015-09-12 DIAGNOSIS — F330 Major depressive disorder, recurrent, mild: Secondary | ICD-10-CM

## 2015-09-12 NOTE — Progress Notes (Signed)
   THERAPY PROGRESS NOTE  Session Time: 90minutes  Participation Level: Active  Behavioral Response: Casual and Well GroomedAlertEuthymic  Type of Therapy: Individual Therapy  Treatment Goals addressed: Coping  Interventions: Supportive and Other: Case management  Summary: Zachery DakinsFilomena Mejia-Perez is a 39 y.o. female who presents with a positive mood and appropriate affect. [name] shared that things have been good in her life overall. She reported that her only current stressor is that she has not been able to fill her hypertension medication since [facility] providing it. She reported that she had called and found a pharmacy that had the medication at a low cost but that she had no transportation. LCSW and [name] went together to the pharmacy and were able to get the prescription filled. [name] shared about her holiday plans. Caryl expressed her frustrations around trying to raise her children in American culture while also adhering to [place] values. She shared that the most important value to instill in her children was respect, especially in regards to respecting elders.  Suicidal/Homicidal: Nowithout intent/plan  Therapist Response: LCSW utilized supportive counseling techniques with OmnicomFilomena. LCSW offered to take [name] to the pharmacy in order to fill her prescription, as she had not taken her hypertension medication for over a week. LCSW assisted in interpreting for Courtnei at the pharmacy. LCSW explained the process of transferring between pharmacies. LCSW and [name] processed about her current stressors. LCSW reflected on Onesty's strength and perseverance in raising her children in the values that are important to her culturally.  Plan: Return again in 2 weeks.  Diagnosis: Axis I: Major Depression, single episode    Axis II: No diagnosis    Nilda Simmeratosha Radha Coggins, LCSW 09/12/2015

## 2015-09-27 ENCOUNTER — Encounter: Payer: Self-pay | Admitting: Internal Medicine

## 2015-09-27 DIAGNOSIS — O10019 Pre-existing essential hypertension complicating pregnancy, unspecified trimester: Secondary | ICD-10-CM | POA: Insufficient documentation

## 2015-10-03 ENCOUNTER — Other Ambulatory Visit: Payer: Self-pay | Admitting: Licensed Clinical Social Worker

## 2015-10-07 ENCOUNTER — Other Ambulatory Visit: Payer: Self-pay | Admitting: Licensed Clinical Social Worker

## 2015-10-12 ENCOUNTER — Other Ambulatory Visit: Payer: Self-pay | Admitting: Licensed Clinical Social Worker

## 2015-10-14 ENCOUNTER — Other Ambulatory Visit: Payer: Self-pay | Admitting: Licensed Clinical Social Worker

## 2015-11-14 ENCOUNTER — Telehealth: Payer: Self-pay | Admitting: Licensed Clinical Social Worker

## 2015-11-14 NOTE — Telephone Encounter (Signed)
LCSW left voicemail with pt regarding scheduling a counseling appointment. This was the third attempt at contact.

## 2015-11-23 DIAGNOSIS — O009 Unspecified ectopic pregnancy without intrauterine pregnancy: Secondary | ICD-10-CM

## 2015-11-23 HISTORY — DX: Unspecified ectopic pregnancy without intrauterine pregnancy: O00.90

## 2015-11-25 ENCOUNTER — Ambulatory Visit: Payer: Self-pay | Admitting: Internal Medicine

## 2015-12-29 ENCOUNTER — Ambulatory Visit: Payer: Self-pay | Admitting: Internal Medicine

## 2016-01-04 ENCOUNTER — Other Ambulatory Visit (INDEPENDENT_AMBULATORY_CARE_PROVIDER_SITE_OTHER): Payer: Self-pay | Admitting: Licensed Clinical Social Worker

## 2016-01-04 DIAGNOSIS — F418 Other specified anxiety disorders: Principal | ICD-10-CM

## 2016-01-04 DIAGNOSIS — F3341 Major depressive disorder, recurrent, in partial remission: Secondary | ICD-10-CM

## 2016-01-05 NOTE — Progress Notes (Signed)
   THERAPY PROGRESS NOTE  Session Time: 30min  Participation Level: Active  Behavioral Response: CasualAlertEuthymic  Type of Therapy: Individual Therapy  Treatment Goals addressed: Coping  Interventions: Supportive  Summary: [name] Perry is a 40 y.o. female who presents with a euthymic mood and appropriate affect. [name] Perry shared that things have been going well for her and she is experiencing fewer depressive and anxious symptoms than several months ago. She reported that her stress level is low but that she would like a few more sessions of counseling to maintain the progress that she has made. She shared about the current family dynamics. She reported that she would like to find a part time job but that her husband is pressuring her to stay home with the youngest child. She shared that it would feel good to get out of the house more regularly and spend time with other adults. She reported that she is walking some for exercise but would like to be doing more.   Suicidal/Homicidal: Nowithout intent/plan  Therapist Response: LCSW utilized supportive counseling techniques throughout the session in order to validate emotions and encourage open expression of emotion. LCSW and [name] Perry processed about her family relationships and current stressors.  Plan: Return again in 4 weeks.  Diagnosis: Axis I: See current hospital problem list    Axis II: No diagnosis    [name] Simmeratosha Marbeth Smedley, LCSW 01/05/2016

## 2016-02-07 ENCOUNTER — Ambulatory Visit (INDEPENDENT_AMBULATORY_CARE_PROVIDER_SITE_OTHER): Payer: Self-pay | Admitting: Internal Medicine

## 2016-02-07 ENCOUNTER — Encounter: Payer: Self-pay | Admitting: Internal Medicine

## 2016-02-07 ENCOUNTER — Other Ambulatory Visit (INDEPENDENT_AMBULATORY_CARE_PROVIDER_SITE_OTHER): Payer: Self-pay | Admitting: Licensed Clinical Social Worker

## 2016-02-07 VITALS — BP 109/60 | HR 60 | Resp 15 | Ht 62.25 in | Wt 157.0 lb

## 2016-02-07 DIAGNOSIS — F3341 Major depressive disorder, recurrent, in partial remission: Secondary | ICD-10-CM

## 2016-02-07 DIAGNOSIS — F418 Other specified anxiety disorders: Principal | ICD-10-CM

## 2016-02-07 DIAGNOSIS — I10 Essential (primary) hypertension: Secondary | ICD-10-CM

## 2016-02-07 DIAGNOSIS — B349 Viral infection, unspecified: Secondary | ICD-10-CM

## 2016-02-07 MED ORDER — LABETALOL HCL 100 MG PO TABS: ORAL_TABLET | ORAL | Status: DC
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
## 2016-02-07 NOTE — Addendum Note (Signed)
Addended by: Marcene DuosMULBERRY, Veneta Sliter M on: 02/07/2016 04:34 PM   Modules accepted: Orders, SmartSet

## 2016-02-07 NOTE — Progress Notes (Signed)
   Subjective:    Patient ID: [name] Perry, female    DOB: 12-Mar-1976, 40 y.o.   MRN: [id number]  HPI   1.  Recent Illness:  Felt she ran a fever about a week ago for 3 days. Also with headache and bodyaches.  No sore throat, cough or sneeze.  Feeling essentially at baseline now.  2.  Essential Hypertension:  Has been taking Labetalol 100 mg twice daily, though yesterday, only took in afternoon as was running out.    3.  Depression:  Getting counseling still with Samul DadaN. Knight, LCSW.  She feels she is doing better with this intervention.  She does not feel she is need of medication for this.      Current outpatient prescriptions:  .  Calcium 200 MG TABS, Take 2 tablets by mouth daily., Disp: , Rfl:  .  Garlic 100 MG TABS, Take 1 tablet by mouth 2 (two) times daily., Disp: , Rfl:  .  labetalol (NORMODYNE) 100 MG tablet, Take 1 tablet (100 mg total) by mouth 2 (two) times daily., Disp: 60 tablet, Rfl: 11 .  omega-3 acid ethyl esters (LOVAZA) 1 G capsule, Take 1 g by mouth 2 (two) times daily., Disp: , Rfl:    No Known Allergies  Review of Systems     Objective:   Physical Exam NAD HEENT:  PERRL, EOMI, throat without injection, TMs pearly gray Neck:  Supple, no adenopathy Chest:  CTA CV:  RRR with normal S1 and S2, No S3, S4 or murmur.  Radial pulses normal and equal LE:  No edema       Assessment & Plan:  1.  Viral syndrome:  Essentially resolved  2.  Essential Hypertension:  Controlled--wean to Labetalol 50 mg twice daily and return for bp check with BMP in 2 weeks --nurse visit. 4 months with me

## 2016-02-23 ENCOUNTER — Other Ambulatory Visit: Payer: Self-pay

## 2016-02-27 ENCOUNTER — Other Ambulatory Visit (INDEPENDENT_AMBULATORY_CARE_PROVIDER_SITE_OTHER): Payer: Self-pay

## 2016-02-27 VITALS — BP 112/70 | HR 62

## 2016-02-27 DIAGNOSIS — I10 Essential (primary) hypertension: Secondary | ICD-10-CM

## 2016-02-28 LAB — BASIC METABOLIC PANEL
BUN / CREAT RATIO: 25 — AB (ref 9–23)
BUN: 17 mg/dL (ref 6–20)
CHLORIDE: 102 mmol/L (ref 96–106)
CO2: 21 mmol/L (ref 18–29)
Calcium: 8.9 mg/dL (ref 8.7–10.2)
Creatinine, Ser: 0.67 mg/dL (ref 0.57–1.00)
GFR calc non Af Amer: 112 mL/min/{1.73_m2} (ref >59)
GFR, EST AFRICAN AMERICAN: 129 mL/min/{1.73_m2} (ref >59)
Glucose: 99 mg/dL (ref 65–99)
Potassium: 3.8 mmol/L (ref 3.5–5.2)
Sodium: 142 mmol/L (ref 134–144)

## 2016-03-08 ENCOUNTER — Other Ambulatory Visit: Payer: Self-pay | Admitting: Licensed Clinical Social Worker

## 2016-03-15 ENCOUNTER — Other Ambulatory Visit: Payer: PRIVATE HEALTH INSURANCE | Admitting: Licensed Clinical Social Worker

## 2016-03-19 ENCOUNTER — Other Ambulatory Visit (INDEPENDENT_AMBULATORY_CARE_PROVIDER_SITE_OTHER): Payer: Self-pay | Admitting: Licensed Clinical Social Worker

## 2016-03-19 DIAGNOSIS — F418 Other specified anxiety disorders: Principal | ICD-10-CM

## 2016-03-19 DIAGNOSIS — F3341 Major depressive disorder, recurrent, in partial remission: Secondary | ICD-10-CM

## 2016-03-20 NOTE — Progress Notes (Signed)
   THERAPY PROGRESS NOTE  Session Time: 45min  Participation Level: Active  Behavioral Response: CasualAlertEuthymic  Type of Therapy: Individual Therapy  Treatment Goals addressed: Coping  Interventions: Supportive  Summary: [name] Perry is a 40 y.o. female who presents with a positive mood and appropriate affect. She reported that she has been feeling good lately and that her stress level is low. She shared about having her older children at home with her over the summer vacation. [name] Perry reported some stress regarding an increased number of cockroaches in the home. She and LCSW discussed some options for removing the pests without using excessive chemicals. [name] Perry expressed concerns regarding neighbors that may be selling drugs or involved in other illicit activities. She shared about the precautions she takes with her own children so that they will be safe. She reported that her blood pressure was not as high at her last doctor's appointment, which was a relief.    Suicidal/Homicidal: Nowithout intent/plan  Therapist Response: LCSW utilized supportive counseling techniques throughout the session in order to validate emotions and encourage open expression of emotion. LCSW checked in regarding medication compliance and health issues. LCSW and [name] Perry processed about her current stressors.  Plan: Return again in 4 weeks.  Diagnosis: Axis I: See current hospital problem list    Axis II: No diagnosis    [name] Simmeratosha Abbygael Curtiss, LCSW 03/20/2016

## 2016-04-06 ENCOUNTER — Ambulatory Visit: Payer: PRIVATE HEALTH INSURANCE | Admitting: Internal Medicine

## 2016-04-23 ENCOUNTER — Other Ambulatory Visit (INDEPENDENT_AMBULATORY_CARE_PROVIDER_SITE_OTHER): Payer: Self-pay | Admitting: Licensed Clinical Social Worker

## 2016-04-23 DIAGNOSIS — F3341 Major depressive disorder, recurrent, in partial remission: Secondary | ICD-10-CM

## 2016-04-23 DIAGNOSIS — F418 Other specified anxiety disorders: Principal | ICD-10-CM

## 2016-04-24 NOTE — Progress Notes (Signed)
   THERAPY PROGRESS NOTE  Session Time:  Participation Level: Active  Behavioral Response: CasualAlertEuthymic  Type of Therapy: Individual Therapy  Treatment Goals addressed: Coping  Interventions: Supportive  Summary: [name] Perry is a 40 y.o. female who presents with a euthymic mood and appropriate affect. She reported that she has been doing well lately. She shared that she is occasionally stressed by being at home all day with her three children. She reported that her marriage relationship is good. [name] Perry and LCSW reviewed information regarding green cleaning and pest control in the home; she reported that she is still trying to get rid of roaches with little success. She stated that she would try the green cleaning recipes to see if they help. [name] expressed concerns that she has had swelling and pain in her abdomen for several weeks; she requested LCSW assistance in making an appointment at the clinic. She stated that she fears that she will be pregnant. She shared that her husband wants her to have another baby but she does not want another one. She reported that she has not bought a home pregnancy test because she fears knowing the answer.  Suicidal/Homicidal: Nowithout intent/plan  Therapist Response: LCSW utilized supportive counseling techniques throughout the session in order to validate emotions and encourage open expression of emotion. LCSW provided information regarding green cleaning recipes. LCSW encouraged [name] Perry to buy a home pregnancy test since her clinic visit is scheduled for a month out.  Plan: Return again in 4 weeks.  Diagnosis: Axis I: See current hospital problem list    Axis II: No diagnosis    Nilda Simmer, LCSW 04/24/2016

## 2016-05-21 ENCOUNTER — Other Ambulatory Visit: Payer: Self-pay | Admitting: Licensed Clinical Social Worker

## 2016-05-24 ENCOUNTER — Other Ambulatory Visit: Payer: Self-pay | Admitting: Licensed Clinical Social Worker

## 2016-05-31 ENCOUNTER — Ambulatory Visit: Payer: Self-pay | Admitting: Internal Medicine

## 2016-06-07 ENCOUNTER — Ambulatory Visit: Payer: Self-pay | Admitting: Internal Medicine

## 2016-07-04 ENCOUNTER — Other Ambulatory Visit: Payer: Self-pay | Admitting: Licensed Clinical Social Worker

## 2016-07-09 ENCOUNTER — Other Ambulatory Visit: Payer: Self-pay | Admitting: Licensed Clinical Social Worker

## 2016-07-09 ENCOUNTER — Other Ambulatory Visit (HOSPITAL_COMMUNITY): Payer: Self-pay | Admitting: Nurse Practitioner

## 2016-07-09 DIAGNOSIS — O09512 Supervision of elderly primigravida, second trimester: Secondary | ICD-10-CM

## 2016-07-09 DIAGNOSIS — Z3689 Encounter for other specified antenatal screening: Secondary | ICD-10-CM

## 2016-07-09 DIAGNOSIS — Z3A22 22 weeks gestation of pregnancy: Secondary | ICD-10-CM

## 2016-07-09 LAB — OB RESULTS CONSOLE VARICELLA ZOSTER ANTIBODY, IGG: VARICELLA IGG: IMMUNE

## 2016-07-09 LAB — OB RESULTS CONSOLE HGB/HCT, BLOOD
HCT: 36 %
Hemoglobin: 11.5 g/dL

## 2016-07-09 LAB — OB RESULTS CONSOLE ABO/RH: RH TYPE: POSITIVE

## 2016-07-09 LAB — OB RESULTS CONSOLE RPR: RPR: NONREACTIVE

## 2016-07-09 LAB — OB RESULTS CONSOLE ANTIBODY SCREEN: ANTIBODY SCREEN: NEGATIVE

## 2016-07-09 LAB — OB RESULTS CONSOLE HIV ANTIBODY (ROUTINE TESTING): HIV: NONREACTIVE

## 2016-07-09 LAB — OB RESULTS CONSOLE RUBELLA ANTIBODY, IGM: RUBELLA: IMMUNE

## 2016-07-09 LAB — OB RESULTS CONSOLE GC/CHLAMYDIA
CHLAMYDIA, DNA PROBE: NEGATIVE
Gonorrhea: NEGATIVE

## 2016-07-09 LAB — OB RESULTS CONSOLE HEPATITIS B SURFACE ANTIGEN: Hepatitis B Surface Ag: NEGATIVE

## 2016-07-09 LAB — OB RESULTS CONSOLE PLATELET COUNT: PLATELETS: 243 10*3/uL

## 2016-07-09 LAB — GLUCOSE TOLERANCE, 1 HOUR: Glucose, 1 Hour GTT: 95

## 2016-07-12 ENCOUNTER — Ambulatory Visit: Payer: Self-pay | Admitting: Internal Medicine

## 2016-07-13 ENCOUNTER — Encounter (HOSPITAL_COMMUNITY): Payer: Self-pay | Admitting: *Deleted

## 2016-07-16 ENCOUNTER — Encounter (HOSPITAL_COMMUNITY): Payer: Self-pay

## 2016-07-16 ENCOUNTER — Other Ambulatory Visit (HOSPITAL_COMMUNITY): Payer: Self-pay

## 2016-07-16 ENCOUNTER — Other Ambulatory Visit (HOSPITAL_COMMUNITY): Payer: Self-pay | Admitting: Nurse Practitioner

## 2016-07-16 ENCOUNTER — Ambulatory Visit (HOSPITAL_COMMUNITY)
Admission: RE | Admit: 2016-07-16 | Discharge: 2016-07-16 | Disposition: A | Discharge status: Home / Self Care | Payer: No Typology Code available for payment source | Source: Ambulatory Visit | Attending: Nurse Practitioner | Admitting: Nurse Practitioner

## 2016-07-16 ENCOUNTER — Other Ambulatory Visit (HOSPITAL_COMMUNITY): Payer: Self-pay | Admitting: *Deleted

## 2016-07-16 DIAGNOSIS — O09512 Supervision of elderly primigravida, second trimester: Secondary | ICD-10-CM

## 2016-07-16 DIAGNOSIS — Z3A20 20 weeks gestation of pregnancy: Secondary | ICD-10-CM | POA: Insufficient documentation

## 2016-07-16 DIAGNOSIS — Z3A22 22 weeks gestation of pregnancy: Secondary | ICD-10-CM

## 2016-07-16 DIAGNOSIS — Z363 Encounter for antenatal screening for malformations: Secondary | ICD-10-CM

## 2016-07-16 DIAGNOSIS — O289 Unspecified abnormal findings on antenatal screening of mother: Secondary | ICD-10-CM

## 2016-07-16 DIAGNOSIS — O169 Unspecified maternal hypertension, unspecified trimester: Secondary | ICD-10-CM

## 2016-07-16 DIAGNOSIS — O09522 Supervision of elderly multigravida, second trimester: Secondary | ICD-10-CM

## 2016-07-16 DIAGNOSIS — O283 Abnormal ultrasonic finding on antenatal screening of mother: Secondary | ICD-10-CM | POA: Insufficient documentation

## 2016-07-16 DIAGNOSIS — O10919 Unspecified pre-existing hypertension complicating pregnancy, unspecified trimester: Secondary | ICD-10-CM

## 2016-07-16 DIAGNOSIS — O09292 Supervision of pregnancy with other poor reproductive or obstetric history, second trimester: Secondary | ICD-10-CM | POA: Insufficient documentation

## 2016-07-16 DIAGNOSIS — Z3689 Encounter for other specified antenatal screening: Secondary | ICD-10-CM

## 2016-07-16 DIAGNOSIS — O09529 Supervision of elderly multigravida, unspecified trimester: Secondary | ICD-10-CM | POA: Insufficient documentation

## 2016-07-16 DIAGNOSIS — Z315 Encounter for genetic counseling: Secondary | ICD-10-CM | POA: Insufficient documentation

## 2016-07-16 DIAGNOSIS — O10012 Pre-existing essential hypertension complicating pregnancy, second trimester: Secondary | ICD-10-CM | POA: Insufficient documentation

## 2016-07-16 NOTE — Progress Notes (Signed)
Genetic Counseling  High-Risk Gestation Note  Appointment Date:  07/16/2016 Referred By: [name] Drone, NP Date of Birth:  1975-10-27   Pregnancy History: [id number] Estimated Date of Delivery: 12/02/16  Estimated Gestational Age: [redacted]w[redacted]d Attending: Charlsie Merles, MD   [name] was seen for genetic counseling because of a maternal age of 40 y.o..   [employer], [place], provided interpretation for today's visit.   In summary:  Discussed AMA and associated risk for fetal aneuploidy  Reviewed results of Quad screen-being recalculated given change of EDC based on today's ultrasound  DSR reduced to 1 in 228; recalculated result currently pending  T18 risk 1 in 42; recalculated result currently pending  Discussed options for screening  Ultrasound-performed today, within normal limits  NIPS- patient declined  Discussed diagnostic testing options  Amniocentesis-declined  Reviewed family history concerns  She was counseled regarding maternal age and the association with risk for chromosome conditions due to nondisjunction with aging of the ova.   We reviewed chromosomes, nondisjunction, and the associated 1 in 21 risk for fetal aneuploidy related to a maternal age of 40 y.o. at [redacted]w[redacted]d gestation.  She was counseled that the risk for aneuploidy decreases as gestational age increases, accounting for those pregnancies which spontaneously abort.  We specifically discussed Down syndrome (trisomy 57), trisomies 9 and 50, and sex chromosome aneuploidies (47,XXX and 47,XXY) including the common features and prognoses of each.   Ms. [name] Perry previously had Quad screen through [facility]. She was counseled that screening tests are used to modify a patient's a priori risk for aneuploidy, typically based on age. This estimate provides a pregnancy specific risk assessment. Quad screening reduced the risk for fetal Down syndrome  from 1 in 142 to 1 in 226. The risk for fetal trisomy 18 was increased from 1 in 428 to 1 in 95. However, the patient was unsure of the date of her last menstrual period, and ultrasound today changed the estimated date of delivery to 12/02/16. Request for recalculation was made to [facility], and recalculated values are currently pending. We reviewed the sensitivity of Quad screen and false positive rates. The patient understands that Quad screen is not diagnostic for these conditions and does not assess for all chromosome conditions.   We reviewed additional available screening options including noninvasive prenatal screening (NIPS)/cell free DNA (cfDNA) screening and detailed ultrasound.  She was counseled that screening tests are used to modify a patient's a priori risk for aneuploidy, typically based on age. This estimate provides a pregnancy specific risk assessment. We reviewed the benefits and limitations of each option. Specifically, we discussed the conditions for which each test screens, the detection rates, and false positive rates of each. She was also counseled regarding diagnostic testing via amniocentesis. We reviewed the approximate 1 in 300-500 risk for complications from amniocentesis, including spontaneous pregnancy loss. We discussed the possible results that the tests might provide including: positive, negative, unanticipated, and no result. Finally, they were counseled regarding the cost of each option and potential out of pocket expenses. After consideration of all the options, she declined NIPS and amniocentesis.   A detailed ultrasound was performed today. The ultrasound report will be sent under separate cover. There were no visualized fetal anomalies or markers suggestive of aneuploidy. Diagnostic testing was declined today.  She understands that screening tests cannot rule out all birth defects or genetic syndromes. The patient was advised of this limitation and  states she still does  not want additional testing at this time given that she is comfortable with the current risk assessment and that she would not alter her pregnancy course regarding a chromosome condition. She planned to further discuss additional screening and testing options with her husband though.   Both family histories were reviewed and found to be noncontributory for birth defects, intellectual disability, and known genetic conditions. Without further information regarding the provided family history, an accurate genetic risk cannot be calculated. Further genetic counseling is warranted if more information is obtained.  Ms. [name] Perry denied exposure to environmental toxins or chemical agents. She denied the use of alcohol, tobacco or street drugs. She denied significant viral illnesses during the course of her pregnancy. Her medical and surgical histories were contributory for hypertension, for which she is currently treated with labetalol.   I counseled Ms. [name] Perry regarding the above risks and available options.  The approximate face-to-face time with the genetic counselor was 55 minutes.  Quinn PlowmanKaren Harley Fitzwater, MS,  Certified Genetic Counselor 07/16/2016

## 2016-07-19 ENCOUNTER — Other Ambulatory Visit (INDEPENDENT_AMBULATORY_CARE_PROVIDER_SITE_OTHER): Payer: Self-pay | Admitting: Licensed Clinical Social Worker

## 2016-07-19 DIAGNOSIS — F330 Major depressive disorder, recurrent, mild: Secondary | ICD-10-CM
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
## 2016-07-20 ENCOUNTER — Telehealth (HOSPITAL_COMMUNITY): Payer: Self-pay | Admitting: MS"

## 2016-07-20 ENCOUNTER — Other Ambulatory Visit (HOSPITAL_COMMUNITY): Payer: Self-pay

## 2016-07-20 ENCOUNTER — Encounter: Payer: Self-pay | Admitting: *Deleted

## 2016-07-20 NOTE — Telephone Encounter (Signed)
Called Ms. Zachery DakinsFilomena Mejia-Perez via [employer] #[id number], Arlice ColtAnthony, Spanish/English telephonic interpreter regarding recalculated Quad screen result. Reviewed that [facility] recalculated the risk assessment for Down syndrome in the pregnancy but is very close to the previous risk assessment. New calculated risk for Down syndrome is 1 in 155 (0.6%), meaning there is 99.4% chance there is not Down syndrome in the pregnancy. Trisomy 18 recalculated risk was reduced from 1 in 10542 to 1 in 7578. Patient verbalized understanding and again stated she is comfortable with these estimates. She declined additional screening or testing for chromosome conditions in the pregnancy.  Clydie BraunKaren Martina Brodbeck 07/20/2016 10:02 AM

## 2016-07-24 ENCOUNTER — Ambulatory Visit (INDEPENDENT_AMBULATORY_CARE_PROVIDER_SITE_OTHER): Payer: Self-pay | Admitting: Obstetrics and Gynecology

## 2016-07-24 ENCOUNTER — Encounter: Payer: Self-pay | Admitting: Obstetrics and Gynecology

## 2016-07-24 DIAGNOSIS — O0992 Supervision of high risk pregnancy, unspecified, second trimester: Secondary | ICD-10-CM

## 2016-07-24 DIAGNOSIS — O0990 Supervision of high risk pregnancy, unspecified, unspecified trimester: Secondary | ICD-10-CM | POA: Insufficient documentation

## 2016-07-24 DIAGNOSIS — O09292 Supervision of pregnancy with other poor reproductive or obstetric history, second trimester: Secondary | ICD-10-CM

## 2016-07-24 DIAGNOSIS — Z8632 Personal history of gestational diabetes: Secondary | ICD-10-CM

## 2016-07-24 DIAGNOSIS — O169 Unspecified maternal hypertension, unspecified trimester: Secondary | ICD-10-CM | POA: Insufficient documentation

## 2016-07-24 DIAGNOSIS — O10012 Pre-existing essential hypertension complicating pregnancy, second trimester: Secondary | ICD-10-CM

## 2016-07-24 LAB — POCT URINALYSIS DIP (DEVICE)
Bilirubin Urine: NEGATIVE
GLUCOSE, UA: NEGATIVE mg/dL
HGB URINE DIPSTICK: NEGATIVE
Ketones, ur: NEGATIVE mg/dL
NITRITE: NEGATIVE
PROTEIN: NEGATIVE mg/dL
SPECIFIC GRAVITY, URINE: 1.015 (ref 1.005–1.030)
UROBILINOGEN UA: 0.2 mg/dL (ref 0.0–1.0)
pH: 6.5 (ref 5.0–8.0)

## 2016-07-24 NOTE — Progress Notes (Signed)
   PRENATAL VISIT NOTE  Subjective:  [name] Perry is a 40 y.o. [id number] at 7739w2d being seen today for ongoing prenatal care transferred here [facility]. She is currently monitored for the following issues for this high-risk pregnancy and has AMA (advanced maternal age) multigravida 35+; Essential hypertension; Antepartum multigravida of advanced maternal age; 5620 weeks gestation of pregnancy; Supervision of high-risk pregnancy; and History of gestational diabetes in prior pregnancy, currently pregnant in second trimester on her problem list.  Patient reports no complaints.  Contractions: Not present. Vag. Bleeding: None.  Movement: Present. Denies leaking of fluid.   The following portions of the patient's history were reviewed and updated as appropriate: allergies, current medications, past family history, past medical history, past social history, past surgical history and problem list. Problem list updated. She was first diagnosed with essential hypertension by Dr. Gaynell FaceMarshall and has been on labetalol since 2016 pregnancy. She is only taking 100mg  q hs instead of the prescribed 100mg  bid. Last pregnancy she was A2 GDM on glyburide and had 9#13oz baby by NSVD.   Objective:   Vitals:   07/24/16 1545  BP: 121/80  Pulse: 72  Weight: 155 lb (70.3 kg)    Fetal Status: Fetal Heart Rate (bpm): 150   Movement: Present     General:  Alert, oriented and cooperative. Patient is in no acute distress.  Skin: Skin is warm and dry. No rash noted.   Cardiovascular: Normal heart rate noted  Respiratory: Normal respiratory effort, no problems with respiration noted  Abdomen: Soft, gravid, appropriate for gestational age. Pain/Pressure: Present     Pelvic:  rt groin discomfort       Extremities: Normal range of motion.  Edema: None  Mental Status: Normal mood and affect. Normal behavior. Normal judgment and thought content.   Assessment and Plan:  Pregnancy: [id number] at  2939w2d  1. Pre-existing essential hypertension during pregnancy in second trimester Normotensive on low dose labetalol. Will continue as prescribed for now and obtain 24hr urine and CMP next visit. Start BASA 1/day  2. Supervision of high risk pregnancy in second trimester   Preterm labor symptoms and general obstetric precautions including but not limited to vaginal bleeding, contractions, leaking of fluid and fetal movement were reviewed in detail with the patient. Please refer to After Visit Summary for other counseling recommendations.  Return in about 2 weeks (around 08/07/2016).  Danae Orleanseirdre C Maximilliano Kersh, CNM

## 2016-07-24 NOTE — Patient Instructions (Signed)

## 2016-07-24 NOTE — Progress Notes (Signed)
Patient reports occasional pain in right groin

## 2016-07-27 ENCOUNTER — Other Ambulatory Visit: Payer: Self-pay

## 2016-07-27 DIAGNOSIS — O10012 Pre-existing essential hypertension complicating pregnancy, second trimester: Secondary | ICD-10-CM

## 2016-07-27 NOTE — Progress Notes (Signed)
Opened in Error.

## 2016-07-28 LAB — COMPREHENSIVE METABOLIC PANEL
ALT: 35 U/L — ABNORMAL HIGH (ref 6–29)
AST: 24 U/L (ref 10–30)
Albumin: 3.4 g/dL — ABNORMAL LOW (ref 3.6–5.1)
Alkaline Phosphatase: 62 U/L (ref 33–115)
BUN: 8 mg/dL (ref 7–25)
CHLORIDE: 103 mmol/L (ref 98–110)
CO2: 24 mmol/L (ref 20–31)
CREATININE: 0.57 mg/dL (ref 0.50–1.10)
Calcium: 8.7 mg/dL (ref 8.6–10.2)
Glucose, Bld: 94 mg/dL (ref 65–99)
Potassium: 3.8 mmol/L (ref 3.5–5.3)
SODIUM: 136 mmol/L (ref 135–146)
Total Bilirubin: 0.3 mg/dL (ref 0.2–1.2)
Total Protein: 6.5 g/dL (ref 6.1–8.1)

## 2016-07-28 LAB — PROTEIN, URINE, 24 HOUR
PROTEIN 24H UR: 279 mg/(24.h) — AB (ref <150)
PROTEIN, URINE: 12 mg/dL (ref 5–24)

## 2016-07-30 ENCOUNTER — Encounter: Payer: Self-pay | Admitting: Family Medicine

## 2016-08-13 ENCOUNTER — Other Ambulatory Visit: Payer: Self-pay | Admitting: Licensed Clinical Social Worker

## 2016-08-15 ENCOUNTER — Encounter: Payer: Self-pay | Admitting: Obstetrics and Gynecology

## 2016-08-20 ENCOUNTER — Encounter (HOSPITAL_COMMUNITY): Payer: Self-pay

## 2016-08-20 ENCOUNTER — Other Ambulatory Visit (HOSPITAL_COMMUNITY): Payer: Self-pay | Admitting: *Deleted

## 2016-08-20 ENCOUNTER — Ambulatory Visit (HOSPITAL_COMMUNITY)
Admission: RE | Admit: 2016-08-20 | Discharge: 2016-08-20 | Disposition: A | Discharge status: Home / Self Care | Payer: No Typology Code available for payment source | Source: Ambulatory Visit | Attending: Nurse Practitioner | Admitting: Nurse Practitioner

## 2016-08-20 DIAGNOSIS — O09292 Supervision of pregnancy with other poor reproductive or obstetric history, second trimester: Secondary | ICD-10-CM | POA: Insufficient documentation

## 2016-08-20 DIAGNOSIS — O283 Abnormal ultrasonic finding on antenatal screening of mother: Secondary | ICD-10-CM | POA: Insufficient documentation

## 2016-08-20 DIAGNOSIS — O09522 Supervision of elderly multigravida, second trimester: Secondary | ICD-10-CM | POA: Insufficient documentation

## 2016-08-20 DIAGNOSIS — O10012 Pre-existing essential hypertension complicating pregnancy, second trimester: Secondary | ICD-10-CM | POA: Insufficient documentation

## 2016-08-20 DIAGNOSIS — O09529 Supervision of elderly multigravida, unspecified trimester: Secondary | ICD-10-CM

## 2016-08-20 DIAGNOSIS — Z3A25 25 weeks gestation of pregnancy: Secondary | ICD-10-CM | POA: Insufficient documentation

## 2016-08-20 DIAGNOSIS — O0992 Supervision of high risk pregnancy, unspecified, second trimester: Secondary | ICD-10-CM

## 2016-08-21 ENCOUNTER — Other Ambulatory Visit: Payer: Self-pay | Admitting: Licensed Clinical Social Worker

## 2016-08-27 ENCOUNTER — Other Ambulatory Visit (INDEPENDENT_AMBULATORY_CARE_PROVIDER_SITE_OTHER): Payer: Self-pay | Admitting: Licensed Clinical Social Worker

## 2016-08-27 DIAGNOSIS — F3341 Major depressive disorder, recurrent, in partial remission: Secondary | ICD-10-CM

## 2016-08-27 DIAGNOSIS — F418 Other specified anxiety disorders: Secondary | ICD-10-CM

## 2016-08-28 NOTE — Progress Notes (Signed)
   THERAPY PROGRESS NOTE  Session Time: 60min  Participation Level: Active  Behavioral Response: CasualAlertEuthymic  Type of Therapy: Individual Therapy  Treatment Goals addressed: Coping  Interventions: Supportive  Summary: [name] DakinsFilomena Perry is a 40 y.o. female who presents with a euthymic mood and appropriate affect. She reported that she has been feeling better over the past week or two, as things have improved with her husband.  She expressed that she still feels occasionally upset by his affair, but that she has had to forgive him so that she can move on with her life. She shared that her husband is showing with his actions that he loves her and wants to make it up to her. [name] GibbsFilomena reported that her pregnancy is going well, and that she will be having a girl. She shared that the whole family is excited. She stated that while she had hopes of returning to work soon, she and her husband do not agree that daycare is the best option so she will probably stay home with the baby.   Suicidal/Homicidal: Nowithout intent/plan  Therapist Response: LCSW utilized supportive counseling techniques throughout the session in order to validate emotions and encourage open expression of emotion. LCSW and [name] GibbsFilomena processed about her feelings related to her husband's recent affair as well as her pregnancy.  Plan: Return again in 4 weeks.  Diagnosis: Axis I: See current hospital problem list    Axis II: No diagnosis    [name] Simmeratosha Anterrio Mccleery, LCSW 08/28/2016

## 2016-08-31 ENCOUNTER — Ambulatory Visit (INDEPENDENT_AMBULATORY_CARE_PROVIDER_SITE_OTHER): Payer: Self-pay | Admitting: Obstetrics & Gynecology

## 2016-08-31 VITALS — BP 112/64 | HR 79 | Wt 161.7 lb

## 2016-08-31 DIAGNOSIS — Z8632 Personal history of gestational diabetes: Secondary | ICD-10-CM

## 2016-08-31 DIAGNOSIS — O10012 Pre-existing essential hypertension complicating pregnancy, second trimester: Secondary | ICD-10-CM

## 2016-08-31 DIAGNOSIS — O09292 Supervision of pregnancy with other poor reproductive or obstetric history, second trimester: Secondary | ICD-10-CM

## 2016-08-31 DIAGNOSIS — O0992 Supervision of high risk pregnancy, unspecified, second trimester: Secondary | ICD-10-CM

## 2016-08-31 LAB — POCT URINALYSIS DIP (DEVICE)
Bilirubin Urine: NEGATIVE
GLUCOSE, UA: NEGATIVE mg/dL
HGB URINE DIPSTICK: NEGATIVE
KETONES UR: NEGATIVE mg/dL
Nitrite: NEGATIVE
PH: 8 (ref 5.0–8.0)
PROTEIN: 30 mg/dL — AB
SPECIFIC GRAVITY, URINE: 1.02 (ref 1.005–1.030)
UROBILINOGEN UA: 0.2 mg/dL (ref 0.0–1.0)

## 2016-08-31 NOTE — Progress Notes (Signed)
Video Interpreter # [id number] Pt will schedule 28 wk labs with 2 hour next week can not stay today

## 2016-08-31 NOTE — Progress Notes (Signed)
   PRENATAL VISIT NOTE  Subjective:  [name] Perry is a 40 y.o. [id number] at 8350w5d being seen today for ongoing prenatal care.  She is currently monitored for the following issues for this high-risk pregnancy and has AMA (advanced maternal age) multigravida 35+; Hypertension in pregnancy, essential, antepartum; Antepartum multigravida of advanced maternal age; Supervision of high-risk pregnancy; and History of gestational diabetes in prior pregnancy, currently pregnant in second trimester on her problem list.  Patient reports no complaints.  Contractions: Not present. Vag. Bleeding: None.  Movement: Present. Denies leaking of fluid.   The following portions of the patient's history were reviewed and updated as appropriate: allergies, current medications, past family history, past medical history, past social history, past surgical history and problem list. Problem list updated.  Objective:   Vitals:   08/31/16 0955  BP: 112/64  Pulse: 79  Weight: 161 lb 11.2 oz (73.3 kg)    Fetal Status: Fetal Heart Rate (bpm): 150   Movement: Present     General:  Alert, oriented and cooperative. Patient is in no acute distress.  Skin: Skin is warm and dry. No rash noted.   Cardiovascular: Normal heart rate noted  Respiratory: Normal respiratory effort, no problems with respiration noted  Abdomen: Soft, gravid, appropriate for gestational age. Pain/Pressure: Present     Pelvic:  Cervical exam deferred        Extremities: Normal range of motion.  Edema: None  Mental Status: Normal mood and affect. Normal behavior. Normal judgment and thought content.   Assessment and Plan:  Pregnancy: [id number] at 1350w5d  1. Pre-existing essential hypertension during pregnancy in second trimester Stable BP on Aspirin. Preeclampsia precautions reviewed.  2. History of gestational diabetes in prior pregnancy, currently pregnant in second trimester 2 hr GTT next visit.  3. Supervision of high risk pregnancy in  second trimester Preterm labor symptoms and general obstetric precautions including but not limited to vaginal bleeding, contractions, leaking of fluid and fetal movement were reviewed in detail with the patient. Please refer to After Visit Summary for other counseling recommendations.  Return in about 2 weeks (around 09/14/2016) for 2 hr GTT (fasting), 3rd trimester labs, TDap, OB Visit.   Tereso NewcomerUgonna A Jezel Basto, MD

## 2016-08-31 NOTE — Patient Instructions (Signed)
Regrese a la clinica cuando tenga su cita. Si tiene problemas o preguntas, llama a la clinica o vaya a la sala de [facility]encia al [facility].    

## 2016-09-05 ENCOUNTER — Encounter: Payer: Self-pay | Admitting: General Practice

## 2016-09-18 ENCOUNTER — Encounter (HOSPITAL_COMMUNITY): Payer: Self-pay

## 2016-09-18 ENCOUNTER — Other Ambulatory Visit (HOSPITAL_COMMUNITY): Payer: Self-pay | Admitting: Maternal and Fetal Medicine

## 2016-09-18 ENCOUNTER — Other Ambulatory Visit (HOSPITAL_COMMUNITY): Payer: Self-pay | Admitting: *Deleted

## 2016-09-18 ENCOUNTER — Ambulatory Visit (HOSPITAL_COMMUNITY)
Admission: RE | Admit: 2016-09-18 | Discharge: 2016-09-18 | Disposition: A | Discharge status: Home / Self Care | Payer: Self-pay | Source: Ambulatory Visit | Attending: Nurse Practitioner | Admitting: Nurse Practitioner

## 2016-09-18 DIAGNOSIS — O10013 Pre-existing essential hypertension complicating pregnancy, third trimester: Secondary | ICD-10-CM | POA: Insufficient documentation

## 2016-09-18 DIAGNOSIS — O09523 Supervision of elderly multigravida, third trimester: Secondary | ICD-10-CM | POA: Insufficient documentation

## 2016-09-18 DIAGNOSIS — O10919 Unspecified pre-existing hypertension complicating pregnancy, unspecified trimester: Secondary | ICD-10-CM

## 2016-09-18 DIAGNOSIS — Z3A29 29 weeks gestation of pregnancy: Secondary | ICD-10-CM

## 2016-09-18 DIAGNOSIS — Z3A39 39 weeks gestation of pregnancy: Secondary | ICD-10-CM | POA: Insufficient documentation

## 2016-09-18 DIAGNOSIS — O09293 Supervision of pregnancy with other poor reproductive or obstetric history, third trimester: Secondary | ICD-10-CM | POA: Insufficient documentation

## 2016-09-18 DIAGNOSIS — O09529 Supervision of elderly multigravida, unspecified trimester: Secondary | ICD-10-CM

## 2016-09-18 DIAGNOSIS — O10913 Unspecified pre-existing hypertension complicating pregnancy, third trimester: Secondary | ICD-10-CM

## 2016-09-18 DIAGNOSIS — O283 Abnormal ultrasonic finding on antenatal screening of mother: Secondary | ICD-10-CM | POA: Insufficient documentation

## 2016-09-19 ENCOUNTER — Ambulatory Visit (INDEPENDENT_AMBULATORY_CARE_PROVIDER_SITE_OTHER): Payer: Self-pay | Admitting: Family

## 2016-09-19 VITALS — BP 104/73 | HR 78 | Wt 164.0 lb

## 2016-09-19 DIAGNOSIS — O10013 Pre-existing essential hypertension complicating pregnancy, third trimester: Secondary | ICD-10-CM

## 2016-09-19 DIAGNOSIS — O09523 Supervision of elderly multigravida, third trimester: Secondary | ICD-10-CM

## 2016-09-19 DIAGNOSIS — Z23 Encounter for immunization: Secondary | ICD-10-CM

## 2016-09-19 DIAGNOSIS — O280 Abnormal hematological finding on antenatal screening of mother: Secondary | ICD-10-CM | POA: Insufficient documentation

## 2016-09-19 DIAGNOSIS — O10019 Pre-existing essential hypertension complicating pregnancy, unspecified trimester: Secondary | ICD-10-CM

## 2016-09-19 DIAGNOSIS — O0993 Supervision of high risk pregnancy, unspecified, third trimester: Secondary | ICD-10-CM

## 2016-09-19 LAB — CBC
HEMATOCRIT: 32.7 % — AB (ref 35.0–45.0)
Hemoglobin: 10.8 g/dL — ABNORMAL LOW (ref 11.7–15.5)
MCH: 29.5 pg (ref 27.0–33.0)
MCHC: 33 g/dL (ref 32.0–36.0)
MCV: 89.3 fL (ref 80.0–100.0)
MPV: 10.2 fL (ref 7.5–12.5)
Platelets: 275 10*3/uL (ref 140–400)
RBC: 3.66 MIL/uL — ABNORMAL LOW (ref 3.80–5.10)
RDW: 13.7 % (ref 11.0–15.0)
WBC: 8.8 10*3/uL (ref 3.8–10.8)

## 2016-09-19 LAB — 2HR GTT W 1 HR, CARPENTER, 75 G
Glucose, 1 Hr, Gest: 282 mg/dL — ABNORMAL HIGH (ref <180)
Glucose, 2 Hr, Gest: 270 mg/dL — ABNORMAL HIGH (ref <153)
Glucose, Fasting, Gest: 145 mg/dL — ABNORMAL HIGH (ref 65–91)

## 2016-09-19 NOTE — Progress Notes (Signed)
   PRENATAL VISIT NOTE  Subjective:  [name] DakinsFilomena Perry is a 40 y.o. [id number] at 3335w3d being seen today for ongoing prenatal care.  She is currently monitored for the following issues for this high-risk pregnancy and has AMA (advanced maternal age) multigravida 35+; Hypertension in pregnancy, essential, antepartum; Antepartum multigravida of advanced maternal age; Supervision of high-risk pregnancy; History of gestational diabetes in prior pregnancy, currently pregnant in second trimester; and Abnormal quad screen on her problem list.  Patient reports no complaints.  Contractions: Not present. Vag. Bleeding: None.  Movement: Present. Denies leaking of fluid.   The following portions of the patient's history were reviewed and updated as appropriate: allergies, current medications, past family history, past medical history, past social history, past surgical history and problem list. Problem list updated.  Objective:   Vitals:   09/19/16 0750  BP: 104/73  Pulse: 78  Weight: 164 lb (74.4 kg)    Fetal Status: Fetal Heart Rate (bpm): 155 Fundal Height: 29 cm Movement: Present     General:  Alert, oriented and cooperative. Patient is in no acute distress.  Skin: Skin is warm and dry. No rash noted.   Cardiovascular: Normal heart rate noted  Respiratory: Normal respiratory effort, no problems with respiration noted  Abdomen: Soft, gravid, appropriate for gestational age. Pain/Pressure: Absent     Pelvic:  Cervical exam deferred        Extremities: Normal range of motion.  Edema: None  Mental Status: Normal mood and affect. Normal behavior. Normal judgment and thought content.   Assessment and Plan:  Pregnancy: [id number] at 4035w3d  1. Hypertension in pregnancy, essential, antepartum - Continue ASA - Growth ultrasound scheduled  2. Elderly multigravida in third trimester - Increased DSR and T18 risk; declined amnio and NIPS  3. Supervision of high risk pregnancy in third trimester -  CBC - RPR - HIV antibody (with reflex) - 2Hr GTT w/ 1 Hr Carpenter 75 g - Tdap vaccine greater than or equal to 7yo IM  Preterm labor symptoms and general obstetric precautions including but not limited to vaginal bleeding, contractions, leaking of fluid and fetal movement were reviewed in detail with the patient. Please refer to After Visit Summary for other counseling recommendations.  Return in 2 weeks (on 10/03/2016).   Eino FarberWalidah Kennith GainN Karim, CNM

## 2016-09-19 NOTE — Patient Instructions (Signed)
Tercer trimestre de embarazo (Third Trimester of Pregnancy) El tercer trimestre comprende desde la semana29 hasta la semana42, es decir, desde el mes7 hasta el mes9. El tercer trimestre es un perodo en el que el feto crece rpidamente. Hacia el final del noveno mes, el feto mide alrededor de 20pulgadas (45cm) de largo y pesa entre 6 y 10 libras (2,700 y 4,500kg). CAMBIOS EN EL ORGANISMO Su organismo atraviesa por muchos cambios durante el embarazo, y estos varan de una mujer a otra.  Seguir aumentando de peso. Es de esperar que aumente entre 25 y 35libras (11 y 16kg) hacia el final del embarazo.  Podrn aparecer las primeras estras en las caderas, el abdomen y las mamas.  Puede tener necesidad de orinar con ms frecuencia porque el feto baja hacia la pelvis y ejerce presin sobre la vejiga.  Debido al embarazo podr sentir acidez estomacal con frecuencia.  Puede estar estreida, ya que ciertas hormonas enlentecen los movimientos de los msculos que empujan los desechos a travs de los intestinos.  Pueden aparecer hemorroides o abultarse e hincharse las venas (venas varicosas).  Puede sentir dolor plvico debido al aumento de peso y a que las hormonas del embarazo relajan las articulaciones entre los huesos de la pelvis. El dolor de espalda puede ser consecuencia de la sobrecarga de los msculos que soportan la postura.  Tal vez haya cambios en el cabello que pueden incluir su engrosamiento, crecimiento rpido y cambios en la textura. Adems, a algunas mujeres se les cae el cabello durante o despus del embarazo, o tienen el cabello seco o fino. Lo ms probable es que el cabello se le normalice despus del nacimiento del beb.  Las mamas seguirn creciendo y le dolern. A veces, puede haber una secrecin amarilla de las mamas llamada calostro.  El ombligo puede salir hacia afuera.  Puede sentir que le falta el aire debido a que se expande el tero.  Puede notar que el feto  "baja" o lo siente ms bajo, en el abdomen.  Puede tener una prdida de secrecin mucosa con sangre. Esto suele ocurrir en el trmino de unos pocos das a una semana antes de que comience el trabajo de parto.  El cuello del tero se vuelve delgado y blando (se borra) cerca de la fecha de parto. QU DEBE ESPERAR EN LOS EXMENES PRENATALES Le harn exmenes prenatales cada 2semanas hasta la semana36. A partir de ese momento le harn exmenes semanales. Durante una visita prenatal de rutina:  La pesarn para asegurarse de que usted y el feto estn creciendo normalmente.  Le tomarn la presin arterial.  Le medirn el abdomen para controlar el desarrollo del beb.  Se escucharn los latidos cardacos fetales.  Se evaluarn los resultados de los estudios solicitados en visitas anteriores.  Le revisarn el cuello del tero cuando est prxima la fecha de parto para controlar si este se ha borrado. Alrededor de la semana36, el mdico le revisar el cuello del tero. Al mismo tiempo, realizar un anlisis de las secreciones del tejido vaginal. Este examen es para determinar si hay un tipo de bacteria, estreptococo Grupo B. El mdico le explicar esto con ms detalle. El mdico puede preguntarle lo siguiente:  Cmo le gustara que fuera el parto.  Cmo se siente.  Si siente los movimientos del beb.  Si ha tenido sntomas anormales, como prdida de lquido, sangrado, dolores de cabeza intensos o clicos abdominales.  Si est consumiendo algn producto que contenga tabaco, como cigarrillos, tabaco de mascar y   cigarrillos electrnicos.  Si tiene alguna pregunta. Otros exmenes o estudios de deteccin que pueden realizarse durante el tercer trimestre incluyen lo siguiente:  Anlisis de sangre para controlar los niveles de hierro (anemia).  Controles fetales para determinar su salud, nivel de actividad y crecimiento. Si tiene alguna enfermedad o hay problemas durante el embarazo, le harn  estudios.  Prueba del VIH (virus de inmunodeficiencia humana). Si corre un riesgo alto, pueden realizarle una prueba de deteccin del VIH durante el tercer trimestre del embarazo. FALSO TRABAJO DE PARTO Es posible que sienta contracciones leves e irregulares que finalmente desaparecen. Se llaman contracciones de Braxton Hicks o falso trabajo de parto. Las contracciones pueden durar horas, das o incluso semanas, antes de que el verdadero trabajo de parto se inicie. Si las contracciones ocurren a intervalos regulares, se intensifican o se hacen dolorosas, lo mejor es que la revise el mdico. SIGNOS DE TRABAJO DE PARTO  Clicos de tipo menstrual.  Contracciones cada 5minutos o menos.  Contracciones que comienzan en la parte superior del tero y se extienden hacia abajo, a la zona inferior del abdomen y la espalda.  Sensacin de mayor presin en la pelvis o dolor de espalda.  Una secrecin de mucosidad acuosa o con sangre que sale de la vagina. Si tiene alguno de estos signos antes de la semana37 del embarazo, llame a su mdico de inmediato. Debe concurrir al hospital para que la controlen inmediatamente. INSTRUCCIONES PARA EL CUIDADO EN EL HOGAR  Evite fumar, consumir hierbas, beber alcohol y tomar frmacos que no le hayan recetado. Estas sustancias qumicas afectan la formacin y el desarrollo del beb.  No consuma ningn producto que contenga tabaco, lo que incluye cigarrillos, tabaco de mascar y cigarrillos electrnicos. Si necesita ayuda para dejar de fumar, consulte al mdico. Puede recibir asesoramiento y otro tipo de recursos para dejar de fumar.  Siga las indicaciones del mdico en relacin con el uso de medicamentos. Durante el embarazo, hay medicamentos que son seguros de tomar y otros que no.  Haga ejercicio solamente como se lo haya indicado el mdico. Sentir clicos uterinos es un buen signo para detener la actividad fsica.  Contine comiendo alimentos sanos con  regularidad.  Use un sostn que le brinde buen soporte si le duelen las mamas.  No se d baos de inmersin en agua caliente, baos turcos ni saunas.  Use el cinturn de seguridad en todo momento mientras conduce.  No coma carne cruda ni queso sin cocinar; evite el contacto con las bandejas sanitarias de los gatos y la tierra que estos animales usan. Estos elementos contienen grmenes que pueden causar defectos congnitos en el beb.  Tome las vitaminas prenatales.  Tome entre 1500 y 2000mg de calcio diariamente comenzando en la semana20 del embarazo hasta el parto.  Si est estreida, pruebe un laxante suave (si el mdico lo autoriza). Consuma ms alimentos ricos en fibra, como vegetales y frutas frescos y cereales integrales. Beba gran cantidad de lquido para mantener la orina de tono claro o color amarillo plido.  Dese baos de asiento con agua tibia para aliviar el dolor o las molestias causadas por las hemorroides. Use una crema para las hemorroides si el mdico la autoriza.  Si tiene venas varicosas, use medias de descanso. Eleve los pies durante 15minutos, 3 o 4veces por da. Limite el consumo de sal en su dieta.  Evite levantar objetos pesados, use zapatos de tacones bajos y mantenga una buena postura.  Descanse con las piernas elevadas si tiene   calambres o dolor de cintura.  Visite a su dentista si no lo ha hecho durante el embarazo. Use un cepillo de dientes blando para higienizarse los dientes y psese el hilo dental con suavidad.  Puede seguir manteniendo relaciones sexuales, a menos que el mdico le indique lo contrario.  No haga viajes largos excepto que sea absolutamente necesario y solo con la autorizacin del mdico.  Tome clases prenatales para entender, practicar y hacer preguntas sobre el trabajo de parto y el parto.  Haga un ensayo de la partida al hospital.  Prepare el bolso que llevar al hospital.  Prepare la habitacin del beb.  Concurra a todas  las visitas prenatales segn las indicaciones de su mdico.  SOLICITE ATENCIN MDICA SI:  No est segura de que est en trabajo de parto o de que ha roto la bolsa de las aguas.  Tiene mareos.  Siente clicos leves, presin en la pelvis o dolor persistente en el abdomen.  Tiene nuseas, vmitos o diarrea persistentes.  Observa una secrecin vaginal con mal olor.  Siente dolor al orinar.  SOLICITE ATENCIN MDICA DE INMEDIATO SI:  Tiene fiebre.  Tiene una prdida de lquido por la vagina.  Tiene sangrado o pequeas prdidas vaginales.  Siente dolor intenso o clicos en el abdomen.  Sube o baja de peso rpidamente.  Tiene dificultad para respirar y siente dolor de pecho.  Sbitamente se le hinchan mucho el rostro, las manos, los tobillos, los pies o las piernas.  No ha sentido los movimientos del beb durante una hora.  Siente un dolor de cabeza intenso que no se alivia con medicamentos.  Su visin se modifica.  Esta informacin no tiene como fin reemplazar el consejo del mdico. Asegrese de hacerle al mdico cualquier pregunta que tenga. Document Released: 06/20/2005 Document Revised: 10/01/2014 Document Reviewed: 11/11/2012 Elsevier Interactive Patient Education  2017 Elsevier Inc.  

## 2016-09-20 LAB — RPR

## 2016-09-20 LAB — HIV ANTIBODY (ROUTINE TESTING W REFLEX): HIV: NONREACTIVE

## 2016-09-23 ENCOUNTER — Encounter: Payer: Self-pay | Admitting: Family

## 2016-09-23 ENCOUNTER — Telehealth: Payer: Self-pay | Admitting: Family

## 2016-09-23 DIAGNOSIS — O24419 Gestational diabetes mellitus in pregnancy, unspecified control: Secondary | ICD-10-CM | POA: Insufficient documentation

## 2016-09-23 MED ORDER — GLYBURIDE 2.5 MG PO TABS: 2.5000 mg | ORAL_TABLET | Freq: Every day | ORAL | 0 refills | Status: DC

## 2016-09-23 NOTE — Telephone Encounter (Signed)
Reviewed 2 hr results with Dr. Alysia PennaErvin; recommended beginning glyburide 2.5 mg HS and having patient come in on Tuesday for meter/strips.  Pt called via interpreter with language line and informed that RX sent to pharmacy.  Will route message to office staff to have patient come in on Tuesday (09/25/16); 1/1 office is closed.

## 2016-09-24 NOTE — L&D Delivery Note (Signed)
Vaginal Delivery Note  41 y.o. [id number] at 2348w3d delivered a viable infant girl at 1346 in cephalic, LOA position. One nuchal cord easily reduced. Right anterior shoulder delivered with ease. Sixty sec delayed cord clamping. Cord clamped x2 and cut. Placenta delivered spontaneously intact, with 3VC. Fundus firm on exam with massage and pitocin.  Mother: Anesthesia: None Laceration: 1st degree perineal x1 Suture repair: x2 Vicryl EBL: 350 mL  Baby: Apgars: 8, 9 Weight: Pending Cord pH: Not sent  Good hemostasis noted. Mom to postpartum.  Baby to Couplet care / Skin to Skin.  Wendee Beaversavid J McMullen, DO, PGY-1 11/21/2016, 2:05 PM  OB FELLOW DELIVERY ATTESTATION  I was gloved and present for the delivery in its entirety, and I agree with the above resident's note.    Ernestina PennaNicholas Zyden Suman, MD 2:38 PM

## 2016-09-25 ENCOUNTER — Telehealth: Payer: Self-pay | Admitting: *Deleted

## 2016-09-25 DIAGNOSIS — O24419 Gestational diabetes mellitus in pregnancy, unspecified control: Secondary | ICD-10-CM

## 2016-09-25 NOTE — Telephone Encounter (Signed)
Per message from Rochele PagesWalidah Karim, CNM- 2 hr gtt really high. Needs to come in for diabetes education/ get meter 09/25/16 and hgb A1C.  Needs follow up in 1 week. rx sent to pharmacy for glyburide. Called MFM to schedule diabetes education this week- no slot available. No slot available 10/01/16 in our office. [place] [facility] [place] [phone] to schedule in their office. They have an opening tomorrow at 0900.  At [address]. I called [name] with [employer] interpreter [phone] and left a message we are calling with some information- please call our office. When she calls we need to 1) schedule her to come if hgb A1C asap.  2) need to see if she can go to diabetes education tomorrow or reschedule for there or our office  3) can have her come to our office to get meter since she had GDM before and hopefully remembers basics.

## 2016-09-27 ENCOUNTER — Other Ambulatory Visit: Payer: Self-pay | Admitting: Licensed Clinical Social Worker

## 2016-09-27 NOTE — Telephone Encounter (Addendum)
Called [name] with [employer]preter [phone] and notified her of need to come to office to get lab drawn and get meter. She states she already got the medicine and started it and agrees to come in 09/28/16 10:00 for hgb A1c/get meter.  We also discussed when she can come in for diabetes education- she states any day next week.   I called and [facility]cheduled [facility] for 10/02/16 10:00 and called her to notify. She agreed to that appointment as well.

## 2016-09-28 ENCOUNTER — Other Ambulatory Visit: Payer: Self-pay

## 2016-10-02 ENCOUNTER — Encounter: Payer: Self-pay | Admitting: Dietician

## 2016-10-02 ENCOUNTER — Encounter: Payer: Self-pay | Attending: Obstetrics & Gynecology | Admitting: Dietician

## 2016-10-02 DIAGNOSIS — Z713 Dietary counseling and surveillance: Secondary | ICD-10-CM | POA: Insufficient documentation

## 2016-10-02 DIAGNOSIS — O24419 Gestational diabetes mellitus in pregnancy, unspecified control: Secondary | ICD-10-CM | POA: Insufficient documentation

## 2016-10-02 DIAGNOSIS — Z3A00 Weeks of gestation of pregnancy not specified: Secondary | ICD-10-CM | POA: Insufficient documentation

## 2016-10-02 NOTE — Patient Instructions (Signed)
Buy [facility] Meter at [facility] [employer] [employer] Small meals with protein and carbohydrate Avoid drinks and foods with added sugar. Avoid Juice. Check your blood sugar as we discussed

## 2016-10-02 NOTE — Progress Notes (Signed)
Diabetes Self-Management Education  Visit Type: First/Initial  Appt. Start Time: 1110 Appt. End Time: 1235 [name]  Interpretors used during the appointment included [name] Perry [phone]).  Internet connection lost and interpretor changed to [name] Perry [phone] from [place] interpretors via phone.  10/02/2016  [name] Perry, identified by name and date of birth, is a 41 y.o. female with a diagnosis of Diabetes: Gestational Diabetes.  Hx of GDM during her last pregnancy as well and she currently has HTN.  She is about 30-[redacted] weeks gestation.  Patient lvies with her husband and 3 children.  She does the cooking and shopping.  She was proved with a meter and was able to demonstrate proper procedure for CBG monitoring.  A book to keep record was also provided.  Her fasting blood sugar was 129 today.  ASSESSMENT  Height 5' 2.99" (1.6 m), weight 165 lb (74.8 kg), last menstrual period 02/16/2016, unknown if currently breastfeeding. Body mass index is 29.24 kg/m.      Diabetes Self-Management Education - 10/02/16 1127      Visit Information   Visit Type First/Initial     Initial Visit   Diabetes Type Gestational Diabetes   Are you currently following a meal plan? Yes  avoiding cookies and breads   What type of meal plan do you follow? avoiding cookies and breads   Are you taking your medications as prescribed? Yes   Date Diagnosed 08/2016  hx of previous GDM      Health Coping   How would you rate your overall health? Good     Psychosocial Assessment   Patient Belief/Attitude about Diabetes Motivated to manage diabetes   Self-care barriers English as a second language   Self-management support Doctor's office;Friends   Other persons present Patient;Friend   Patient Concerns Nutrition/Meal planning;Glycemic Control   Special Needs Other (comment)  interpretor and spanish material   Preferred Learning Style No preference indicated   Learning Readiness Ready   How often do you need to have someone help you when you read instructions, pamphlets, or other written materials from your doctor or pharmacy? 4 - Often   What is the last grade level you completed in school? 12th grade     Pre-Education Assessment   Patient understands the diabetes disease and treatment process. Needs Review   Patient understands incorporating nutritional management into lifestyle. Needs Review   Patient undertands incorporating physical activity into lifestyle. Needs Review   Patient understands using medications safely. Needs Review   Patient understands monitoring blood glucose, interpreting and using results Needs Instruction   Patient understands prevention, detection, and treatment of acute complications. Needs Instruction   Patient understands prevention, detection, and treatment of chronic complications. Needs Instruction   Patient understands how to develop strategies to address psychosocial issues. Needs Instruction   Patient understands how to develop strategies to promote health/change behavior. Needs Instruction     Complications   How often do you check your blood sugar? 0 times/day (not testing)     Dietary Intake   Breakfast tortilla, cheese, apple   Snack (morning) apple   Lunch rice, beans OR soup   Snack (afternoon) none   Dinner none   Snack (evening) none   Beverage(s) 2-3 bottles of water per day     Exercise   Exercise Type ADL's  walks but has been too cold recently     Patient Education   Previous Diabetes Education Yes (please comment)  other pregnancy with GDM   Disease state  Factors that contribute to the development of diabetes   Nutrition management  Role of diet in the treatment of diabetes and the relationship between the three main macronutrients and blood glucose level;Meal options for control of blood glucose level and chronic complications.;Food label reading, portion sizes and measuring food.    Physical activity and exercise  Role of exercise on diabetes management, blood pressure control and cardiac health.   Medications Other (comment)  patient reports taking medication as prescribed   Monitoring Other (comment)  Provided One Touch Verio Flex meter lot [id number] x exp 02/21/17   Acute complications Taught treatment of hypoglycemia - the 15 rule.   Chronic complications Relationship between chronic complications and blood glucose control   Psychosocial adjustment Role of stress on diabetes;Worked with patient to identify barriers to care and solutions;Identified and addressed patients feelings and concerns about diabetes   Preconception care Reviewed with patient blood glucose goals with pregnancy     Individualized Goals (developed by patient)   Nutrition General guidelines for healthy choices and portions discussed   Physical Activity Exercise 5-7 days per week;30 minutes per day   Medications take my medication as prescribed   Monitoring  test my blood glucose as discussed   Reducing Risk examine blood glucose patterns;treat hypoglycemia with 15 grams of carbs if blood glucose less than 70mg /dL   Health Coping discuss diabetes with (comment)  MD/RD     Post-Education Assessment   Patient understands the diabetes disease and treatment process. Demonstrates understanding / competency   Patient understands incorporating nutritional management into lifestyle. Demonstrates understanding / competency   Patient undertands incorporating physical activity into lifestyle. Demonstrates understanding / competency   Patient understands using medications safely. Demonstrates understanding / competency   Patient understands monitoring blood glucose, interpreting and using results Demonstrates understanding / competency   Patient understands prevention, detection, and treatment of acute complications. Demonstrates understanding / competency   Patient understands prevention, detection, and  treatment of chronic complications. Demonstrates understanding / competency   Patient understands how to develop strategies to address psychosocial issues. Demonstrates understanding / competency   Patient understands how to develop strategies to promote health/change behavior. Demonstrates understanding / competency     Outcomes   Expected Outcomes Demonstrated interest in learning. Expect positive outcomes   Future DMSE PRN   Program Status Completed      Individualized Plan for Diabetes Self-Management Training:   Learning Objective:  Patient will have a greater understanding of diabetes self-management. Patient education plan is to attend individual and/or group sessions per assessed needs and concerns.   Plan:   Patient Instructions  Buy Relion Meter at [employer] Small meals with protein and carbohydrate Avoid drinks and foods with added sugar. Avoid Juice. Check your blood sugar as we discussed   Expected Outcomes:  Demonstrated interest in learning. Expect positive outcomes  Education material provided: Meal plan card and My Plate and Gestation Diabetes handout all in Spanish.  If problems or questions, patient to contact team via:  Phone   Future DSME appointment: PRN

## 2016-10-11 ENCOUNTER — Encounter: Payer: Self-pay | Admitting: Family Medicine

## 2016-10-15 ENCOUNTER — Encounter: Payer: Self-pay | Admitting: Obstetrics & Gynecology

## 2016-10-16 ENCOUNTER — Ambulatory Visit (HOSPITAL_COMMUNITY)
Admission: RE | Admit: 2016-10-16 | Discharge: 2016-10-16 | Disposition: A | Discharge status: Home / Self Care | Payer: Self-pay | Source: Ambulatory Visit | Attending: Nurse Practitioner | Admitting: Nurse Practitioner

## 2016-10-16 ENCOUNTER — Encounter (HOSPITAL_COMMUNITY): Payer: Self-pay

## 2016-10-16 ENCOUNTER — Other Ambulatory Visit (HOSPITAL_COMMUNITY): Payer: Self-pay | Admitting: Maternal and Fetal Medicine

## 2016-10-16 VITALS — BP 109/73 | HR 64 | Wt 169.4 lb

## 2016-10-16 DIAGNOSIS — O09523 Supervision of elderly multigravida, third trimester: Secondary | ICD-10-CM

## 2016-10-16 DIAGNOSIS — O24415 Gestational diabetes mellitus in pregnancy, controlled by oral hypoglycemic drugs: Secondary | ICD-10-CM | POA: Insufficient documentation

## 2016-10-16 DIAGNOSIS — Z3A33 33 weeks gestation of pregnancy: Secondary | ICD-10-CM

## 2016-10-16 DIAGNOSIS — O289 Unspecified abnormal findings on antenatal screening of mother: Secondary | ICD-10-CM

## 2016-10-16 DIAGNOSIS — O09293 Supervision of pregnancy with other poor reproductive or obstetric history, third trimester: Secondary | ICD-10-CM | POA: Insufficient documentation

## 2016-10-16 DIAGNOSIS — O10013 Pre-existing essential hypertension complicating pregnancy, third trimester: Secondary | ICD-10-CM

## 2016-10-16 DIAGNOSIS — O283 Abnormal ultrasonic finding on antenatal screening of mother: Secondary | ICD-10-CM | POA: Insufficient documentation

## 2016-10-16 DIAGNOSIS — O10913 Unspecified pre-existing hypertension complicating pregnancy, third trimester: Secondary | ICD-10-CM

## 2016-10-17 ENCOUNTER — Ambulatory Visit (INDEPENDENT_AMBULATORY_CARE_PROVIDER_SITE_OTHER): Payer: Self-pay | Admitting: Obstetrics and Gynecology

## 2016-10-17 VITALS — BP 107/66 | HR 65 | Wt 169.4 lb

## 2016-10-17 DIAGNOSIS — O09523 Supervision of elderly multigravida, third trimester: Secondary | ICD-10-CM

## 2016-10-17 DIAGNOSIS — O24419 Gestational diabetes mellitus in pregnancy, unspecified control: Secondary | ICD-10-CM

## 2016-10-17 DIAGNOSIS — O10019 Pre-existing essential hypertension complicating pregnancy, unspecified trimester: Secondary | ICD-10-CM

## 2016-10-17 DIAGNOSIS — O10013 Pre-existing essential hypertension complicating pregnancy, third trimester: Secondary | ICD-10-CM

## 2016-10-17 DIAGNOSIS — O0993 Supervision of high risk pregnancy, unspecified, third trimester: Secondary | ICD-10-CM

## 2016-10-17 LAB — POCT URINALYSIS DIP (DEVICE)
BILIRUBIN URINE: NEGATIVE
GLUCOSE, UA: NEGATIVE mg/dL
Hgb urine dipstick: NEGATIVE
Ketones, ur: NEGATIVE mg/dL
NITRITE: NEGATIVE
Protein, ur: 30 mg/dL — AB
SPECIFIC GRAVITY, URINE: 1.015 (ref 1.005–1.030)
UROBILINOGEN UA: 0.2 mg/dL (ref 0.0–1.0)
pH: 7 (ref 5.0–8.0)

## 2016-10-17 MED ORDER — GLYBURIDE 2.5 MG PO TABS: 5.0000 mg | ORAL_TABLET | Freq: Every day | ORAL | 0 refills | Status: DC

## 2016-10-17 NOTE — Progress Notes (Signed)
   PRENATAL VISIT NOTE  Subjective:  [name] DakinsFilomena Mejia-Perez is a 41 y.o. [id number] at 9182w3d being seen today for ongoing prenatal care.  She is currently monitored for the following issues for this high-risk pregnancy and has AMA (advanced maternal age) multigravida 35+; Hypertension in pregnancy, essential, antepartum; Antepartum multigravida of advanced maternal age; Supervision of high-risk pregnancy; History of gestational diabetes in prior pregnancy, currently pregnant in second trimester; Abnormal quad screen; and Gestational diabetes mellitus (GDM) affecting pregnancy on her problem list.  Patient reports no complaints.  Contractions: Not present. Vag. Bleeding: None.  Movement: Present. Denies leaking of fluid.   The following portions of the patient's history were reviewed and updated as appropriate: allergies, current medications, past family history, past medical history, past social history, past surgical history and problem list. Problem list updated.  Objective:   Vitals:   10/17/16 0955  BP: 107/66  Pulse: 65  Weight: 169 lb 6.4 oz (76.8 kg)    Fetal Status: Fetal Heart Rate (bpm): 152   Movement: Present     General:  Alert, oriented and cooperative. Patient is in no acute distress.  Skin: Skin is warm and dry. No rash noted.   Cardiovascular: Normal heart rate noted  Respiratory: Normal respiratory effort, no problems with respiration noted  Abdomen: Soft, gravid, appropriate for gestational age. Pain/Pressure: Absent     Pelvic:  Cervical exam deferred        Extremities: Normal range of motion.  Edema: None  Mental Status: Normal mood and affect. Normal behavior. Normal judgment and thought content.   Assessment and Plan:  Pregnancy: [id number] at 2182w3d  1. Gestational diabetes mellitus (GDM) affecting pregnancy -Fetal nonstress test, start twice weekly testing. -NST reactive today -non of fasting sugars have been at goal. All greater tan 100. 50% of post prandial  sugars at goals.  -increase glyburide to 5mg  at bedtime.  -consider adding 2.5 mg in AM at next visit.   2. Hypertension in pregnancy, essential, antepartum - Fetal nonstress test -continue labetolol -Bp controlled  3. Elderly multigravida in third trimester   4. Supervision of high risk pregnancy in third trimester -doing well, no complaints.   Preterm labor symptoms and general obstetric precautions including but not limited to vaginal bleeding, contractions, leaking of fluid and fetal movement were reviewed in detail with the patient. Please refer to After Visit Summary for other counseling recommendations.  Return in about 5 days (around 10/22/2016) for NST/AFI;  2/1 Ob fu and NST.   Lorne SkeensNicholas Michael Schenk, MD

## 2016-10-17 NOTE — Progress Notes (Signed)
[employer] video interpreter Onalee HuaDavid # [phone] used for encounter with Dr. Genevie AnnSchenk and NST

## 2016-10-17 NOTE — Patient Instructions (Signed)
Cardiotocografía en reposo °(Nonstress Test) ° La cardiotocografía en reposo es un procedimiento que controla los latidos del corazón del feto. El estudio controla los latidos del corazón cuando el feto está en reposo y mientras se mueve. En un feto sano, habrá un aumento de la frecuencia cardíaca fetal cuando se mueve o patea. La frecuencia cardíaca disminuye en reposo. Este examen ayuda a determinar si el feto está sano. El médico va a revisar una serie de patrones en el ritmo cardíaco para asegurarse de que el bebé está creciendo bien. Si hay alguna preocupación, el médico puede ordenar pruebas adicionales o puede sugerir otro curso de acción. Este estudio se hace en el tercer trimestre del embarazo y puede ayudar a determinar si es seguro y necesario realizar un parto anticipado. Las razones más comunes para hacer este examen son:  °· Ya ha pasado la fecha del parto. °· Tiene un embarazo de alto riesgo. °· Siente menos movimientos que lo habitual. °· Ha perdido un embarazo en el pasado. °· El médico sospecha problemas en el desarrollo fetal. °· Tiene mucho o muy poco líquido amniótico. °ANTES DEL PROCEDIMIENTO  °· Coma un alimento justo antes del examen o como se lo indique su médico. Los alimentos ayudan a estimular los movimientos fetales. °· Vaya al baño justo antes del estudio. °PROCEDIMIENTO  °· Le colocarán dos cinturones alrededor del abdomen. Estos cinturones tienen monitores conectados a ellos. Uno registra la frecuencia cardíaca fetal y el otro las contracciones uterinas. °· Le indicarán que se acueste de lado o permanezca sentada en posición vertical. °· Le darán un pulsador para presionar cuando sienta el movimiento. °· Se escucharán los latidos cardíacos del feto y se observará en una pantalla. Los latidos cardíacos se registran en un papel. °· Si el feto parece estar dormido, se le puede pedir que beba un poco de jugo o soda, presione suavemente su abdomen, o haga algo de ruido para  despertarlo. °DESPUÉS DEL PROCEDIMIENTO  °El médico le explicará los resultados del estudio y le hará recomendaciones para el futuro próximo.  °Esta información no tiene como fin reemplazar el consejo del médico. Asegúrese de hacerle al médico cualquier pregunta que tenga. °Document Released: 09/10/2005 Document Revised: 10/01/2014 °Elsevier Interactive Patient Education © 2017 Elsevier Inc. ° ° °

## 2016-10-25 ENCOUNTER — Ambulatory Visit (INDEPENDENT_AMBULATORY_CARE_PROVIDER_SITE_OTHER): Payer: Self-pay | Admitting: Obstetrics and Gynecology

## 2016-10-25 ENCOUNTER — Other Ambulatory Visit: Payer: Self-pay | Admitting: Licensed Clinical Social Worker

## 2016-10-25 ENCOUNTER — Ambulatory Visit: Payer: Self-pay

## 2016-10-25 VITALS — BP 125/78 | HR 58 | Wt 167.9 lb

## 2016-10-25 DIAGNOSIS — O09293 Supervision of pregnancy with other poor reproductive or obstetric history, third trimester: Secondary | ICD-10-CM

## 2016-10-25 DIAGNOSIS — O0993 Supervision of high risk pregnancy, unspecified, third trimester: Secondary | ICD-10-CM

## 2016-10-25 DIAGNOSIS — O10019 Pre-existing essential hypertension complicating pregnancy, unspecified trimester: Secondary | ICD-10-CM

## 2016-10-25 DIAGNOSIS — O10013 Pre-existing essential hypertension complicating pregnancy, third trimester: Secondary | ICD-10-CM

## 2016-10-25 DIAGNOSIS — O09523 Supervision of elderly multigravida, third trimester: Secondary | ICD-10-CM

## 2016-10-25 DIAGNOSIS — O280 Abnormal hematological finding on antenatal screening of mother: Secondary | ICD-10-CM

## 2016-10-25 DIAGNOSIS — Z8632 Personal history of gestational diabetes: Secondary | ICD-10-CM

## 2016-10-25 DIAGNOSIS — O09529 Supervision of elderly multigravida, unspecified trimester: Secondary | ICD-10-CM

## 2016-10-25 DIAGNOSIS — O09292 Supervision of pregnancy with other poor reproductive or obstetric history, second trimester: Secondary | ICD-10-CM

## 2016-10-25 DIAGNOSIS — O24419 Gestational diabetes mellitus in pregnancy, unspecified control: Secondary | ICD-10-CM

## 2016-10-25 LAB — POCT URINALYSIS DIP (DEVICE)
Bilirubin Urine: NEGATIVE
Glucose, UA: NEGATIVE mg/dL
HGB URINE DIPSTICK: NEGATIVE
Ketones, ur: NEGATIVE mg/dL
Nitrite: NEGATIVE
PROTEIN: NEGATIVE mg/dL
SPECIFIC GRAVITY, URINE: 1.01 (ref 1.005–1.030)
UROBILINOGEN UA: 0.2 mg/dL (ref 0.0–1.0)
pH: 6.5 (ref 5.0–8.0)

## 2016-10-25 NOTE — Progress Notes (Signed)
Prenatal Visit Note Date: 10/25/2016 [facility]  Subjective:  [name] DakinsFilomena Mejia-Perez is a 41 y.o. [id number] at 5964w4d being seen today for ongoing prenatal care.  She is currently monitored for the following issues for this high-risk pregnancy and has AMA (advanced maternal age) multigravida 35+; Hypertension in pregnancy, essential, antepartum; Antepartum multigravida of advanced maternal age; Supervision of high-risk pregnancy; History of gestational diabetes in prior pregnancy, currently pregnant in second trimester; Abnormal quad screen; and Gestational diabetes mellitus (GDM) affecting pregnancy on her problem list.  Patient reports no complaints.   Contractions: Not present. Vag. Bleeding: None.  Movement: Present. Denies leaking of fluid.   The following portions of the patient's history were reviewed and updated as appropriate: allergies, current medications, past family history, past medical history, past social history, past surgical history and problem list. Problem list updated.  Objective:   Vitals:   10/25/16 1034  BP: 125/78  Pulse: (!) 58  Weight: 167 lb 14.4 oz (76.2 kg)    Fetal Status: Fetal Heart Rate (bpm): nst   Movement: Present     General:  Alert, oriented and cooperative. Patient is in no acute distress.  Skin: Skin is warm and dry. No rash noted.   Cardiovascular: Normal heart rate noted  Respiratory: Normal respiratory effort, no problems with respiration noted  Abdomen: Soft, gravid, appropriate for gestational age. Pain/Pressure: Present     Pelvic:  Cervical exam deferred        Extremities: Normal range of motion.  Edema: None  Mental Status: Normal mood and affect. Normal behavior. Normal judgment and thought content.   Urinalysis:      Assessment and Plan:  Pregnancy: [id number] at 5564w4d  1. Supervision of high risk pregnancy in third trimester Routine care. Declines BTL  2. History of gestational diabetes in prior  pregnancy, currently pregnant in second trimester Continue with glyburide 5mg  qhs. Forgot log book but gives good AM fasting and 2hr PP values. LGA fetus on 1/24 with normal AFI but large AC, too. Rpt NST/AFI today and continue with 2x/week testing rNST 135 baseline, +accels, no decel, mod variability, toco q828m UC x 7171m  3. Antepartum multigravida of advanced maternal age See below  4. Abnormal quad screen [facility] and declined any other testing. Negative anatomy scan  5. Gestational diabetes mellitus (GDM) affecting pregnancy See above  6. Hypertension in pregnancy, essential, antepartum Continue with labetalol 200 bid  Preterm labor symptoms and general obstetric precautions including but not limited to vaginal bleeding, contractions, leaking of fluid and fetal movement were reviewed in detail with the patient. Please refer to After Visit Summary for other counseling recommendations.  Continue with 2x/week testing. RTC 10d   [place] Bingharlie Hubbert Landrigan, MD

## 2016-10-25 NOTE — Progress Notes (Signed)
Pt informed that the ultrasound is considered a limited OB ultrasound and is not intended to be a complete ultrasound exam.  Patient also informed that the ultrasound is not being completed with the intent of assessing for fetal or placental anomalies or any pelvic abnormalities.  Explained that the purpose of today's ultrasound is to assess for presentation and amniotic fluid volume.  Patient acknowledges the purpose of the exam and the limitations of the study.    

## 2016-10-30 ENCOUNTER — Ambulatory Visit (INDEPENDENT_AMBULATORY_CARE_PROVIDER_SITE_OTHER): Payer: Self-pay | Admitting: *Deleted

## 2016-10-30 VITALS — BP 102/65 | HR 79

## 2016-10-30 DIAGNOSIS — O24419 Gestational diabetes mellitus in pregnancy, unspecified control: Secondary | ICD-10-CM

## 2016-10-30 DIAGNOSIS — O10013 Pre-existing essential hypertension complicating pregnancy, third trimester: Secondary | ICD-10-CM

## 2016-10-30 DIAGNOSIS — O10019 Pre-existing essential hypertension complicating pregnancy, unspecified trimester: Secondary | ICD-10-CM

## 2016-10-30 NOTE — Progress Notes (Signed)
Reactive NST 

## 2016-10-31 ENCOUNTER — Other Ambulatory Visit: Payer: Self-pay | Admitting: Obstetrics & Gynecology

## 2016-11-01 ENCOUNTER — Other Ambulatory Visit: Payer: Self-pay | Admitting: Obstetrics & Gynecology

## 2016-11-05 ENCOUNTER — Other Ambulatory Visit: Payer: Self-pay | Admitting: Licensed Clinical Social Worker

## 2016-11-06 ENCOUNTER — Ambulatory Visit: Payer: Self-pay

## 2016-11-06 ENCOUNTER — Ambulatory Visit (HOSPITAL_COMMUNITY)
Admission: RE | Admit: 2016-11-06 | Discharge: 2016-11-06 | Disposition: A | Discharge status: Home / Self Care | Payer: Self-pay | Source: Ambulatory Visit | Attending: Family | Admitting: Family

## 2016-11-06 ENCOUNTER — Ambulatory Visit (INDEPENDENT_AMBULATORY_CARE_PROVIDER_SITE_OTHER): Payer: Self-pay | Admitting: Family

## 2016-11-06 VITALS — BP 132/82 | HR 69

## 2016-11-06 DIAGNOSIS — O288 Other abnormal findings on antenatal screening of mother: Secondary | ICD-10-CM

## 2016-11-06 DIAGNOSIS — O10013 Pre-existing essential hypertension complicating pregnancy, third trimester: Secondary | ICD-10-CM

## 2016-11-06 DIAGNOSIS — Z3A36 36 weeks gestation of pregnancy: Secondary | ICD-10-CM | POA: Insufficient documentation

## 2016-11-06 DIAGNOSIS — O10019 Pre-existing essential hypertension complicating pregnancy, unspecified trimester: Secondary | ICD-10-CM

## 2016-11-06 DIAGNOSIS — O24419 Gestational diabetes mellitus in pregnancy, unspecified control: Secondary | ICD-10-CM

## 2016-11-06 DIAGNOSIS — Z3689 Encounter for other specified antenatal screening: Secondary | ICD-10-CM

## 2016-11-06 DIAGNOSIS — O283 Abnormal ultrasonic finding on antenatal screening of mother: Secondary | ICD-10-CM | POA: Insufficient documentation

## 2016-11-08 ENCOUNTER — Encounter (HOSPITAL_COMMUNITY): Payer: Self-pay

## 2016-11-08 ENCOUNTER — Inpatient Hospital Stay (HOSPITAL_COMMUNITY)
Admission: AD | Admit: 2016-11-08 | Discharge: 2016-11-08 | Disposition: A | Discharge status: Home / Self Care | Payer: Self-pay | Source: Ambulatory Visit | Attending: Obstetrics & Gynecology | Admitting: Obstetrics & Gynecology

## 2016-11-08 ENCOUNTER — Ambulatory Visit (INDEPENDENT_AMBULATORY_CARE_PROVIDER_SITE_OTHER): Payer: Self-pay | Admitting: Student

## 2016-11-08 VITALS — BP 122/83 | HR 67 | Wt 171.9 lb

## 2016-11-08 DIAGNOSIS — Z79899 Other long term (current) drug therapy: Secondary | ICD-10-CM | POA: Insufficient documentation

## 2016-11-08 DIAGNOSIS — O09529 Supervision of elderly multigravida, unspecified trimester: Secondary | ICD-10-CM

## 2016-11-08 DIAGNOSIS — O10013 Pre-existing essential hypertension complicating pregnancy, third trimester: Secondary | ICD-10-CM

## 2016-11-08 DIAGNOSIS — O368390 Maternal care for abnormalities of the fetal heart rate or rhythm, unspecified trimester, not applicable or unspecified: Secondary | ICD-10-CM

## 2016-11-08 DIAGNOSIS — O24419 Gestational diabetes mellitus in pregnancy, unspecified control: Secondary | ICD-10-CM

## 2016-11-08 DIAGNOSIS — Z603 Acculturation difficulty: Secondary | ICD-10-CM

## 2016-11-08 DIAGNOSIS — Z113 Encounter for screening for infections with a predominantly sexual mode of transmission: Secondary | ICD-10-CM

## 2016-11-08 DIAGNOSIS — O0993 Supervision of high risk pregnancy, unspecified, third trimester: Secondary | ICD-10-CM

## 2016-11-08 DIAGNOSIS — Z3A36 36 weeks gestation of pregnancy: Secondary | ICD-10-CM

## 2016-11-08 DIAGNOSIS — O10019 Pre-existing essential hypertension complicating pregnancy, unspecified trimester: Secondary | ICD-10-CM

## 2016-11-08 DIAGNOSIS — Z789 Other specified health status: Secondary | ICD-10-CM | POA: Insufficient documentation

## 2016-11-08 DIAGNOSIS — O280 Abnormal hematological finding on antenatal screening of mother: Secondary | ICD-10-CM

## 2016-11-08 LAB — POCT URINALYSIS DIP (DEVICE)
BILIRUBIN URINE: NEGATIVE
Glucose, UA: NEGATIVE mg/dL
HGB URINE DIPSTICK: NEGATIVE
Ketones, ur: NEGATIVE mg/dL
Nitrite: NEGATIVE
PH: 5.5 (ref 5.0–8.0)
Protein, ur: NEGATIVE mg/dL
Urobilinogen, UA: 0.2 mg/dL (ref 0.0–1.0)

## 2016-11-08 LAB — POCT PREGNANCY, URINE: Preg Test, Ur: NEGATIVE

## 2016-11-08 LAB — OB RESULTS CONSOLE GBS: GBS: NEGATIVE

## 2016-11-08 MED ORDER — GLYBURIDE 5 MG PO TABS: 5.0000 mg | ORAL_TABLET | Freq: Every day | ORAL | 0 refills | Status: DC

## 2016-11-08 MED ORDER — GLYBURIDE 5 MG PO TABS: 10.0000 mg | ORAL_TABLET | Freq: Every day | ORAL | 0 refills | Status: DC

## 2016-11-08 NOTE — Progress Notes (Signed)
    PRENATAL VISIT NOTE  Subjective:  [name] Perry is a 41 y.o. [id number] at 766w4d being seen today for ongoing prenatal care.  She is currently monitored for the following issues for this high-risk pregnancy and has AMA (advanced maternal age) multigravida 35+; Hypertension in pregnancy, essential, antepartum; Antepartum multigravida of advanced maternal age; Supervision of high-risk pregnancy; History of gestational diabetes in prior pregnancy, currently pregnant in second trimester; Abnormal quad screen; and Gestational diabetes mellitus (GDM) affecting pregnancy on her problem list.  Patient reports no complaints.  Contractions: Irregular. Vag. Bleeding: None.  Movement: Present. Denies leaking of fluid. Denies headache, vision changes, or epigastric pain.   The following portions of the patient's history were reviewed and updated as appropriate: allergies, current medications, past family history, past medical history, past social history, past surgical history and problem list. Problem list updated.  Objective:   Vitals:   11/08/16 1420  BP: 122/83  Pulse: 67  Weight: 171 lb 14.4 oz (78 kg)    Fetal Status: Fetal Heart Rate (bpm): NST   Movement: Present     General:  Alert, oriented and cooperative. Patient is in no acute distress.  Skin: Skin is warm and dry. No rash noted.   Cardiovascular: Normal heart rate noted  Respiratory: Normal respiratory effort, no problems with respiration noted  Abdomen: Soft, gravid, appropriate for gestational age. Pain/Pressure: Present     Pelvic:  Cervical exam deferred        Extremities: Normal range of motion.  Edema: None  Mental Status: Normal mood and affect. Normal behavior. Normal judgment and thought content.   Assessment and Plan:  Pregnancy: [id number] at 7466w4d  1. Supervision of high risk pregnancy in third trimester  - Culture, beta strep (group b only) - GC/Chlamydia probe amp ([place]  2. Gestational  diabetes mellitus (GDM) affecting pregnancy -  Fasting PPB PPL PPD  98-130, 8/8 abnl 65-195, 1/8 abnl 75-200, 4/8 abnl 100-198, 2/5 abnl  - Reviewed BS log with Dr. Tricia Pledger FullingHarraway-Smith -- increased glyburide to 5 mg every AM & 10 mg every PM - Fetal nonstress test  3. Hypertension in pregnancy, essential, antepartum -continues labetalol -normotensive & asymptomatic  4. Variable deceleration - sent to MAU for monitoring  5. Language Barrier -Spanish video interpreter  Term labor symptoms and general obstetric precautions including but not limited to vaginal bleeding, contractions, leaking of fluid and fetal movement were reviewed in detail with the patient. Please refer to After Visit Summary for other counseling recommendations.  Return in about 5 days (around 11/13/2016) for as scheduled.   Judeth HornErin Eloy Fehl, NP

## 2016-11-08 NOTE — Progress Notes (Signed)
US for growth scheduled on 2/20

## 2016-11-08 NOTE — Discharge Instructions (Signed)

## 2016-11-08 NOTE — Patient Instructions (Signed)
Diabetes mellitus gestacional, cuidados personales (Gestational Diabetes Mellitus, Self Care) El cuidado personal despus del diagnstico de diabetes gestacional (diabetes mellitus gestacional) implica mantener el nivel de glucosa en la sangre bajo control a travs del equilibrio de los siguientes factores:  Nutricin.  Actividad fsica.  Cambios en el estilo de vida.  Medicamentos o insulina, si es necesario.  El apoyo del equipo de mdicos y de Producer, television/film/video. La siguiente informacin explica lo que debe saber para mantener la diabetes gestacional bajo control en su casa. QU DEBO SABER PARA MANTENER LA GLUCEMIA BAJO CONTROL?  [place]. Haga esto con la frecuencia que le haya indicado el mdico.  Comunquese con el mdico si la glucemia est por encima del nivel ideal en 2anlisis seguidos. El [employer] objetivos personalizados de su tratamiento. Generalmente, el objetivo del tratamiento es [employer] siguientes niveles de glucemia durante el embarazo:  Despus de no haber comido durante 8horas (despus de ayunar): igual o menor que 40m/dl (5,372ml/l).  Despus de las comidas (posprandial):  Una hora despus de una comida: igual o menor que 14067ml (7,8mm51ml).  Dos horas despus de una comida: igual o menor que 120mg20m(6,7mmol57m.  Nivel de A1c (hemoglobinaA1c): del 6% al 6,5%. QU DEBO SABER SOBRE LA HIPERGBarrys la hiperglucemia?  La hiperglucemia, tambin llamada glucemia alta, ocurre cuando el nivel de glucosa en la sangre es muy elNewcomerstownrese de conoceRyland Groups tempranos de hiperglucemia, por ejemplo:  Aumento de la sed.  Hambre.  Mucho cansancio.  Necesidad de orinarGarment/textile technologistayor frecuencia que lo habitual.  Visin borrosa. Qu es la hipoglucemia?  La hipoglucemia, tambin llamada glucemia baja, ocurre cuando el nivel de glucosa en la sangre es igual  o menor que 70mg/d41m,9mmol/l61mEl riesgo de hipoglucemia aumenta durante o despus de realizarOptometristad fsica, mientras duerme, cuando est enfermo o si se saltea comidas o no come durante [employer]. Es importanPhotographeromas de la hipoglucemia y tratarla de inmediato. Lleve siempre consigo una colacin de 15gramos hidratos de carbono de accin rpida para tratar la glucemia baja.Los familiares y los amigos cercanos tambin deben conocer [employer], y comprender cmo tratar la hipoglucemia, en caso de que usted no pueda tratarse a s mismo. Cules son los sntomas de la hipoglucemia?  Los sntomas de hipoglucemia pueden incluir los siguientes:  Hambre. [place].  Sudoracin y piel hmeda.  Confusin.  Mareos o sensacin de desvanecimiento.  Somnolencia.  Nuseas.  Aumento de la frecuencia cardaca.  Dolor de cabeza. [place] borrosa.  Convulsiones.  Pesadillas.  Hormigueo o adormecimiento alrededor de la boExxon Mobil Corporationbios o la lengua. [place] en el habla.  Disminucin de la capacidad de concentracin.  Cambios en la coordinacin.  Sueo agitado.  Temblores o sacudidas.  Desmayos.  Irritabilidad. Cmo se trata la hipoglucemia?  Si est alerta y puede tragar con seguridad, siga la regla de 15/15, que consiste en lo siguiente:  Tome 15gramos de hidratos de carbono de accin rpida. Las opciones de accin rpida incluyen lo siguiente:  1pomo de glucosa en gel.  3comprimidos de glucosa.  6 a 8unidades de caramelos duros.  4onzas (120ml) de69mo de frutas.  4onzas (120ml) de 80mosa comn (no diettica).  Contrlese la glucemia 15minutos 20mus de ingerir el hidrato de carbono.  Si este nuevo nivel de glucosa en la sangre an es de 70mg/dl o m33m (3,9mmGarment/textile technologist, vu104ma a ingerir 15gramos de  un hidrato de carbono.  Si la glucemia no aumenta por encima de 70mg/dl (3,9mmol/l) despus de 3intentos, solicite ayuda mdica  de emergencia.  Ingiera una comida o una colacin en el transcurso de 1hora despus de que la glucemia se haya normalizado. Cmo se trata la hipoglucemia grave?  La hipoglucemia grave ocurre cuando la glucemia es igual o menor que 54mg/dl (3mmol/l). La hipoglucemia grave es una emergencia. No espere hasta que los sntomas desaparezcan. Solicite atencin mdica de inmediato. Comunquese con el servicio de emergencias de su localidad (911 en los [place]). No conduzca por sus propios medios hasta el hospital. Si tiene hipoglucemia grave y no puede ingerir alimentos o bebidas, tal vez deba aplicarse una inyeccin de glucagn. Un familiar o un amigo cercano deben aprender a controlarle la glucemia y a aplicarle una inyeccin de glucagn. Pregntele al mdico si debe tener disponible un kit de inyecciones de glucagn de emergencia. Es posible que la hipoglucemia grave deba tratarse en un hospital. El tratamiento puede incluir la administracin de glucosa a travs de una va intravenosa (IV). Tambin puede necesitar un tratamiento para tratar la afeccin que est causando la hipoglucemia. QU OTRAS COSAS PUEDO HACER PARA CONTROLAR LA DIABETES GESTACIONAL? Tome los medicamentos para la diabetes como se lo hayan indicado   Si el mdico le recet insulina o medicamentos para la diabetes, tmelos todos los das.  No se quede sin insulina ni cualquier otro medicamento para la diabetes que tome. Planifique con antelacin para tenerlos siempre a su disposicin.  Si usa insulina, ajuste las dosis en funcin de la cantidad de actividad fsica que realiza y de los alimentos que consume. El mdico le indicar cmo ajustar las dosis. Opte por opciones de alimentos saludables  Lo que come y bebe incide en la glucemia. Hacer buenas elecciones ayuda a mantener la diabetes bajo control y a evitar otros problemas de salud. Un plan de alimentacin saludable incluye consumir protenas magras, hidratos de carbono  complejos, frutas y verduras frescas, productos lcteos con bajo contenido de grasa y grasas saludables. Programe una cita con un especialista en alimentacin y nutricin (nutricionista certificado) para que la ayude a armar un plan de alimentacin adecuado para usted. Asegrese de lo siguiente:  Siga las indicaciones del mdico respecto de las restricciones para las comidas o las bebidas.  Beba suficiente lquido para mantener la orina clara o de color amarillo plido.  Ingiera colaciones saludables entre comidas nutritivas.  Haga un seguimiento de los hidratos de carbono que consume. Para hacerlo, lea las etiquetas de informacin nutricional y aprenda cules son los tamaos de las porciones estndar de los alimentos.  Siga el plan para los das de enfermedad cuando no pueda comer o beber normalmente. Arme este plan por adelantado con el mdico. Mantngase activa   Haga al menos 30minutos de actividad fsica por da, o tanta actividad fsica como le recomiende el mdico durante el embarazo.  Hacer 10minutos de actividad fsica 30minutos despus de cada comida puede ayudar a controlar los niveles de glucemia posprandial.  Si comienza un ejercicio o una actividad nuevos, trabaje con el mdico para ajustar la insulina, los medicamentos o la ingesta de comidas segn sea necesario. Opte por un estilo de vida saludable   No beba alcohol.  No consuma ningn producto que contenga tabaco, lo que incluye cigarrillos, tabaco de mascar y cigarrillos electrnicos. Si necesita ayuda para dejar de fumar, consulte al mdico.  Aprenda a manejar el estrs. Si necesita ayuda para lograrlo, consulte a   su mdico. Cuide su cuerpo   Mantngase al da con las vacunas.  Cepllese los dientes y [place] y use hilo dental al menos una vez por da.  Visite al dentista al menos una vez cada 76mses.  Mantenga un peso sTax adviser Instrucciones generales   TAnheuser-Buschde venta libre y los recetados solamente como se lo haya indicado el mdico.  Hable con el mdico sobre el riesgo de tener hipertensin arterial durante el embarazo (preeclampsia o eclampsia).  Comparta su plan de control de la diabetes con sus compaeros de trabajo y de lCytogeneticist y con las personas con las que cCanoochee  Controle el nivel de cetonas en la orina durante el embarazo cuando est enferma o como se lo haya indicado el mdico.  Lleve una tarjeta de alerta mdica o use un brazalete o medalla de alerta mdica.  Pregntele al mdico:  Debo reunirme con uRadio broadcast assistantpara el cuidado de la diabetes?  Dnde puedo encontrar un grupo de apoyo para personas diabticas?  Asista a todas las visitas de control durante el embarazo (prenatal) y despus del parto (posnatal) como se lo haya indicado el mdico. Esto es importante. Procure recibir la atencin que necesita despus del parto   Hgase controlar la glucemia de 4a 12semanas despus del parto. Esto se hace con una prueba de tolerancia a la glucosa oral (PTGO).  Hgase controles de deteccin de la diabetes al mWalgreencada 3aos o con la frecuencia que le haya indicado el mdico. DNDE EFortune BrandsMS INFORMACIN: Para obtener ms informacin sobre la diabetes gestacional, visite los siguientes sitios:  Asociacin Americana de la Diabetes (American Diabetes Association, ADA): www.diabetes.org/diabetes-basics/gestational  Centros para eBuilding surveyory la Prevencin de EProbation officerfor Disease Control and Prevention, CDC): whttp://[email or web address]-watson.[email or web address]/pdf Esta informacin no tiene cMarine scientistel consejo del mdico. Asegrese de hacerle al mdico cualquier pregunta que tenga. Document Released: 01/02/2016 Document Revised: 01/02/2016 Document Reviewed: 10/14/2015 Elsevier Interactive Patient Education  2017 EReynolds [employer]

## 2016-11-08 NOTE — MAU Note (Signed)
Urine sent to lab 

## 2016-11-08 NOTE — MAU Provider Note (Signed)
Chief Complaint:  Non-stress Test   Seen first at 1540hrs   HPI: [name] Perry is a 41 y.o. [id number] at 3236w4dwho presents to maternity admissions reporting nonreactive NST in clinic.  Getting monitored for GDM and AMA. . She reports good fetal movement, denies LOF, vaginal bleeding, vaginal itching/burning, urinary symptoms, h/a, dizziness, n/v, diarrhea, constipation or fever/chills.  She denies headache, visual changes or RUQ abdominal pain.   Other  This is a new problem. The current episode started today. The problem occurs constantly. The problem has been unchanged. Pertinent negatives include no abdominal pain, chest pain, chills, fever, headaches, myalgias or vomiting. Nothing aggravates the symptoms. She has tried nothing for the symptoms.   RN Note: Sent from clinic for NR NST  Past Medical History: Past Medical History:  Diagnosis Date  . Anxiety   . Ectopic pregnancy march  . Gestational diabetes    glyburide  . Heart palpitations    anxiety related  . Hypertension 2010    Past obstetric history: OB History  Gravida Para Term Preterm AB Living  6 3 3   2 3   SAB TAB Ectopic Multiple Live Births  1   1 0 3    # Outcome Date GA Lbr Len/2nd Weight Sex Delivery Anes PTL Lv  6 Current           5 Term 02/04/15 9653w0d 02:05 / 00:07 9 lb 13.7 oz (4.471 kg) F Vag-Spont None  LIV     Complications: GDM (gestational diabetes mellitus)  4 Ectopic 12/03/13          3 Term 2009 5767w0d  7 lb 7 oz (3.374 kg) M Vag-Spont   LIV  2 Term 2007 2167w0d  8 lb 3 oz (3.714 kg) M Vag-Spont   LIV  1 SAB 2005              Past Surgical History: Past Surgical History:  Procedure Laterality Date  . INSERTION OF MESH N/A 01/11/2014   Procedure: INSERTION OF MESH;  Surgeon: Axel FillerArmando Ramirez, MD;  Location: [facility] OR;  Service: General;  Laterality: N/A;  . UMBILICAL HERNIA REPAIR N/A 01/11/2014   Procedure: LAPAROSCOPIC UMBILICAL HERNIA;  Surgeon: Axel FillerArmando Ramirez, MD;  Location: [facility] OR;   Service: General;  Laterality: N/A;    Family History: Family History  Problem Relation Age of Onset  . Cirrhosis Mother     Not alcoholic--not clear of etiology  . Ulcers Mother   . Alcohol abuse Father     Social History: Social History  Substance Use Topics  . Smoking status: Never Smoker  . Smokeless tobacco: Never Used  . Alcohol use No    Allergies: No Known Allergies  Meds:  Prescriptions Prior to Admission  Medication Sig Dispense Refill Last Dose  . Calcium 200 MG TABS Take 1 tablet by mouth daily.    11/08/2016 at Unknown time  . folic acid (FOLVITE) 400 MCG tablet Take 400 mcg by mouth daily.   11/08/2016 at Unknown time  . glyBURIDE (DIABETA) 5 MG tablet Take 2 tablets (10 mg total) by mouth at bedtime. 60 tablet 0 11/07/2016 at Unknown time  . labetalol (NORMODYNE) 100 MG tablet 1/2 tab by mouth twice daily (Patient taking differently: Take 100 mg by mouth 2 (two) times daily. ) 60 tablet 11 11/08/2016 at 0800  . Prenatal Vit-Fe Fumarate-FA (PRENATAL VITAMIN PO) Take 1 tablet by mouth daily.    11/08/2016 at Unknown time  . glyBURIDE (DIABETA) 5 MG tablet Take  1 tablet (5 mg total) by mouth daily with breakfast. (Patient not taking: Reported on 11/08/2016) 30 tablet 0 Not Taking at Unknown time    I have reviewed patient's Past Medical Hx, Surgical Hx, Family Hx, Social Hx, medications and allergies.   ROS:  Review of Systems  Constitutional: Negative for chills and fever.  Cardiovascular: Negative for chest pain.  Gastrointestinal: Negative for abdominal pain and vomiting.  Musculoskeletal: Negative for myalgias.  Neurological: Negative for headaches.   Other systems negative  Physical Exam  Patient Vitals for the past 24 hrs:  BP Temp Temp src Pulse Resp  11/08/16 1726 133/84 - - - 16  11/08/16 1533 115/77 - - 68 -  11/08/16 1532 - 97.9 F (36.6 C) Oral - -   Constitutional: Well-developed, well-nourished female in no acute distress.  Cardiovascular:  normal rate and rhythm Respiratory: normal effort, clear to auscultation bilaterally GI: Abd soft, non-tender, gravid appropriate for gestational age.   No rebound or guarding. MS: Extremities nontender, no edema, normal ROM Neurologic: Alert and oriented x 4.  GU: Neg CVAT.  PELVIC EXAM:   Dilation: 2 Effacement (%): 60 Station: -3 Presentation: Vertex Exam by:: Artelia Laroche CNM  FHT:  Baseline 125 , moderate variability, accelerations present, no decelerations Contractions:  Irregular  Monitored for over 2 hours    Labs: Results for orders placed or performed in visit on 11/08/16 (from the past 24 hour(s))  Pregnancy, urine POC     Status: None   Collection Time: 11/08/16  2:06 PM  Result Value Ref Range   Preg Test, Ur NEGATIVE NEGATIVE  POCT urinalysis dip (device)     Status: Abnormal   Collection Time: 11/08/16  3:06 PM  Result Value Ref Range   Glucose, UA NEGATIVE NEGATIVE mg/dL   Bilirubin Urine NEGATIVE NEGATIVE   Ketones, ur NEGATIVE NEGATIVE mg/dL   Specific Gravity, Urine <=1.005 1.005 - 1.030   Hgb urine dipstick NEGATIVE NEGATIVE   pH 5.5 5.0 - 8.0   Protein, ur NEGATIVE NEGATIVE mg/dL   Urobilinogen, UA 0.2 0.0 - 1.0 mg/dL   Nitrite NEGATIVE NEGATIVE   Leukocytes, UA LARGE (A) NEGATIVE   O/Positive/-- (10/16 0000)  Imaging:  US Ob Limited  Result Date: 11/08/2016 AFI normal  [place] Mfm Fetal Bpp Wo Non Stress  Result Date: 11/06/2016 ----------------------------------------------------------------------  OBSTETRICS REPORT                      (Signed Final 11/06/2016 03:28 pm) ---------------------------------------------------------------------- Patient Info  ID #:       [id number]                          D.O.B.:  1976/01/09 (40 yrs)  Name:       [name] Perry            Visit Date: 11/06/2016 12:57 pm ---------------------------------------------------------------------- Performed By  Performed By:     Tommi Emery         Ref. Address:     [facility]  [facility]  Attending:        Charlsie Merles MD         Location:         [facility]  Referred By:      Felipe Drone ---------------------------------------------------------------------- Orders   #  Description                                 Code   1  [place] MFM FETAL BPP WO NON STRESS              76819.01  ----------------------------------------------------------------------   #  Ordered By               Order #        Accession #    Episode #   1  Rochele Pages            [id number]      [id number]     [id number]  ---------------------------------------------------------------------- Indications   [redacted] weeks gestation of pregnancy                Z3A.36   Non-reactive NST                               O28.9  ---------------------------------------------------------------------- OB History  Gravidity:    6         Term:   3        Prem:   0        SAB:   1  TOP:          0       Ectopic:  1        Living: 3 ---------------------------------------------------------------------- Fetal Evaluation  Num Of Fetuses:     1  Fetal Heart         143  Rate(bpm):  Cardiac Activity:   Observed  Presentation:       Cephalic  Placenta:           Posterior, above cervical os  Amniotic Fluid  AFI FV:      Subjectively within normal limits  AFI Sum(cm)     %Tile       Largest Pocket(cm)  15.02           55          6.87  RUQ(cm)       RLQ(cm)       LUQ(cm)  6.87          4.52          3.63 ---------------------------------------------------------------------- Biophysical Evaluation  Amniotic F.V:   Within normal limits       F. Tone:        Observed  F. Movement:    Observed                   Score:          8/8  F. Breathing:   Observed ---------------------------------------------------------------------- Gestational Age  LMP:           37w 5d        Date:  02/16/16                 EDD:   11/22/16  Best:  36w 2d      Det. By:  U/S  (07/16/16)          EDD:   12/02/16 ---------------------------------------------------------------------- Impression  IUP at 36+2 weeks, with nonreactive NST.  Normal amniotic fluid  BPP 8/8 ---------------------------------------------------------------------- Recommendations  Follow-up ultrasounds as clinically indicated. ----------------------------------------------------------------------                 Charlsie Merles, MD Electronically Signed Final Report   11/06/2016 03:28 pm ----------------------------------------------------------------------  [place] Mfm Ob Follow Up  Result Date: 10/17/2016 ----------------------------------------------------------------------  OBSTETRICS REPORT                       (Signed Final 10/17/2016 12:04 am) ---------------------------------------------------------------------- Patient Info  ID #:       [id number]                          D.O.B.:  [birth year] (40 yrs)  Name:       [name] Perry            Visit Date: 10/16/2016 10:35 am ---------------------------------------------------------------------- Performed By  Performed By:     Eden Lathe BS      Ref. Address:      [facility]                    RDMS RVT                                                              Health  Attending:        Particia Nearing MD       Location:          [facility]  Referred By:      Felipe Drone ---------------------------------------------------------------------- Orders   #  Description                                 Code   1  [place] MFM OB FOLLOW UP                         40981.19  ----------------------------------------------------------------------   #  Ordered By               Order #        Accession #    Episode #   1  Particia Nearing            [id number]      [id number]     [id number]  ---------------------------------------------------------------------- Indications   [redacted] weeks gestation of pregnancy                Z3A.39   Advanced  maternal age multigravida (75),       O09.523   third trimester -declined further testing   Abnormal biochemical screen (quad) for         O28.9   Trisomy 18 (1:42)   Poor obstetric history: Previous gestational   O09.299   diabetes   Hypertension - Chronic/Pre-existing -labetalol O10.019   Gestational diabetes in pregnancy,  O24.415   controlled by oral hypoglycemic drugs   (glyburide)  ---------------------------------------------------------------------- OB History  Gravidity:    6         Term:   3        Prem:   0         SAB:   1  TOP:          0       Ectopic:  1        Living: 3 ---------------------------------------------------------------------- Fetal Evaluation  Num Of Fetuses:     1  Fetal Heart         150  Rate(bpm):  Cardiac Activity:   Observed  Presentation:       Cephalic  Placenta:           Posterior, above cervical os  P. Cord Insertion:  Visualized, central  Amniotic Fluid  AFI FV:      Subjectively within normal limits  AFI Sum(cm)     %Tile       Largest Pocket(cm)  17.74           65          7.07  RUQ(cm)       RLQ(cm)       LUQ(cm)        LLQ(cm)  4.31          5.12          7.07           1.24 ---------------------------------------------------------------------- Biometry  BPD:      80.7  mm     G. Age:  32w 3d         20  %    CI:          75.1  %    70 - 86                                                          FL/HC:       20.6  %    19.9 - 21.5  HC:      295.4  mm     G. Age:  32w 4d          7  %    HC/AC:       0.89       0.96 - 1.11  AC:      330.9  mm     G. Age:  37w 0d       > 97  %    FL/BPD:      75.6  %    71 - 87  FL:         61  mm     G. Age:  31w 5d          8  %    FL/AC:       18.4  %    20 - 24  HUM:        56  mm     G. Age:  32w 4d         44  %  Est. FW:    2494   gm     5 lb 8 oz     81  % ---------------------------------------------------------------------- Gestational Age  LMP:           34w 5d        Date:  02/16/16                 EDD:     11/22/16  U/S Today:     33w 3d                                        EDD:    12/01/16  Best:          33w 2d     Det. By:  U/S  (07/16/16)          EDD:    12/02/16 ---------------------------------------------------------------------- Anatomy  Cranium:               Appears normal         Aortic Arch:            Previously seen  Cavum:                 Appears normal         Ductal Arch:            Previously seen  Ventricles:            Appears normal         Diaphragm:              Previously seen  Choroid Plexus:        Appears normal         Stomach:                Appears normal, left                                                                        sided  Cerebellum:            Appears normal         Abdomen:                Appears normal  Posterior Fossa:       Previously seen        Abdominal Wall:         Previously seen  Nuchal Fold:           Not applicable (>20    Cord Vessels:           Previously seen                         wks GA)  Face:                  Appears normal         Kidneys:                Appear normal                         (orbits and profile)  Lips:                  Appears normal  Bladder:                Appears normal  Thoracic:              Appears normal         Spine:                  Previously seen  Heart:                 Appears normal         Upper Extremities:      Previously seen                         (4CH, axis, and situs  RVOT:                  Appears normal         Lower Extremities:      Previously seen  LVOT:                  Appears normal  Other:  Fetus appears to be a female. Heels and 5th digit previously          visualized. ---------------------------------------------------------------------- Cervix Uterus Adnexa  Cervix  Not visualized (advanced GA >29wks)  Uterus  No abnormality visualized.  Left Ovary  Not visualized.  Right Ovary  Simple cyst, measuring 4.1 x 2.5 x 2.5 cm.  Cul De Sac:   No free fluid seen.  Adnexa:       No abnormality  visualized. ---------------------------------------------------------------------- Impression  SIUP at 33+2 weeks  Normal interval anatomy; anatomic survey complete  Normal amniotic fluid volume  Appropriate interval growth with EFW at the 81st %tile; AC >  97th %tile ---------------------------------------------------------------------- Recommendations  Follow-up as clinically indicated ----------------------------------------------------------------------                Particia Nearing, MD Electronically Signed Final Report   10/17/2016 12:04 am ----------------------------------------------------------------------   MAU Course/MDM: I have ordered labs and reviewed results.  NST reviewed  Reactive, but since the beginning was not perfectly reactive, I ordered a BPP and AFI which were both normal FHR was reactive afterward   Treatments in MAU included NST.    Assessment: 1. Language barrier   2. Gestational diabetes mellitus (GDM) affecting pregnancy   3. Abnormal quad screen   4. Supervision of high risk pregnancy in third trimester   5. Antepartum multigravida of advanced maternal age     Plan: Discharge home Labor precautions and fetal kick counts Follow up in Office for prenatal visits and recheck of NST  Encouraged to return here or to other Urgent Care/ED if she develops worsening of symptoms, increase in pain, fever, or other concerning symptoms.   Pt stable at time of discharge.  Wynelle Bourgeois CNM, MSN Certified Nurse-Midwife 11/08/2016 5:39 PM

## 2016-11-08 NOTE — MAU Note (Signed)
Sent from clinic for NR NST

## 2016-11-09 LAB — GC/CHLAMYDIA PROBE AMP (~~LOC~~) NOT AT ARMC
Chlamydia: NEGATIVE
Neisseria Gonorrhea: NEGATIVE

## 2016-11-09 NOTE — Progress Notes (Signed)
Pt NST not reactive; one 15x15 accel; sent to US for BPP > 8/8, nml AFI

## 2016-11-11 LAB — CULTURE, BETA STREP (GROUP B ONLY)

## 2016-11-12 ENCOUNTER — Telehealth: Payer: Self-pay | Admitting: *Deleted

## 2016-11-12 NOTE — Telephone Encounter (Signed)
Received message left on nurse line on 11/09/16 at 0946 [facility] with [facility].  States she has 2 prescriptions for this patient with different directions.  Needs clarification.  Requests a return call to [phone].

## 2016-11-12 NOTE — Telephone Encounter (Signed)
Called [facility] and clarified dose of Glyburide is 2 tablets @ bedtime daily.

## 2016-11-13 ENCOUNTER — Ambulatory Visit (INDEPENDENT_AMBULATORY_CARE_PROVIDER_SITE_OTHER): Payer: Self-pay | Admitting: Student

## 2016-11-13 ENCOUNTER — Other Ambulatory Visit (HOSPITAL_COMMUNITY): Payer: Self-pay | Admitting: Maternal and Fetal Medicine

## 2016-11-13 ENCOUNTER — Encounter (HOSPITAL_COMMUNITY): Payer: Self-pay

## 2016-11-13 ENCOUNTER — Other Ambulatory Visit: Payer: Self-pay | Admitting: Student

## 2016-11-13 ENCOUNTER — Ambulatory Visit (HOSPITAL_COMMUNITY)
Admission: RE | Admit: 2016-11-13 | Discharge: 2016-11-13 | Disposition: A | Discharge status: Home / Self Care | Payer: Self-pay | Source: Ambulatory Visit | Attending: Nurse Practitioner | Admitting: Nurse Practitioner

## 2016-11-13 VITALS — BP 130/80 | HR 69

## 2016-11-13 DIAGNOSIS — Z3A37 37 weeks gestation of pregnancy: Secondary | ICD-10-CM | POA: Insufficient documentation

## 2016-11-13 DIAGNOSIS — O09293 Supervision of pregnancy with other poor reproductive or obstetric history, third trimester: Secondary | ICD-10-CM | POA: Insufficient documentation

## 2016-11-13 DIAGNOSIS — O10019 Pre-existing essential hypertension complicating pregnancy, unspecified trimester: Secondary | ICD-10-CM

## 2016-11-13 DIAGNOSIS — Z789 Other specified health status: Secondary | ICD-10-CM

## 2016-11-13 DIAGNOSIS — O24419 Gestational diabetes mellitus in pregnancy, unspecified control: Secondary | ICD-10-CM

## 2016-11-13 DIAGNOSIS — O280 Abnormal hematological finding on antenatal screening of mother: Secondary | ICD-10-CM

## 2016-11-13 DIAGNOSIS — O24415 Gestational diabetes mellitus in pregnancy, controlled by oral hypoglycemic drugs: Secondary | ICD-10-CM | POA: Insufficient documentation

## 2016-11-13 DIAGNOSIS — O288 Other abnormal findings on antenatal screening of mother: Secondary | ICD-10-CM

## 2016-11-13 DIAGNOSIS — O09513 Supervision of elderly primigravida, third trimester: Secondary | ICD-10-CM

## 2016-11-13 DIAGNOSIS — O0993 Supervision of high risk pregnancy, unspecified, third trimester: Secondary | ICD-10-CM

## 2016-11-13 DIAGNOSIS — O163 Unspecified maternal hypertension, third trimester: Secondary | ICD-10-CM

## 2016-11-13 DIAGNOSIS — O283 Abnormal ultrasonic finding on antenatal screening of mother: Secondary | ICD-10-CM | POA: Insufficient documentation

## 2016-11-13 DIAGNOSIS — O09523 Supervision of elderly multigravida, third trimester: Secondary | ICD-10-CM | POA: Insufficient documentation

## 2016-11-13 DIAGNOSIS — O281 Abnormal biochemical finding on antenatal screening of mother: Secondary | ICD-10-CM

## 2016-11-13 DIAGNOSIS — O10013 Pre-existing essential hypertension complicating pregnancy, third trimester: Secondary | ICD-10-CM | POA: Insufficient documentation

## 2016-11-13 DIAGNOSIS — Z3689 Encounter for other specified antenatal screening: Secondary | ICD-10-CM

## 2016-11-13 DIAGNOSIS — O09529 Supervision of elderly multigravida, unspecified trimester: Secondary | ICD-10-CM

## 2016-11-13 NOTE — Progress Notes (Signed)
US for growth scheduled today - BPP added due to NR NST.

## 2016-11-13 NOTE — Progress Notes (Signed)
Patient NST non-reactive today. Scheduled for growth US at 10:30 and BPP

## 2016-11-20 ENCOUNTER — Inpatient Hospital Stay (HOSPITAL_COMMUNITY): Payer: Medicaid Other

## 2016-11-20 ENCOUNTER — Inpatient Hospital Stay (HOSPITAL_COMMUNITY)
Admission: AD | Admit: 2016-11-20 | Discharge: 2016-11-23 | DRG: 774 | Disposition: A | Discharge status: Home / Self Care | Payer: Medicaid Other | Source: Ambulatory Visit | Attending: Obstetrics & Gynecology | Admitting: Obstetrics & Gynecology

## 2016-11-20 ENCOUNTER — Ambulatory Visit: Payer: Self-pay

## 2016-11-20 ENCOUNTER — Encounter (HOSPITAL_COMMUNITY): Payer: Self-pay | Admitting: *Deleted

## 2016-11-20 ENCOUNTER — Ambulatory Visit (INDEPENDENT_AMBULATORY_CARE_PROVIDER_SITE_OTHER): Payer: Self-pay | Admitting: Medical

## 2016-11-20 VITALS — BP 115/77 | HR 88 | Wt 170.6 lb

## 2016-11-20 DIAGNOSIS — O9912 Other diseases of the blood and blood-forming organs and certain disorders involving the immune mechanism complicating childbirth: Secondary | ICD-10-CM | POA: Diagnosis present

## 2016-11-20 DIAGNOSIS — D696 Thrombocytopenia, unspecified: Secondary | ICD-10-CM | POA: Diagnosis present

## 2016-11-20 DIAGNOSIS — Z3A38 38 weeks gestation of pregnancy: Secondary | ICD-10-CM | POA: Diagnosis not present

## 2016-11-20 DIAGNOSIS — O0993 Supervision of high risk pregnancy, unspecified, third trimester: Secondary | ICD-10-CM

## 2016-11-20 DIAGNOSIS — Z789 Other specified health status: Secondary | ICD-10-CM

## 2016-11-20 DIAGNOSIS — O9952 Diseases of the respiratory system complicating childbirth: Secondary | ICD-10-CM | POA: Diagnosis present

## 2016-11-20 DIAGNOSIS — O24419 Gestational diabetes mellitus in pregnancy, unspecified control: Secondary | ICD-10-CM

## 2016-11-20 DIAGNOSIS — J181 Lobar pneumonia, unspecified organism: Secondary | ICD-10-CM

## 2016-11-20 DIAGNOSIS — O09529 Supervision of elderly multigravida, unspecified trimester: Secondary | ICD-10-CM

## 2016-11-20 DIAGNOSIS — O1002 Pre-existing essential hypertension complicating childbirth: Secondary | ICD-10-CM | POA: Diagnosis present

## 2016-11-20 DIAGNOSIS — O24425 Gestational diabetes mellitus in childbirth, controlled by oral hypoglycemic drugs: Secondary | ICD-10-CM | POA: Diagnosis present

## 2016-11-20 DIAGNOSIS — O1092 Unspecified pre-existing hypertension complicating childbirth: Secondary | ICD-10-CM | POA: Diagnosis not present

## 2016-11-20 DIAGNOSIS — O288 Other abnormal findings on antenatal screening of mother: Secondary | ICD-10-CM

## 2016-11-20 DIAGNOSIS — R509 Fever, unspecified: Secondary | ICD-10-CM

## 2016-11-20 DIAGNOSIS — O10019 Pre-existing essential hypertension complicating pregnancy, unspecified trimester: Secondary | ICD-10-CM

## 2016-11-20 DIAGNOSIS — O10013 Pre-existing essential hypertension complicating pregnancy, third trimester: Secondary | ICD-10-CM

## 2016-11-20 DIAGNOSIS — O280 Abnormal hematological finding on antenatal screening of mother: Secondary | ICD-10-CM

## 2016-11-20 DIAGNOSIS — J189 Pneumonia, unspecified organism: Secondary | ICD-10-CM

## 2016-11-20 DIAGNOSIS — Z758 Other problems related to medical facilities and other health care: Secondary | ICD-10-CM

## 2016-11-20 LAB — GLUCOSE, CAPILLARY
GLUCOSE-CAPILLARY: 95 mg/dL (ref 65–99)
Glucose-Capillary: 80 mg/dL (ref 65–99)

## 2016-11-20 LAB — COMPREHENSIVE METABOLIC PANEL
ALT: 41 U/L (ref 14–54)
AST: 52 U/L — AB (ref 15–41)
Albumin: 2.6 g/dL — ABNORMAL LOW (ref 3.5–5.0)
Alkaline Phosphatase: 228 U/L — ABNORMAL HIGH (ref 38–126)
Anion gap: 11 (ref 5–15)
BILIRUBIN TOTAL: 0.3 mg/dL (ref 0.3–1.2)
BUN: 16 mg/dL (ref 6–20)
CO2: 18 mmol/L — ABNORMAL LOW (ref 22–32)
Calcium: 8.6 mg/dL — ABNORMAL LOW (ref 8.9–10.3)
Chloride: 102 mmol/L (ref 101–111)
Creatinine, Ser: 0.94 mg/dL (ref 0.44–1.00)
GFR calc Af Amer: >60 mL/min (ref >60)
Glucose, Bld: 97 mg/dL (ref 65–99)
POTASSIUM: 4.1 mmol/L (ref 3.5–5.1)
Sodium: 131 mmol/L — ABNORMAL LOW (ref 135–145)
Total Protein: 7.1 g/dL (ref 6.5–8.1)

## 2016-11-20 LAB — TYPE AND SCREEN
ABO/RH(D): O POS
ANTIBODY SCREEN: NEGATIVE

## 2016-11-20 LAB — CBC
HEMATOCRIT: 34.2 % — AB (ref 36.0–46.0)
Hemoglobin: 11.9 g/dL — ABNORMAL LOW (ref 12.0–15.0)
MCH: 29.5 pg (ref 26.0–34.0)
MCHC: 34.8 g/dL (ref 30.0–36.0)
MCV: 84.7 fL (ref 78.0–100.0)
Platelets: 141 10*3/uL — ABNORMAL LOW (ref 150–400)
RBC: 4.04 MIL/uL (ref 3.87–5.11)
RDW: 15.9 % — AB (ref 11.5–15.5)
WBC: 6 10*3/uL (ref 4.0–10.5)

## 2016-11-20 LAB — PROTEIN / CREATININE RATIO, URINE
CREATININE, URINE: 72 mg/dL
Protein Creatinine Ratio: 0.36 mg/mg{Cre} — ABNORMAL HIGH (ref 0.00–0.15)
Total Protein, Urine: 26 mg/dL

## 2016-11-20 MED ORDER — DOCUSATE SODIUM 100 MG PO CAPS: 100.0000 mg | ORAL_CAPSULE | Freq: Every day | ORAL | Status: DC

## 2016-11-20 MED ORDER — CALCIUM CARBONATE ANTACID 500 MG PO CHEW: 2.0000 | CHEWABLE_TABLET | ORAL | Status: DC | PRN

## 2016-11-20 MED ORDER — ZOLPIDEM TARTRATE 5 MG PO TABS: 5.0000 mg | ORAL_TABLET | Freq: Every evening | ORAL | Status: DC | PRN

## 2016-11-20 MED ORDER — GLYBURIDE 5 MG PO TABS
10.0000 mg | ORAL_TABLET | Freq: Every day | ORAL | Status: DC
  Administered 2016-11-20: 10 mg via ORAL
  Filled 2016-11-20 (×2): qty 2

## 2016-11-20 MED ORDER — ACETAMINOPHEN 500 MG PO TABS
1000.0000 mg | ORAL_TABLET | Freq: Once | ORAL | Status: AC
  Administered 2016-11-20: 1000 mg via ORAL
  Filled 2016-11-20: qty 2

## 2016-11-20 MED ORDER — ACETAMINOPHEN 325 MG PO TABS
650.0000 mg | ORAL_TABLET | ORAL | Status: DC | PRN
Start: 2016-11-20 — End: 2016-11-21

## 2016-11-20 MED ORDER — PRENATAL MULTIVITAMIN CH
1.0000 | ORAL_TABLET | Freq: Every day | ORAL | Status: DC
  Filled 2016-11-20: qty 1

## 2016-11-20 MED ORDER — FOLIC ACID 1 MG PO TABS
1.0000 mg | ORAL_TABLET | Freq: Every day | ORAL | Status: DC
  Filled 2016-11-20 (×2): qty 1

## 2016-11-20 MED ORDER — LABETALOL HCL 100 MG PO TABS
100.0000 mg | ORAL_TABLET | Freq: Two times a day (BID) | ORAL | Status: DC
  Administered 2016-11-20 – 2016-11-21 (×2): 100 mg via ORAL
  Filled 2016-11-20 (×2): qty 1

## 2016-11-20 NOTE — Progress Notes (Signed)
[employer] video interpreter Jari FavreOscar [phone] used for encounter.  AFI not performed. Pt advised she will require further evaluation in MAU due to non-reactive NST today. She voiced understanding.

## 2016-11-20 NOTE — Progress Notes (Signed)
Sharen CounterLisa Leftwich-Kirby, CNM requested to have pt scheduled for IOL for 11/24/16.  IOL scheduled for 11/24/16 @ 0730.  Provider to notify pt.

## 2016-11-20 NOTE — H&P (Signed)
[name] Perry is a 41 y.o. female [phone] [facility] 2050[facility] with CHTN on meds, GDM on glyburide, who presents to maternity admissions sent from the office for nonreactive NST.  Pt also reports decreased fetal movement.  While in MAU, pt had FHR tracing with few 10 x 10 accels and only 1-2 15x15 in several hours with periods of minimal variablity.  BPP 8/8.  BP wnl but pt developed a h/a in MAU so preeclampsia labs ordered and results borderline with platelets 141, AST 52, and creatinine 0.94.  P/C ratio pending.  H/a resolved with Tylenol.  Pt reports fetal movement is normal while in MAU. She denies regular contractions, LOF, vaginal bleeding, vaginal itching/burning, urinary symptoms, h/a, dizziness, n/v, or fever/chills.    OB History    Gravida Para Term Preterm AB Living   6 3 3   2 3    SAB TAB Ectopic Multiple Live Births   1   1 0 3     Past Medical History:  Diagnosis Date  . Anxiety   . Ectopic pregnancy march  . Gestational diabetes    glyburide  . Heart palpitations    anxiety related  . Hypertension 2010   Past Surgical History:  Procedure Laterality Date  . INSERTION OF MESH N/A 01/11/2014   Procedure: INSERTION OF MESH;  Surgeon: Axel FillerArmando Ramirez, MD;  Location: [facility] OR;  Service: General;  Laterality: N/A;  . UMBILICAL HERNIA REPAIR N/A 01/11/2014   Procedure: LAPAROSCOPIC UMBILICAL HERNIA;  Surgeon: Axel FillerArmando Ramirez, MD;  Location: [facility] OR;  Service: General;  Laterality: N/A;   Family History: family history includes Alcohol abuse in her father; Cirrhosis in her mother; Ulcers in her mother. Social History:  reports that she has never smoked. She has never used smokeless tobacco. She reports that she does not drink alcohol or use drugs.     Maternal Diabetes: No Genetic Screening: Abnormal:  Results: Elevated risk of Trisomy 21, Elevated risk of Trisomy 18 Maternal Ultrasounds/Referrals: Normal Fetal Ultrasounds or other Referrals:  None Maternal Substance  Abuse:  No Significant Maternal Medications:  Meds include: Other:  Significant Maternal Lab Results:  Lab values include: Group B Strep negative Other Comments:  Glyburide and Labetalol  ROS Maternal Medical History:  Reason for admission: Nonreactive NST  Contractions: Onset was 6-12 hours ago.   Frequency: irregular.   Perceived severity is mild.    Fetal activity: Perceived fetal activity is decreased.   Last perceived fetal movement was within the past hour.    Prenatal complications: PIH.   Prenatal Complications - Diabetes: gestational. Diabetes is managed by oral agent (monotherapy).      Dilation: 3 Effacement (%): 50 Station: -2 Exam by:: L Leftwich-Kirby CNM Blood pressure 107/64, pulse 89, temperature 97.5 F (36.4 C), temperature source Oral, resp. rate 18, last menstrual period 02/16/2016, unknown if currently breastfeeding. Maternal Exam:  Uterine Assessment: Contraction strength is mild.  Contraction frequency is irregular.   Abdomen: Fetal presentation: vertex  Cervix: Cervix evaluated by digital exam.     Fetal Exam Fetal Monitor Review: Mode: ultrasound.   Baseline rate: 150.  Variability: moderate (6-25 bpm).   Pattern: accelerations present.       Physical Exam  Nursing note and vitals reviewed. Constitutional: She is oriented to person, place, and time. She appears well-developed and well-nourished.  Neck: Normal range of motion.  Cardiovascular: Normal rate, regular rhythm and normal heart sounds.   Respiratory: Effort normal and breath sounds normal.  GI: Soft.  Musculoskeletal: Normal range of motion.  Neurological: She is alert and oriented to person, place, and time.  Skin: Skin is warm and dry.  Psychiatric: She has a normal mood and affect. Her behavior is normal. Judgment and thought content normal.    Prenatal labs: ABO, Rh: O/Positive/-- (10/16 0000) Antibody: Negative (10/16 0000) Rubella: Immune (10/16 0000) RPR: NON REAC  (12/27 0956)  HBsAg: Negative (10/16 0000)  HIV: NONREACTIVE (12/27 0956)  GBS: Negative (02/15 0000)   Assessment/Plan: 1. Language barrier   2. Gestational diabetes mellitus (GDM) affecting pregnancy   3. Abnormal quad screen   4. Supervision of high risk pregnancy in third trimester   5. Antepartum multigravida of advanced maternal age   46. NST (non-stress test) nonreactive      Admit to antepartum for observation Continuous EFM Repeat preeclampsia labs in am   Sharen Counter 11/20/2016, 7:35 PM

## 2016-11-20 NOTE — MAU Note (Signed)
Pt sent from clinic, non-reactive tracing.   Hx of CHTN, on meds, gestational diabetic on Glyburide.

## 2016-11-20 NOTE — Progress Notes (Signed)
   PRENATAL VISIT NOTE  Subjective:  [name] Perry is a [age] y.o. [id number] at 6518w2d being seen today for ongoing prenatal care.  She is currently monitored for the following issues for this high-risk pregnancy and has AMA (advanced maternal age) multigravida 35+; Hypertension in pregnancy, essential, antepartum; Antepartum multigravida of advanced maternal age; Supervision of high-risk pregnancy; History of gestational diabetes in prior pregnancy, currently pregnant in second trimester; Abnormal quad screen; Gestational diabetes mellitus (GDM) affecting pregnancy; and Language barrier on her problem list.  Patient reports no complaints.  Contractions: Irregular. Vag. Bleeding: None.  Movement: Present. Denies leaking of fluid.   The following portions of the patient's history were reviewed and updated as appropriate: allergies, current medications, past family history, past medical history, past social history, past surgical history and problem list. Problem list updated.  Objective:   Vitals:   11/20/16 0900  BP: 115/77  Pulse: 88  Weight: 170 lb 9.6 oz (77.4 kg)    Fetal Status: Fetal Heart Rate (bpm): NST   Movement: Present     General:  Alert, oriented and cooperative. Patient is in no acute distress.  Skin: Skin is warm and dry. No rash noted.   Cardiovascular: Normal heart rate noted  Respiratory: Normal respiratory effort, no problems with respiration noted  Abdomen: Soft, gravid, appropriate for gestational age. Pain/Pressure: Present     Pelvic:  Cervical exam deferred        Extremities: Normal range of motion.  Edema: None  Mental Status: Normal mood and affect. Normal behavior. Normal judgment and thought content.   Assessment and Plan:  Pregnancy: [id number] at 1018w2d  1. Gestational diabetes mellitus (GDM) affecting pregnancy - Fetal nonstress test - non-reactive - US OB Limited - Patient sent to MAU for continued monitoring and likely BPP due to non-reactive NST -  Log book reviewed, 3 PP dinner values > 170, otherwise good control  2. Hypertension in pregnancy, essential, antepartum - Normotensive today   3. Supervision of high risk pregnancy in third trimester - GBS negative  Term labor symptoms and general obstetric precautions including but not limited to vaginal bleeding, contractions, leaking of fluid and fetal movement were reviewed in detail with the patient. Please refer to After Visit Summary for other counseling recommendations.    Sent to MAU for further evaluation. If discharged today will need NST this week.    Marny LowensteinJulie N Tanysha Quant, PA-C

## 2016-11-21 ENCOUNTER — Encounter (HOSPITAL_COMMUNITY): Payer: Self-pay | Admitting: *Deleted

## 2016-11-21 ENCOUNTER — Inpatient Hospital Stay (HOSPITAL_COMMUNITY): Payer: Medicaid Other

## 2016-11-21 DIAGNOSIS — Z3A38 38 weeks gestation of pregnancy: Secondary | ICD-10-CM

## 2016-11-21 DIAGNOSIS — O24425 Gestational diabetes mellitus in childbirth, controlled by oral hypoglycemic drugs: Secondary | ICD-10-CM

## 2016-11-21 DIAGNOSIS — O1092 Unspecified pre-existing hypertension complicating childbirth: Secondary | ICD-10-CM

## 2016-11-21 LAB — GLUCOSE, CAPILLARY
GLUCOSE-CAPILLARY: 140 mg/dL — AB (ref 65–99)
GLUCOSE-CAPILLARY: 190 mg/dL — AB (ref 65–99)
Glucose-Capillary: 70 mg/dL (ref 65–99)
Glucose-Capillary: 73 mg/dL (ref 65–99)
Glucose-Capillary: 84 mg/dL (ref 65–99)

## 2016-11-21 LAB — COMPREHENSIVE METABOLIC PANEL
ALK PHOS: 227 U/L — AB (ref 38–126)
ALT: 45 U/L (ref 14–54)
ALT: 48 U/L (ref 14–54)
ANION GAP: 10 (ref 5–15)
AST: 56 U/L — AB (ref 15–41)
AST: 63 U/L — ABNORMAL HIGH (ref 15–41)
Albumin: 2.5 g/dL — ABNORMAL LOW (ref 3.5–5.0)
Albumin: 2.4 g/dL — ABNORMAL LOW (ref 3.5–5.0)
Alkaline Phosphatase: 243 U/L — ABNORMAL HIGH (ref 38–126)
Anion gap: 11 (ref 5–15)
BILIRUBIN TOTAL: 0.4 mg/dL (ref 0.3–1.2)
BILIRUBIN TOTAL: 0.5 mg/dL (ref 0.3–1.2)
BUN: 20 mg/dL (ref 6–20)
BUN: 18 mg/dL (ref 6–20)
CALCIUM: 8.3 mg/dL — AB (ref 8.9–10.3)
CHLORIDE: 102 mmol/L (ref 101–111)
CO2: 18 mmol/L — ABNORMAL LOW (ref 22–32)
CO2: 19 mmol/L — ABNORMAL LOW (ref 22–32)
CREATININE: 0.93 mg/dL (ref 0.44–1.00)
Calcium: 8.3 mg/dL — ABNORMAL LOW (ref 8.9–10.3)
Chloride: 104 mmol/L (ref 101–111)
Creatinine, Ser: 0.91 mg/dL (ref 0.44–1.00)
GFR calc Af Amer: >60 mL/min (ref >60)
Glucose, Bld: 78 mg/dL (ref 65–99)
Glucose, Bld: 224 mg/dL — ABNORMAL HIGH (ref 65–99)
Potassium: 3.9 mmol/L (ref 3.5–5.1)
Potassium: 3.8 mmol/L (ref 3.5–5.1)
SODIUM: 133 mmol/L — AB (ref 135–145)
Sodium: 131 mmol/L — ABNORMAL LOW (ref 135–145)
TOTAL PROTEIN: 6.8 g/dL (ref 6.5–8.1)
TOTAL PROTEIN: 6.9 g/dL (ref 6.5–8.1)

## 2016-11-21 LAB — URINALYSIS, ROUTINE W REFLEX MICROSCOPIC
Bilirubin Urine: NEGATIVE
GLUCOSE, UA: NEGATIVE mg/dL
Ketones, ur: NEGATIVE mg/dL
Nitrite: NEGATIVE
PROTEIN: NEGATIVE mg/dL
Specific Gravity, Urine: 1.008 (ref 1.005–1.030)
pH: 7 (ref 5.0–8.0)

## 2016-11-21 LAB — CBC WITH DIFFERENTIAL/PLATELET
BASOS ABS: 0 10*3/uL (ref 0.0–0.1)
Basophils Relative: 0 %
Eosinophils Absolute: 0 10*3/uL (ref 0.0–0.7)
Eosinophils Relative: 0 %
HEMATOCRIT: 32.9 % — AB (ref 36.0–46.0)
HEMOGLOBIN: 11.4 g/dL — AB (ref 12.0–15.0)
LYMPHS PCT: 19 %
Lymphs Abs: 1.2 10*3/uL (ref 0.7–4.0)
MCH: 29.2 pg (ref 26.0–34.0)
MCHC: 34.7 g/dL (ref 30.0–36.0)
MCV: 84.4 fL (ref 78.0–100.0)
Monocytes Absolute: 0.3 10*3/uL (ref 0.1–1.0)
Monocytes Relative: 4 %
NEUTROS ABS: 4.9 10*3/uL (ref 1.7–7.7)
Neutrophils Relative %: 77 %
Platelets: 129 10*3/uL — ABNORMAL LOW (ref 150–400)
RBC: 3.9 MIL/uL (ref 3.87–5.11)
RDW: 16 % — ABNORMAL HIGH (ref 11.5–15.5)
WBC: 6.4 10*3/uL (ref 4.0–10.5)

## 2016-11-21 LAB — CBC
HEMATOCRIT: 33.1 % — AB (ref 36.0–46.0)
Hemoglobin: 11.4 g/dL — ABNORMAL LOW (ref 12.0–15.0)
MCH: 29.4 pg (ref 26.0–34.0)
MCHC: 34.4 g/dL (ref 30.0–36.0)
MCV: 85.3 fL (ref 78.0–100.0)
Platelets: 134 10*3/uL — ABNORMAL LOW (ref 150–400)
RBC: 3.88 MIL/uL (ref 3.87–5.11)
RDW: 16 % — ABNORMAL HIGH (ref 11.5–15.5)
WBC: 5.5 10*3/uL (ref 4.0–10.5)

## 2016-11-21 LAB — INFLUENZA PANEL BY PCR (TYPE A & B)
INFLAPCR: NEGATIVE
INFLBPCR: NEGATIVE

## 2016-11-21 LAB — LACTIC ACID, PLASMA
LACTIC ACID, VENOUS: 1 mmol/L (ref 0.5–1.9)
Lactic Acid, Venous: 1 mmol/L (ref 0.5–1.9)

## 2016-11-21 MED ORDER — SODIUM CHLORIDE 0.9 % IV SOLN
INTRAVENOUS | Status: DC
  Administered 2016-11-21: 1.3 [IU]/h via INTRAVENOUS
  Filled 2016-11-21: qty 2.5

## 2016-11-21 MED ORDER — LACTATED RINGERS IV BOLUS (SEPSIS)
500.0000 mL | Freq: Once | INTRAVENOUS | Status: AC
  Administered 2016-11-21: 500 mL via INTRAVENOUS

## 2016-11-21 MED ORDER — OXYTOCIN 40 UNITS IN LACTATED RINGERS INFUSION - SIMPLE MED
1.0000 m[IU]/min | INTRAVENOUS | Status: DC
  Administered 2016-11-21: 41.667 m[IU]/min via INTRAVENOUS
  Administered 2016-11-21: 2 m[IU]/min via INTRAVENOUS
  Filled 2016-11-21: qty 1000

## 2016-11-21 MED ORDER — DEXTROSE 5 % IV SOLN
2.0000 g | INTRAVENOUS | Status: DC
  Administered 2016-11-21 – 2016-11-23 (×3): 2 g via INTRAVENOUS
  Filled 2016-11-21 (×3): qty 2

## 2016-11-21 MED ORDER — DIPHENHYDRAMINE HCL 50 MG/ML IJ SOLN: 12.5000 mg | INTRAMUSCULAR | Status: DC | PRN

## 2016-11-21 MED ORDER — INSULIN REGULAR BOLUS VIA INFUSION
0.0000 [IU] | Freq: Three times a day (TID) | INTRAVENOUS | Status: DC
  Filled 2016-11-21: qty 10

## 2016-11-21 MED ORDER — SIMETHICONE 80 MG PO CHEW: 80.0000 mg | CHEWABLE_TABLET | ORAL | Status: DC | PRN

## 2016-11-21 MED ORDER — FENTANYL CITRATE (PF) 100 MCG/2ML IJ SOLN: 100.0000 ug | INTRAMUSCULAR | Status: DC | PRN

## 2016-11-21 MED ORDER — LACTATED RINGERS IV SOLN: INTRAVENOUS | Status: DC

## 2016-11-21 MED ORDER — DIBUCAINE 1 % RE OINT: 1.0000 "application " | TOPICAL_OINTMENT | RECTAL | Status: DC | PRN

## 2016-11-21 MED ORDER — ACETAMINOPHEN 325 MG PO TABS
650.0000 mg | ORAL_TABLET | ORAL | Status: DC | PRN
Start: 2016-11-21 — End: 2016-11-23
  Administered 2016-11-21: 650 mg via ORAL
  Filled 2016-11-21: qty 2

## 2016-11-21 MED ORDER — LIDOCAINE HCL (PF) 1 % IJ SOLN
INTRAMUSCULAR | Status: AC
  Administered 2016-11-21: 30 mL via SUBCUTANEOUS
  Filled 2016-11-21: qty 30

## 2016-11-21 MED ORDER — ACETAMINOPHEN 500 MG PO TABS
1000.0000 mg | ORAL_TABLET | Freq: Four times a day (QID) | ORAL | Status: DC | PRN
  Administered 2016-11-21 (×2): 1000 mg via ORAL
  Filled 2016-11-21 (×2): qty 2

## 2016-11-21 MED ORDER — PHENYLEPHRINE 40 MCG/ML (10ML) SYRINGE FOR IV PUSH (FOR BLOOD PRESSURE SUPPORT)
80.0000 ug | PREFILLED_SYRINGE | INTRAVENOUS | Status: DC | PRN
  Filled 2016-11-21: qty 5

## 2016-11-21 MED ORDER — DIPHENHYDRAMINE HCL 25 MG PO CAPS
25.0000 mg | ORAL_CAPSULE | Freq: Four times a day (QID) | ORAL | Status: DC | PRN
Start: 2016-11-21 — End: 2016-11-23

## 2016-11-21 MED ORDER — WITCH HAZEL-GLYCERIN EX PADS: 1.0000 "application " | MEDICATED_PAD | CUTANEOUS | Status: DC | PRN

## 2016-11-21 MED ORDER — SENNOSIDES-DOCUSATE SODIUM 8.6-50 MG PO TABS
2.0000 | ORAL_TABLET | ORAL | Status: DC
  Administered 2016-11-21 – 2016-11-23 (×2): 2 via ORAL
  Filled 2016-11-21 (×2): qty 2

## 2016-11-21 MED ORDER — DEXTROSE 5 % IV SOLN
500.0000 mg | INTRAVENOUS | Status: DC
  Administered 2016-11-21 – 2016-11-23 (×3): 500 mg via INTRAVENOUS
  Filled 2016-11-21 (×3): qty 500

## 2016-11-21 MED ORDER — DEXTROSE IN LACTATED RINGERS 5 % IV SOLN
INTRAVENOUS | Status: DC
  Administered 2016-11-21: 13:00:00 via INTRAVENOUS

## 2016-11-21 MED ORDER — TERBUTALINE SULFATE 1 MG/ML IJ SOLN
0.2500 mg | Freq: Once | INTRAMUSCULAR | Status: DC | PRN
  Filled 2016-11-21: qty 1

## 2016-11-21 MED ORDER — FENTANYL 2.5 MCG/ML BUPIVACAINE 1/10 % EPIDURAL INFUSION (WH - ANES): 14.0000 mL/h | INTRAMUSCULAR | Status: DC | PRN

## 2016-11-21 MED ORDER — DEXTROSE 50 % IV SOLN: 25.0000 mL | INTRAVENOUS | Status: DC | PRN

## 2016-11-21 MED ORDER — DEXTROSE-NACL 5-0.45 % IV SOLN
INTRAVENOUS | Status: DC
  Administered 2016-11-21: 12:00:00 via INTRAVENOUS

## 2016-11-21 MED ORDER — IBUPROFEN 600 MG PO TABS
600.0000 mg | ORAL_TABLET | Freq: Four times a day (QID) | ORAL | Status: DC
  Administered 2016-11-21 – 2016-11-23 (×7): 600 mg via ORAL
  Filled 2016-11-21 (×7): qty 1

## 2016-11-21 MED ORDER — SODIUM CHLORIDE 0.9 % IV SOLN
INTRAVENOUS | Status: DC
  Administered 2016-11-21: 12:00:00 via INTRAVENOUS

## 2016-11-21 MED ORDER — COCONUT OIL OIL: 1.0000 "application " | TOPICAL_OIL | Status: DC | PRN

## 2016-11-21 MED ORDER — ONDANSETRON HCL 4 MG/2ML IJ SOLN: 4.0000 mg | INTRAMUSCULAR | Status: DC | PRN

## 2016-11-21 MED ORDER — EPHEDRINE 5 MG/ML INJ
10.0000 mg | INTRAVENOUS | Status: DC | PRN
  Filled 2016-11-21: qty 4

## 2016-11-21 MED ORDER — LACTATED RINGERS IV SOLN
500.0000 mL | Freq: Once | INTRAVENOUS | Status: AC
  Administered 2016-11-21: 500 mL via INTRAVENOUS

## 2016-11-21 MED ORDER — SODIUM CHLORIDE 0.9 % IV SOLN
INTRAVENOUS | Status: DC
  Filled 2016-11-21: qty 2.5

## 2016-11-21 MED ORDER — TETANUS-DIPHTH-ACELL PERTUSSIS 5-2.5-18.5 LF-MCG/0.5 IM SUSP: 0.5000 mL | Freq: Once | INTRAMUSCULAR | Status: DC

## 2016-11-21 MED ORDER — LACTATED RINGERS IV SOLN
INTRAVENOUS | Status: DC
  Administered 2016-11-21 (×2): via INTRAVENOUS

## 2016-11-21 MED ORDER — ONDANSETRON HCL 4 MG PO TABS: 4.0000 mg | ORAL_TABLET | ORAL | Status: DC | PRN

## 2016-11-21 MED ORDER — PRENATAL MULTIVITAMIN CH
1.0000 | ORAL_TABLET | Freq: Every day | ORAL | Status: DC
  Administered 2016-11-22: 1 via ORAL
  Filled 2016-11-21: qty 1

## 2016-11-21 MED ORDER — ZOLPIDEM TARTRATE 5 MG PO TABS: 5.0000 mg | ORAL_TABLET | Freq: Every evening | ORAL | Status: DC | PRN

## 2016-11-21 MED ORDER — BENZOCAINE-MENTHOL 20-0.5 % EX AERO
1.0000 "application " | INHALATION_SPRAY | CUTANEOUS | Status: DC | PRN
  Filled 2016-11-21: qty 56

## 2016-11-21 NOTE — Progress Notes (Signed)
Patient ID: Alaiya Martindelcampo, female   DOB: [birth year], 41 y.o.   MRN: [id number]   Called by nurse for fetal tachycardia despite interventions, and temp 101.8 axillary  Amia Rynders is a 41 y.o. [id number] at 55w3dw/ CHTN and A2DM admitted for observation d/t NRNST w/ BPP 8/8 and elevated creatinine.   Subjective: Reports mild headache that comes and goes, denies need for meds. Denies visual changes, ruq/epigastric pain, n/vd, cough, runny nose, sore throat, uti sx, flank pain. Reports headache, fever/chills started yesterday before coming to hospital, checked her temp at home and was normal. Denies feeling sick.   Objective: BP 110/69   Pulse (!) 113   Temp (!) 101.8 F (38.8 C) (Axillary)   Resp 16   Ht '5\' 3"'$  (1.6 m)   Wt 77.4 kg (170 lb 9.6 oz)   LMP 02/16/2016   BMI 30.22 kg/m  No intake/output data recorded.  Appears well, no acute distress HRR, tachycardic LCTAB Abd: mild tenderness to palpation Rt lower quadrant Neg CVAT FHT:  FHR: 165 bpm, variability: minimal ,  accelerations:  10x10  decelerations:  Absent Recently received 5041mLR bolus w/o change in FHR UC:   Currently irregular- had become regular couple of hours ago  SVE:   4/60/-2, vtx  Labs: Lab Results  Component Value Date   WBC 6.0 11/20/2016   HGB 11.9 (L) 11/20/2016   HCT 34.2 (L) 11/20/2016   MCV 84.7 11/20/2016   PLT 141 (L) 11/20/2016   CBG (last 3)   Recent Labs  11/20/16 1309 11/20/16 2227  GLUCAP 80 95   Assessment: 3841w3degnant CHTN on Labetalol A2DM on glyburide '10mg'$  hs Fever of unknown origin Cat II FHR Elevated creatinine, mild thrombocytopenia, AST slightly elevated  Plan:  Discussed all w/ Dr. StiNehemiah Settleill proceed with CBC w/ diff, CMP, UA, blood cultures x 2, lactic acid, flu swab CXR APAP 1gm po  Discussed all w/ pt via I-pad Spanish interpreter  BooTawnya CrookM, WHNP-BC 11/21/2016, 3:45 AM

## 2016-11-21 NOTE — Progress Notes (Signed)
Pt given Incentive Spirometer with instructions in spanish. Pt and FOB able to practice.

## 2016-11-21 NOTE — Lactation Note (Signed)
This note was copied from a baby's chart. Lactation Consultation Note Initial visit with spanish interpreter, Viria at 8 hours of age.  Baby very fussy with FOB, diaper clean and baby swaddled.  LC allowed baby to suck gloved finger, baby was biting and disorganized at this time.  Baby was patted and calmed some.  Mom reports older child still nursing but mom doesn't have enough milk for this baby and wants to give formula.  Mom also reports only nursing older children from one breast due to small nipple. LC did not assess mom's breasts at this visit.  Baby has had formula already for low blood sugar.  Interpreter needed to leave, RN at bedside to assess mom and needed to get mom up to bathroom so LC will need to follow up later.  RN reports mom latched baby again and then nursery RN reported low blood sugar of 39 so Rn assisted with bottle feeding of formula.  [facility], but not reviewed at this time.   Patient Name: [name] Perry ZOXWR'UToday's Date: 11/21/2016 Reason for consult: Initial assessment   Maternal Data Has patient been taught Hand Expression?: Yes Does the patient have breastfeeding experience prior to this delivery?: Yes  Feeding Feeding Type: Bottle Fed - Formula Nipple Type: Slow - flow Length of feed: 20 min  LATCH Score/Interventions                Intervention(s): Breastfeeding basics reviewed     Lactation Tools Discussed/Used     Consult Status Consult Status: Follow-up Date: 11/22/16 Follow-up type: In-patient    Shoptaw, Arvella MerlesJana Lynn 11/21/2016, 10:16 PM

## 2016-11-21 NOTE — Progress Notes (Signed)
Interpreter, Eda Royal at bs to see pt.

## 2016-11-21 NOTE — Progress Notes (Signed)
Pateint seen with interpreter. Labs reviewed. D/W patient that she has diagnosis of cHTN with superimposed Pre-eclampsia. BP's have been normal range, but labs are increasing. Will recheck labs at this time. Plan to start induction for superimposed pre-eclampsia. Will monitor BP. Pit 2x2. Epidural upon request.   Ernestina PennaNicholas Misti Towle, MD 11/21/16  1001

## 2016-11-21 NOTE — Progress Notes (Signed)
Patient ID: [name] Perry, female   DOB: [birth year], 41 y.o.   MRN: [id number]  [name] Perry is a 41 y.o. [id number] at [redacted]w[redacted]d w/ CHTN and A2DM admitted for observation d/t NRNST w/ BPP 8/8 and elevated creatinine  Subjective: Mild intermittent headache, otherwise well  Objective: BP 110/69   Pulse (!) 113   Temp (!) 100.5 F (38.1 C) (Axillary)   Resp 16   Ht 5\' 3"  (1.6 m)   Wt 77.4 kg (170 lb 9.6 oz)   LMP 02/16/2016   BMI 30.22 kg/m  No intake/output data recorded.  FHT:  FHR: 145 bpm, variability: minimal (has periods of absent),  accelerations:  Abscent,  decelerations:  Absent UC:   q 7-34mins  SVE:   Dilation: 4 Effacement (%): 50 Station: -2 Exam by:: [name] Roes, RN  Labs: Lab Results  Component Value Date   WBC 6.4 11/21/2016   HGB 11.4 (L) 11/21/2016   HCT 32.9 (L) 11/21/2016   MCV 84.4 11/21/2016   PLT 129 (L) 11/21/2016   Results for orders placed or performed during the hospital encounter of 11/20/16 (from the past 24 hour(s))  Glucose, capillary     Status: None   Collection Time: 11/20/16  1:09 PM  Result Value Ref Range   Glucose-Capillary 80 65 - 99 mg/dL  CBC     Status: Abnormal   Collection Time: 11/20/16  4:24 PM  Result Value Ref Range   WBC 6.0 4.0 - 10.5 K/uL   RBC 4.04 3.87 - 5.11 MIL/uL   Hemoglobin 11.9 (L) 12.0 - 15.0 g/dL   HCT 14.7 (L) 82.9 - 56.2 %   MCV 84.7 78.0 - 100.0 fL   MCH 29.5 26.0 - 34.0 pg   MCHC 34.8 30.0 - 36.0 g/dL   RDW 13.0 (H) 86.5 - 78.4 %   Platelets 141 (L) 150 - 400 K/uL  Comprehensive metabolic panel     Status: Abnormal   Collection Time: 11/20/16  4:24 PM  Result Value Ref Range   Sodium 131 (L) 135 - 145 mmol/L   Potassium 4.1 3.5 - 5.1 mmol/L   Chloride 102 101 - 111 mmol/L   CO2 18 (L) 22 - 32 mmol/L   Glucose, Bld 97 65 - 99 mg/dL   BUN 16 6 - 20 mg/dL   Creatinine, Ser 6.96 0.44 - 1.00 mg/dL   Calcium 8.6 (L) 8.9 - 10.3 mg/dL   Total Protein 7.1 6.5 - 8.1 g/dL   Albumin 2.6 (L) 3.5 - 5.0  g/dL   AST 52 (H) 15 - 41 U/L   ALT 41 14 - 54 U/L   Alkaline Phosphatase 228 (H) 38 - 126 U/L   Total Bilirubin 0.3 0.3 - 1.2 mg/dL   GFR calc non Af Amer >60 >60 mL/min   GFR calc Af Amer >60 >60 mL/min   Anion gap 11 5 - 15  Protein / creatinine ratio, urine     Status: Abnormal   Collection Time: 11/20/16  7:46 PM  Result Value Ref Range   Creatinine, Urine 72.00 mg/dL   Total Protein, Urine 26 mg/dL   Protein Creatinine Ratio 0.36 (H) 0.00 - 0.15 mg/mg[Cre]  Type and screen [facility]     Status: None   Collection Time: 11/20/16  8:11 PM  Result Value Ref Range   ABO/RH(D) O POS    Antibody Screen NEG    Sample Expiration 11/23/2016   Glucose, capillary     Status: None  Collection Time: 11/20/16 10:27 PM  Result Value Ref Range   Glucose-Capillary 95 65 - 99 mg/dL  CBC with Differential/Platelet     Status: Abnormal   Collection Time: 11/21/16  3:46 AM  Result Value Ref Range   WBC 6.4 4.0 - 10.5 K/uL   RBC 3.90 3.87 - 5.11 MIL/uL   Hemoglobin 11.4 (L) 12.0 - 15.0 g/dL   HCT 16.1 (L) 09.6 - 04.5 %   MCV 84.4 78.0 - 100.0 fL   MCH 29.2 26.0 - 34.0 pg   MCHC 34.7 30.0 - 36.0 g/dL   RDW 40.9 (H) 81.1 - 91.4 %   Platelets 129 (L) 150 - 400 K/uL   Neutrophils Relative % 77 %   Neutro Abs 4.9 1.7 - 7.7 K/uL   Lymphocytes Relative 19 %   Lymphs Abs 1.2 0.7 - 4.0 K/uL   Monocytes Relative 4 %   Monocytes Absolute 0.3 0.1 - 1.0 K/uL   Eosinophils Relative 0 %   Eosinophils Absolute 0.0 0.0 - 0.7 K/uL   Basophils Relative 0 %   Basophils Absolute 0.0 0.0 - 0.1 K/uL  Comprehensive metabolic panel     Status: Abnormal   Collection Time: 11/21/16  3:46 AM  Result Value Ref Range   Sodium 131 (L) 135 - 145 mmol/L   Potassium 3.9 3.5 - 5.1 mmol/L   Chloride 102 101 - 111 mmol/L   CO2 18 (L) 22 - 32 mmol/L   Glucose, Bld 78 65 - 99 mg/dL   BUN 20 6 - 20 mg/dL   Creatinine, Ser 7.82 0.44 - 1.00 mg/dL   Calcium 8.3 (L) 8.9 - 10.3 mg/dL   Total Protein  6.8 6.5 - 8.1 g/dL   Albumin 2.5 (L) 3.5 - 5.0 g/dL   AST 56 (H) 15 - 41 U/L   ALT 45 14 - 54 U/L   Alkaline Phosphatase 243 (H) 38 - 126 U/L   Total Bilirubin 0.4 0.3 - 1.2 mg/dL   GFR calc non Af Amer >60 >60 mL/min   GFR calc Af Amer >60 >60 mL/min   Anion gap 11 5 - 15  Lactic acid, plasma     Status: None   Collection Time: 11/21/16  3:46 AM  Result Value Ref Range   Lactic Acid, Venous 1.0 0.5 - 1.9 mmol/L  Influenza panel by PCR (type A & B)     Status: None   Collection Time: 11/21/16  3:55 AM  Result Value Ref Range   Influenza A By PCR NEGATIVE NEGATIVE   Influenza B By PCR NEGATIVE NEGATIVE  Urinalysis, Routine w reflex microscopic     Status: Abnormal   Collection Time: 11/21/16  3:58 AM  Result Value Ref Range   Color, Urine YELLOW YELLOW   APPearance HAZY (A) CLEAR   Specific Gravity, Urine 1.008 1.005 - 1.030   pH 7.0 5.0 - 8.0   Glucose, UA NEGATIVE NEGATIVE mg/dL   Hgb urine dipstick MODERATE (A) NEGATIVE   Bilirubin Urine NEGATIVE NEGATIVE   Ketones, ur NEGATIVE NEGATIVE mg/dL   Protein, ur NEGATIVE NEGATIVE mg/dL   Nitrite NEGATIVE NEGATIVE   Leukocytes, UA MODERATE (A) NEGATIVE   RBC / HPF 0-5 0 - 5 RBC/hpf   WBC, UA 6-30 0 - 5 WBC/hpf   Bacteria, UA RARE (A) NONE SEEN   Squamous Epithelial / LPF 0-5 (A) NONE SEEN   Mucous PRESENT     CXR:  CLINICAL DATA:  Acute onset of fever, chills and  headache. Initial encounter.  EXAM: CHEST  2 VIEW  COMPARISON:  Chest radiograph performed 12/24/2013  FINDINGS: The lungs are well-aerated. Minimal right basilar opacity raises concern for pneumonia. There is no evidence of pleural effusion or pneumothorax. A calcified granuloma is noted at the right lung apex.  The heart is normal in size; the mediastinal contour is within normal limits. No acute osseous abnormalities are seen.  IMPRESSION: Minimal right basilar airspace opacity raises concern for pneumonia.  Electronically Signed   By: Roanna RaiderJeffery   Chang M.D.   On: 11/21/2016 05:11  Assessment: 3466w3d pregnant CHTN on Labetalol, bp's well controlled A2DM on glyburide 10mg  hs Febrile Rt basilar pneumonia Cat II FHR Labs stable, plt dropped slightly to 129  Plan: Discussed all labs/cxr/minimal variability-at times absent w/ Dr. Adrian BlackwaterStinson, will treat pneumonia w/ Rocephin 2gm IV q24hr and Azithromycin 500mg  IV q 24hr  Marge DuncansBooker, Evona Westra Randall CNM, WHNP-BC 11/21/2016, 6:21 AM

## 2016-11-21 NOTE — Anesthesia Pain Management Evaluation Note (Signed)
  CRNA Pain Management Visit Note  Patient: [name] DakinsFilomena Perry, 41 y.o., female  "Hello [facility] am a [facility]. We have an anesthesia team available at all times to provide care throughout the hospital, including epidural management and anesthesia for C-section. I don't know your plan for the delivery whether it a natural birth, water birth, IV sedation, nitrous supplementation, doula or epidural, but we want to meet your pain goals."   1.Was your pain managed to your expectations on prior hospitalizations?   Yes   2.What is your expectation for pain management during this hospitalization?     Labor support without medications  3.How can we help you reach that goal? Be available if needed  Record the patient's initial score and the patient's pain goal.   Pain: 2  Pain Goal: 6 [facility] wants you to be able to say your pain was always managed very well.  [name] HickmanGREGORY,[name] Perry 11/21/2016

## 2016-11-21 NOTE — Progress Notes (Signed)
Dr Genevie AnnSchenk updated of pt's exam and IV Zithromax now infusing since Rocephin dose was late from night shift. Updated on last CBG 140. Order received to recheck CBG in 1 hour.

## 2016-11-22 LAB — CULTURE, OB URINE

## 2016-11-22 LAB — CBC
HCT: 30.7 % — ABNORMAL LOW (ref 36.0–46.0)
Hemoglobin: 10.7 g/dL — ABNORMAL LOW (ref 12.0–15.0)
MCH: 29.6 pg (ref 26.0–34.0)
MCHC: 34.9 g/dL (ref 30.0–36.0)
MCV: 85 fL (ref 78.0–100.0)
Platelets: 133 10*3/uL — ABNORMAL LOW (ref 150–400)
RBC: 3.61 MIL/uL — AB (ref 3.87–5.11)
RDW: 16.2 % — ABNORMAL HIGH (ref 11.5–15.5)
WBC: 7.3 10*3/uL (ref 4.0–10.5)

## 2016-11-22 LAB — COMPREHENSIVE METABOLIC PANEL
ALBUMIN: 2.4 g/dL — AB (ref 3.5–5.0)
ALK PHOS: 201 U/L — AB (ref 38–126)
ALT: 47 U/L (ref 14–54)
AST: 57 U/L — ABNORMAL HIGH (ref 15–41)
Anion gap: 8 (ref 5–15)
BUN: 20 mg/dL (ref 6–20)
CALCIUM: 8.7 mg/dL — AB (ref 8.9–10.3)
CO2: 20 mmol/L — AB (ref 22–32)
CREATININE: 0.88 mg/dL (ref 0.44–1.00)
Chloride: 104 mmol/L (ref 101–111)
GFR calc non Af Amer: >60 mL/min (ref >60)
GLUCOSE: 100 mg/dL — AB (ref 65–99)
Potassium: 4.1 mmol/L (ref 3.5–5.1)
SODIUM: 132 mmol/L — AB (ref 135–145)
Total Bilirubin: 0.1 mg/dL — ABNORMAL LOW (ref 0.3–1.2)
Total Protein: 6.5 g/dL (ref 6.5–8.1)

## 2016-11-22 LAB — GLUCOSE, CAPILLARY: Glucose-Capillary: 106 mg/dL — ABNORMAL HIGH (ref 65–99)

## 2016-11-22 MED ORDER — IBUPROFEN 600 MG PO TABS: 600.0000 mg | ORAL_TABLET | Freq: Four times a day (QID) | ORAL | 0 refills | Status: DC

## 2016-11-22 NOTE — Progress Notes (Signed)
CSW acknowledged consult and met with MOB with hospital's Administrator, sports.  When CSW arrived, MOB was sitting in bed attaching and bonding with infant. MOB denied MH hx and communicated that MOB was experiencing grief and loss of a niece that had an unexpected death. MOB communicated that MOB received counseling and MOB's grief signs and symptoms declined.  CSW educated MOB about PPD. CSW informed MOB of possible supports and interventions to decrease PPD.  CSW also encouraged MOB to seek medical attention if needed for increased signs and symptoms for PPD.  There are no barriers to d/c.  Laurey Arrow, MSW, LCSW Clinical Social Work [phone]

## 2016-11-22 NOTE — Discharge Summary (Addendum)
Obstetric Discharge Summary Reason for Admission: 38+ weeks EGA with non- reactive NST, FUO, CHTN, and A2DM Prenatal Procedures: NST and ultrasound Intrapartum Procedures: spontaneous vaginal delivery and IV abx for FUO Postpartum Procedures: none Complications-Operative and Postpartum: 1st degree perineal laceration Hemoglobin  Date Value Ref Range Status  11/22/2016 10.7 (L) 12.0 - 15.0 g/dL Final  40/98/119110/16/2017 47.811.5 g/dL Final   HCT  Date Value Ref Range Status  11/22/2016 30.7 (L) 36.0 - 46.0 % Final  07/09/2016 36 % Final    Physical Exam:  General: alert Lochia: appropriate Uterine Fundus: firm and NT at U-1 DVT Evaluation: No evidence of DVT seen on physical exam. She has been afebrile since delivery, her sugars and BPs have been normal since then also. Discharge Diagnoses: Term Pregnancy-delivered  Discharge Information: Date: 11/22/2016 Activity: pelvic rest Diet: routine Medications: PNV and Ibuprofen Condition: stable Instructions: refer to practice specific booklet Discharge to: home Follow-up Information    RASCH, Iona HansenJENNIFER IRENE, NP Follow up.   Specialty:  Obstetrics and Gynecology Why:  She has an appt on 01-01-17. Please let them know that she will need to schedule a 2 hour GTT and wants an IUD. Contact information: [address] [place] [id number] [phone]           Newborn Data: Live born female  Birth Weight: 9 lb 1.3 oz (4120 g) APGAR: 8, 9  Home with awaiting peds to decide about disposition today.  Myra C Dove 11/22/2016, 7:00 AM      Reviewed patient's vital signs, labs, and reassessed patient. Cancelling discharge due to patient has not been afebrile >24 hours. Will remain on IV antibiotics tonight, reassess tomorrow and will likely switch to PO if afebrile through tomorrow. Explained plan to patient with interpretor present. Patient to use incentive spirometry, ambulate, and drink plenty of PO fluids.  Cleda ClarksElizabeth W. Tyke Outman, DO   OB Fellow Center [facility], [facility] 11/22/2016  10:32 AM

## 2016-11-22 NOTE — Discharge Instructions (Signed)
Instrucciones para la mamá sobre los cuidados en el hogar °(Home Care Instructions for Mom) °ACTIVIDAD °· Reanude sus actividades regulares de forma gradual. °· Descanse. Tome siestas cuando el bebé duerme. °· No levante objetos que pesen más de 10 libras (4,5 kg) hasta que el médico se lo autorice. °· Evite las actividades que demandan mucho esfuerzo y energía (que son extenuantes) hasta que el médico se lo autorice. Caminar a un ritmo tranquilo a moderado siempre es más seguro. °· Si tuvo un parto por cesárea: °? No pase la aspiradora, suba escaleras o conduzca un vehículo durante 4 o 6 semanas. °? Pídale a alguien que le brinde ayuda con las tareas domésticas hasta que pueda realizarlas por su cuenta. °? Haga ejercicios como se lo haya indicado el médico, si corresponde. ° °HEMORRAGIA VAGINAL °Probablemente continúe sangrando durante 4 o 6 semanas después del parto. Generalmente, la cantidad de sangre disminuye y el color se hace más claro con el transcurso del tiempo. Sin embargo, si usted está demasiado activa, el color de la sangre puede ser rojo brillante. Si necesita cambiarse la compresa higiénica en menos de una hora o tiene coágulos grandes: °· Permanezca acostada. °· Eleve los pies. °· Coloque compresas frías en la zona inferior del abdomen. °· Haga reposo. °· Comuníquese con su médico. °Si está amamantando, podría volver a tener su período entre las 8 semanas después del parto y el momento en que deje de amamantar. Si no está amamantando, volverá a tener su período 6 u 8 semanas después del parto. °CUIDADOS PERINEALES °La zona perineal o perineo, es la parte del cuerpo que se encuentra entre los muslos. Después del parto, esta zona necesita un cuidado especial. Siga las siguientes indicaciones como se lo haya indicado su médico. °· Tome baños de inmersión durante 15 o 20 minutos. °· Utilice apósitos o aerosoles analgésicos y cremas como se lo hayan indicado. °· No utilice tampones ni se haga duchas  vaginales hasta que el sangrado vaginal se haya detenido. °· Cada vez que vaya al baño: °? Use una botella perineal. °? Cámbiese el apósito. °? Use papel tisú en lugar de papel higiénico hasta que se cure la sutura. °· Haga ejercicios de Kegel todos los días. Los ejercicios Kegel ayudan a mantener los músculos que sostienen la vagina, la vejiga y los intestinos. Estos ejercicios se pueden realizar mientras está parada, sentada o acostada. Para hacer los ejercicios de Kegel: °? Tense los músculos del estómago y los que rodean el canal de parto. °? Mantenga esta posición durante unos segundos. °? Relájese. °? Repita hasta hacerlos 5 veces seguidas. °· Para evitar las hemorroides o que estas empeoren: °? Beba suficiente líquido para mantener la orina clara o de color amarillo pálido. °? Evite hacer fuerza al defecar. °? Tome los medicamentos y laxantes de venta libre como se lo haya indicado el médico. °CUIDADO DE LAS MAMAS °· Use un buen sostén. °· Evite tomar analgésicos de venta libre para las molestias de los pechos. °· Aplique hielo en los pechos para aliviar las molestias tanto como sea necesario: °? Ponga el hielo en una bolsa plástica. °? Coloque una toalla entre la piel y la bolsa de hielo. °? Aplique el hielo durante 20, o como se lo haya indicado el médico. ° °NUTRICIÓN °· Mantenga una dieta bien balanceada. °· No intente perder de peso rápidamente reduciendo el consumo de calorías. °· Tome sus vitaminas prenatales hasta el control de postparto o hasta que su médico se lo indique. ° °DEPRESIÓN POSTPARTO °  Puede sentir deseos de llorar sin motivo aparente y verse incapaz de enfrentarse a todos los cambios que implica tener un bebé. Este estado de ánimo se llama depresión postparto. La depresión postparto ocurre porque sus niveles hormonales sufren cambios después del parto. Si usted tiene depresión postparto, busque contención por parte de su pareja, sus amigos y su familia. Si la depresión no desaparece por  sí sola después de algunas semanas, concurra a su médico. °AUTOEXAMEN DE MAMAS °Realícese autoexámenes en el mismo momento cada mes. Si está amamantando, el mejor momento de controlar sus mamas es después de alimentar al bebé, cuando los pechos no están tan llenos. Si está amamantando y su período ya comenzó, controle sus mamas el día 5, 6 o 7 de su período. °Informe a su médico de cualquier protuberancia, bulto o secreción. Si está amamantando, las mamas normalmente tienen bultos. Esto es transitorio y no es un riesgo para la salud. °INTIMIDAD Y SEXUALIDAD °Debe evitar las relaciones sexuales durante al menos 3 o 4 semanas después del parto o hasta que el flujo de color rojo amarronado haya desaparecido completamente. Si no desea quedar embarazada nuevamente, use algún método anticonceptivo. Después del parto, puede quedar embarazada incluso si no ha tenido todavía el período. °SOLICITE ATENCIÓN MÉDICA SI: °· Se siente incapaz de controlar los cambios que implica tener un hijo y esos sentimientos no desaparecen después de algunas semanas. °· Detecta una protuberancia, bulto o secreción en sus mamas. ° °SOLICITE ATENCIÓN MÉDICA DE INMEDIATO SI: °· Debe cambiarse la compresa higiénica en 1 hora o menos. °· Tiene los siguientes síntomas: °? Dolor intenso o calambres en la parte inferior del abdomen. °? Una secreción vaginal con mal olor. °? Fiebre que no se alivia con los medicamentos. °? Una zona de la mama se pone roja y le causa dolor, y además usted tiene fiebre. °? Una pantorrilla enrojecida y con dolor. °? Repentino e intenso dolor en el pecho. °? Falta de aire. °? Micción dolorosa o con sangre. °? Problemas visuales. °· Vómitos durante 12 horas o más. °· Dolor de cabeza intenso. °· Tiene pensamientos serios acerca de lastimarse a usted misma o dañar al niño o a otra persona. ° °Esta información no tiene como fin reemplazar el consejo del médico. Asegúrese de hacerle al médico cualquier pregunta que  tenga. °Document Released: 09/10/2005 Document Revised: 01/02/2016 Document Reviewed: 03/14/2015 °Elsevier Interactive Patient Education © 2017 [employer] ° °

## 2016-11-22 NOTE — Progress Notes (Signed)
Patient ID: [name] Perry, female   DOB: 03-Oct-1975, 41 y.o.   MRN: [id number]  POSTPARTUM PROGRESS NOTE  Post Partum Day #1 Subjective:  [name] Perry is a 41 y.o. [id number] 5462w3d s/p NSVD, has known pneumonia (community acquired) on admission.  No acute events overnight.  Pt denies problems with ambulating, voiding or po intake.  She denies nausea or vomiting.  Pain is well controlled.  She has had flatus. She has not had bowel movement.  Lochia Small. Last fever was yesterday at 12:43 PM, 100.34F, patient states she has not felt feverish or having chills throughout the night or this morning. She denies coughing, admits to nasal congestion. Denies SOB or CP.   Objective: Blood pressure 120/77, pulse 67, temperature 97.4 F (36.3 C), temperature source Oral, resp. rate 18, height 5\' 3"  (1.6 m), weight 170 lb 9.6 oz (77.4 kg), last menstrual period 02/16/2016, unknown if currently breastfeeding.  Physical Exam:  General: alert, cooperative and no distress Lochia:normal flow Chest: CTAB, no wheezing or crackkles. No dyspnea. No coughing while in room Heart: RRR no m/r/g Abdomen: +BS, soft, nontender Uterine Fundus: firm, below umbilicus DVT Evaluation: No calf swelling or tenderness Extremities: No edema   Recent Labs  11/21/16 1038 11/22/16 0522  HGB 11.4* 10.7*  HCT 33.1* 30.7*    Assessment/Plan:  ASSESSMENT: [name] Perry is a 41 y.o. [id number] 162w3d s/p NSVD. Community acquired pneumonia, present on admission.  Continue IV antibiotics tonight, has not been 24 hours afebrile. Likely will switch to PO antibiotics tomorrow No leukocytosis, no coughing, no dypsnea Encouraged ambulation Increase oral intake of fluids Incentive spirometry encouraged Plan for discharge tomorrow, Breastfeeding, Lactation consult and Contraception IUD   LOS: 2 days   Jen MowElizabeth Mumaw, DO OB Fellow Center for [facility], [facility]  11/22/2016, 10:26 AM

## 2016-11-22 NOTE — Lactation Note (Signed)
This note was copied from a baby's chart. Lactation Consultation Note  Patient Name: [name] Perry ZHYQM'VToday's Date: 11/22/2016 Reason for consult: Follow-up assessment Interpreter used. Baby at 27 hr of life. Mom was requesting latch help. Upon entry RN was offering a bottle of formula to settle baby down. Placed baby in football position on the L breast. Mom has small wide spaced breast with lemon sized lumps under each arm. She reports every time she bf she gets the the lumps, she denies pain. She does not leak under her arm and no axillary nipple is present. Baby has a tight mouth. Demonstrated jaw massage and reviewed tummy time. After about 2 minutes of attempting baby will latch and maintain short bursts of sucking. Mom wanted to try supplementing at the breast. Used the 34Fr and formula. Baby will stay on the breast a little bit longer with the supplement but still does a lot of off/on. Mom requested a NS for the R breast because she used it last time. Fitted her with #20 but baby was too sleepy to eat from the other side. When baby came off the L breast the tissue was much softer. Mom is aware of lactation services and support group. She bf on demand and supplement as she desires.    Maternal Data    Feeding Feeding Type: Breast Milk with Formula added Length of feed: 20 min  LATCH Score/Interventions Latch: Repeated attempts needed to sustain latch, nipple held in mouth throughout feeding, stimulation needed to elicit sucking reflex. Intervention(s): Adjust position;Assist with latch;Breast compression  Audible Swallowing: Spontaneous and intermittent Intervention(s): Skin to skin;Hand expression;Alternate breast massage  Type of Nipple: Everted at rest and after stimulation  Comfort (Breast/Nipple): Soft / non-tender     Hold (Positioning): Full assist, staff holds infant at breast Intervention(s): Position options;Support Pillows  LATCH Score: 7  Lactation Tools  Discussed/Used Tools: 34F feeding tube / Syringe   Consult Status Consult Status: Follow-up Date: 11/23/16 Follow-up type: In-patient    [name] 11/22/2016, 5:45 PM

## 2016-11-22 NOTE — Lactation Note (Signed)
This note was copied from a baby's chart. Lactation Consultation Note  Patient Name: [name] Perry WUJWJ'XToday's Date: 11/22/2016 Reason for consult: Follow-up assessment Baby at 27 hr of life. Mom wants to bf baby but reports baby is refusing the breast. She reports baby just ate and she does not want to latch baby right now. Interpreter left her phone number on the white board for mom to call for help with latching when baby is ready. Mom denies breast or nipple pain. She is aware of lactation services and support group.     Maternal Data    Feeding Feeding Type: Breast Fed  LATCH Score/Interventions Latch: Repeated attempts needed to sustain latch, nipple held in mouth throughout feeding, stimulation needed to elicit sucking reflex. Intervention(s): Adjust position;Assist with latch;Breast massage;Breast compression  Audible Swallowing: A few with stimulation Intervention(s): Skin to skin;Hand expression  Type of Nipple: Everted at rest and after stimulation  Comfort (Breast/Nipple): Soft / non-tender     Hold (Positioning): Assistance needed to correctly position infant at breast and maintain latch. Intervention(s): Breastfeeding basics reviewed;Support Pillows;Position options;Skin to skin  LATCH Score: 7  Lactation Tools Discussed/Used     Consult Status Consult Status: Follow-up Date: 11/22/16 Follow-up type: In-patient    Rulon Eisenmengerlizabeth E Javani Spratt 11/22/2016, 4:49 PM

## 2016-11-23 ENCOUNTER — Other Ambulatory Visit: Payer: Self-pay

## 2016-11-23 DIAGNOSIS — J189 Pneumonia, unspecified organism: Secondary | ICD-10-CM

## 2016-11-23 LAB — GLUCOSE, CAPILLARY: GLUCOSE-CAPILLARY: 105 mg/dL — AB (ref 65–99)

## 2016-11-23 NOTE — Lactation Note (Signed)
This note was copied from a baby's chart. Lactation Consultation Note  [facility] told her "I don't want to breastfeed". She will ask her is she desires to see lactation.  Patient Name: [name] Perry ZOXWR'UToday's Date: 11/23/2016     Maternal Data    Feeding Feeding Type: Bottle Fed - Formula Nipple Type: Slow - flow  LATCH Score/Interventions                      Lactation Tools Discussed/Used     Consult Status      [name] Perry, [name] Perry 11/23/2016, 9:55 AM

## 2016-11-23 NOTE — Discharge Summary (Signed)
OB Discharge Summary     Patient Name: [name] Perry DOB: 1976-05-01 MRN: [id number]  Date of admission: 11/20/2016 Delivering MD: Ernestina Penna MICHAEL   Date of discharge: 11/23/2016  Admitting diagnosis: NON REACTIVE TRACING Intrauterine pregnancy: [redacted]w[redacted]d     Secondary diagnosis:  Active Problems:   NST (non-stress test) nonreactive   Labor abnormal   Normal spontaneous vaginal delivery   CAP (community acquired pneumonia)  Additional problems: None     Discharge diagnosis: Term Pregnancy Delivered, CHTN with superimposed preeclampsia, GDM A2 and RLL community acquired pneumonia                                                                                                Post partum procedures:None  Augmentation: AROM and Pitocin  Complications: None  Hospital course:  Induction of Labor With Vaginal Delivery   41 y.o. yo [id number] at [redacted]w[redacted]d was admitted to the hospital 11/20/2016 for induction of labor.  Indication for induction: Preeclampsia, A2 DM and non reactive NST.  Patient had an uncomplicated labor course as follows: Membrane Rupture Time/Date: 1:35 PM ,11/21/2016   Intrapartum Procedures: Episiotomy: None [1]                                         Lacerations:  1st degree [2];Perineal [11]  Patient had delivery of a Viable infant.  Information for the patient's newborn:  [name] Perry [[id number]]  Delivery Method: Vaginal, Spontaneous Delivery (Filed from Delivery Summary)   11/21/2016  Details of delivery can be found in separate delivery note.  Patient had a routine postpartum course. Patient received 3 days of IV antibiotics, sufficient treatment for her RLL CAP, was afebrile >24 hours before discharge and while on antibiotics. Despite having CHTN, patient's BPs remained normal postpartum without any medications. Sent home without antiHTN meds, discussed will have baby love follow up at home with BPs and to return to office in 1 week for a BP  check. Also discussed to stop DM meds, and to have fasting 2 hr GTT performed at postpartum follow up. Strict return precautions give, including to return to ED if develops fevers, SOB, etc. Patient is discharged home 11/26/16.  Physical exam  Vitals:   11/21/16 2057 11/22/16 0612 11/22/16 1804 11/23/16 0750  BP: 123/80 120/77 118/76 123/83  Pulse: 91 67 79 69  Resp: 18 18 18 18   Temp: 98.6 F (37 C) 97.4 F (36.3 C) 97.7 F (36.5 C) 97.7 F (36.5 C)  TempSrc: Oral Oral Oral Oral  Weight:      Height:       General: alert, cooperative and no distress Lochia: appropriate Uterine Fundus: firm Incision: N/A DVT Evaluation: No evidence of DVT seen on physical exam. Negative Homan's sign. No cords or calf tenderness. Labs: Lab Results  Component Value Date   WBC 7.3 11/22/2016   HGB 10.7 (L) 11/22/2016   HCT 30.7 (L) 11/22/2016   MCV 85.0 11/22/2016   PLT 133 (L) 11/22/2016   CMP  Latest Ref Rng & Units 11/22/2016  Glucose 65 - 99 mg/dL 865(H100(H)  BUN 6 - 20 mg/dL 20  Creatinine 8.460.44 - 9.621.00 mg/dL 9.520.88  Sodium 841135 - 324145 mmol/L 132(L)  Potassium 3.5 - 5.1 mmol/L 4.1  Chloride 101 - 111 mmol/L 104  CO2 22 - 32 mmol/L 20(L)  Calcium 8.9 - 10.3 mg/dL 4.0(N8.7(L)  Total Protein 6.5 - 8.1 g/dL 6.5  Total Bilirubin 0.3 - 1.2 mg/dL 0.2(V0.1(L)  Alkaline Phos 38 - 126 U/L 201(H)  AST 15 - 41 U/L 57(H)  ALT 14 - 54 U/L 47    Discharge instruction: per After Visit Summary and "Baby and Me Booklet".  After visit meds:  Allergies as of 11/23/2016   No Known Allergies     Medication List    STOP taking these medications   Calcium 200 MG Tabs   glyBURIDE 5 MG tablet Commonly known as:  DIABETA   labetalol 100 MG tablet Commonly known as:  NORMODYNE     TAKE these medications   folic acid 400 MCG tablet Commonly known as:  FOLVITE Take 400 mcg by mouth daily.   ibuprofen 600 MG tablet Commonly known as:  ADVIL,MOTRIN Take 1 tablet (600 mg total) by mouth every 6 (six) hours.    PRENATAL VITAMIN PO Take 1 tablet by mouth daily.       Diet: carb modified diet  Activity: Advance as tolerated. Pelvic rest for 6 weeks.   Outpatient follow up:1 week for BP check; 6 weeks for postpartum exam and IUD placement Follow up Appt:Future Appointments Date Time Provider Department Center  01/01/2017 1:20 PM Marny LowensteinJulie N Wenzel, PA-C [facility]   Follow up Visit:No Follow-up on file.  Postpartum contraception: IUD Mirena  Newborn Data: Live born female  Birth Weight: 9 lb 1.3 oz (4120 g) APGAR: 8, 9  Baby Feeding: Bottle and Breast Disposition:home with mother   11/23/2016 Jen MowElizabeth Chiron Campione, DO  OB Fellow

## 2016-11-23 NOTE — Progress Notes (Signed)
Interpreter [phone] used to introduce self to mother, explain plan of care, and ask for any further questions.

## 2016-11-24 ENCOUNTER — Inpatient Hospital Stay (HOSPITAL_COMMUNITY): Admit: 2016-11-24 | Payer: Medicaid Other

## 2016-11-24 LAB — RPR: RPR Ser Ql: NONREACTIVE

## 2016-11-26 LAB — CULTURE, BLOOD (ROUTINE X 2)
CULTURE: NO GROWTH
Culture: NO GROWTH

## 2017-01-01 ENCOUNTER — Ambulatory Visit: Payer: Self-pay | Admitting: Medical

## 2017-01-02 ENCOUNTER — Ambulatory Visit: Payer: Self-pay | Admitting: Obstetrics and Gynecology

## 2017-01-23 ENCOUNTER — Ambulatory Visit: Payer: Self-pay | Admitting: Advanced Practice Midwife

## 2017-01-29 ENCOUNTER — Other Ambulatory Visit: Payer: Medicaid Other

## 2017-02-05 ENCOUNTER — Telehealth: Payer: Self-pay | Admitting: Family Medicine

## 2017-02-05 NOTE — Telephone Encounter (Signed)
Patient to inform office that she was unable to keep past appointments due to husband recently being released from hospital and is in wheel chair. Patient stated that she will call us when she is able and ready to come to appointments. Informed patient of importance of calling and rescheduling, patient said that she will call.

## 2017-02-19 ENCOUNTER — Other Ambulatory Visit: Payer: Self-pay

## 2017-02-21 ENCOUNTER — Telehealth: Payer: Self-pay | Admitting: Internal Medicine

## 2017-02-21 NOTE — Telephone Encounter (Signed)
Patient Med was sent on 02/19/17 by Cherice

## 2017-02-27 ENCOUNTER — Other Ambulatory Visit: Payer: Self-pay

## 2017-02-27 DIAGNOSIS — O0992 Supervision of high risk pregnancy, unspecified, second trimester: Secondary | ICD-10-CM

## 2017-02-27 MED ORDER — LABETALOL HCL 100 MG PO TABS: ORAL_TABLET | ORAL | 11 refills | Status: DC

## 2017-05-01 ENCOUNTER — Ambulatory Visit (INDEPENDENT_AMBULATORY_CARE_PROVIDER_SITE_OTHER): Payer: Medicaid Other | Admitting: Physician Assistant

## 2017-05-02 ENCOUNTER — Encounter (HOSPITAL_COMMUNITY): Payer: Self-pay | Admitting: Emergency Medicine

## 2017-05-02 DIAGNOSIS — K8020 Calculus of gallbladder without cholecystitis without obstruction: Secondary | ICD-10-CM | POA: Insufficient documentation

## 2017-05-02 DIAGNOSIS — R7989 Other specified abnormal findings of blood chemistry: Secondary | ICD-10-CM | POA: Insufficient documentation

## 2017-05-02 DIAGNOSIS — Z79899 Other long term (current) drug therapy: Secondary | ICD-10-CM | POA: Insufficient documentation

## 2017-05-02 DIAGNOSIS — O24419 Gestational diabetes mellitus in pregnancy, unspecified control: Secondary | ICD-10-CM | POA: Insufficient documentation

## 2017-05-02 DIAGNOSIS — I10 Essential (primary) hypertension: Secondary | ICD-10-CM | POA: Insufficient documentation

## 2017-05-02 LAB — URINALYSIS, ROUTINE W REFLEX MICROSCOPIC
Bacteria, UA: NONE SEEN
Bilirubin Urine: NEGATIVE
Glucose, UA: NEGATIVE mg/dL
Hgb urine dipstick: NEGATIVE
Ketones, ur: NEGATIVE mg/dL
Leukocytes, UA: NEGATIVE
Nitrite: NEGATIVE
Protein, ur: 30 mg/dL — AB
Specific Gravity, Urine: 1.015 (ref 1.005–1.030)
Squamous Epithelial / HPF: NONE SEEN
pH: 6 (ref 5.0–8.0)

## 2017-05-02 LAB — CBC
HCT: 38.8 % (ref 36.0–46.0)
Hemoglobin: 12.7 g/dL (ref 12.0–15.0)
MCH: 28.7 pg (ref 26.0–34.0)
MCHC: 32.7 g/dL (ref 30.0–36.0)
MCV: 87.6 fL (ref 78.0–100.0)
Platelets: 266 10*3/uL (ref 150–400)
RBC: 4.43 MIL/uL (ref 3.87–5.11)
RDW: 13.7 % (ref 11.5–15.5)
WBC: 10.1 10*3/uL (ref 4.0–10.5)

## 2017-05-02 LAB — LIPASE, BLOOD: Lipase: 38 U/L (ref 11–51)

## 2017-05-02 LAB — COMPREHENSIVE METABOLIC PANEL
ALT: 165 U/L — ABNORMAL HIGH (ref 14–54)
ANION GAP: 9 (ref 5–15)
AST: 86 U/L — ABNORMAL HIGH (ref 15–41)
Albumin: 3.9 g/dL (ref 3.5–5.0)
Alkaline Phosphatase: 114 U/L (ref 38–126)
BUN: 15 mg/dL (ref 6–20)
CHLORIDE: 101 mmol/L (ref 101–111)
CO2: 26 mmol/L (ref 22–32)
Calcium: 9.4 mg/dL (ref 8.9–10.3)
Creatinine, Ser: 0.82 mg/dL (ref 0.44–1.00)
GFR calc non Af Amer: >60 mL/min (ref >60)
Glucose, Bld: 148 mg/dL — ABNORMAL HIGH (ref 65–99)
Potassium: 3.8 mmol/L (ref 3.5–5.1)
Sodium: 136 mmol/L (ref 135–145)
Total Bilirubin: 0.4 mg/dL (ref 0.3–1.2)
Total Protein: 7.6 g/dL (ref 6.5–8.1)

## 2017-05-02 LAB — I-STAT BETA HCG BLOOD, ED (MC, WL, AP ONLY)

## 2017-05-02 NOTE — ED Triage Notes (Signed)
Translator used: pt c/o abdominal pain, n/v, and headache beginning 1 hour PTA.

## 2017-05-03 ENCOUNTER — Emergency Department (HOSPITAL_COMMUNITY)
Admission: EM | Admit: 2017-05-03 | Discharge: 2017-05-03 | Disposition: A | Discharge status: Home / Self Care | Payer: Medicaid Other | Attending: Emergency Medicine | Admitting: Emergency Medicine

## 2017-05-03 ENCOUNTER — Emergency Department (HOSPITAL_COMMUNITY): Payer: Self-pay

## 2017-05-03 DIAGNOSIS — K8020 Calculus of gallbladder without cholecystitis without obstruction: Secondary | ICD-10-CM

## 2017-05-03 DIAGNOSIS — R1010 Upper abdominal pain, unspecified: Secondary | ICD-10-CM

## 2017-05-03 DIAGNOSIS — R945 Abnormal results of liver function studies: Secondary | ICD-10-CM

## 2017-05-03 DIAGNOSIS — R7989 Other specified abnormal findings of blood chemistry: Secondary | ICD-10-CM

## 2017-05-03 MED ORDER — ONDANSETRON 8 MG PO TBDP: 8.0000 mg | ORAL_TABLET | Freq: Three times a day (TID) | ORAL | 0 refills | Status: DC | PRN

## 2017-05-03 MED ORDER — ONDANSETRON HCL 4 MG/2ML IJ SOLN: 4.0000 mg | Freq: Once | INTRAMUSCULAR | Status: DC

## 2017-05-03 MED ORDER — MORPHINE SULFATE (PF) 4 MG/ML IV SOLN: 4.0000 mg | Freq: Once | INTRAVENOUS | Status: DC

## 2017-05-03 NOTE — ED Notes (Signed)
Patient transported to Ultrasound 

## 2017-05-03 NOTE — ED Notes (Signed)
Pt understood dc material. NAD noted. Scripts given and interpreter used

## 2017-05-03 NOTE — ED Provider Notes (Signed)
[facility]-EMERGENCY DEPT Provider Note   CSN: [id number] Arrival date & time: 05/02/17  2122     History   Chief Complaint Chief Complaint  Patient presents with  . Abdominal Pain  . Headache    HPI [name] Perry is a 41 y.o. female.  HPI Patient reports just a she had severe abdominal pain with associated nausea vomiting.  Denies diarrhea.  She has a history of gallbladder disease before in the past.  No prior cholecystectomy.  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  She denies weakness or paresthesias of her upper lower extremities.  She reports mild pain in her neck.  She reports the pain in her abdomen since resolved.  She has not vomited in the past several hours.  She did have some mild headache which started approximately 1 hour ago.   Past Medical History:  Diagnosis Date  . Anxiety   . Ectopic pregnancy march  . Gestational diabetes    glyburide  . Heart palpitations    anxiety related  . Hypertension 2010    Patient Active Problem List   Diagnosis Date Noted  . Normal spontaneous vaginal delivery 11/23/2016  . CAP (community acquired pneumonia) 11/23/2016  . Labor abnormal 11/22/2016  . NST (non-stress test) nonreactive 11/20/2016  . Language barrier 11/08/2016  . Gestational diabetes mellitus (GDM) affecting pregnancy 09/23/2016  . Abnormal quad screen 09/19/2016  . Supervision of high-risk pregnancy 07/24/2016  . History of gestational diabetes in prior pregnancy, currently pregnant in second trimester 07/24/2016  . Antepartum multigravida of advanced maternal age 71/23/2017  . Hypertension in pregnancy, essential, antepartum 09/27/2015  . AMA (advanced maternal age) multigravida 35+     Past Surgical History:  Procedure Laterality Date  . HERNIA REPAIR    . INSERTION OF MESH N/A 01/11/2014   Procedure: INSERTION OF MESH;  Surgeon: Axel Filler, MD;  Location: [facility] OR;  Service:  General;  Laterality: N/A;  . UMBILICAL HERNIA REPAIR N/A 01/11/2014   Procedure: LAPAROSCOPIC UMBILICAL HERNIA;  Surgeon: Axel Filler, MD;  Location: [facility] OR;  Service: General;  Laterality: N/A;    OB History    Gravida Para Term Preterm AB Living   6 4 4   2 4    SAB TAB Ectopic Multiple Live Births   1   1 0 4       Home Medications    Prior to Admission medications   Medication Sig Start Date End Date Taking? Authorizing Provider  labetalol (NORMODYNE) 100 MG tablet TAKE 1/2 TABLET BY MOUTH 2 TIMES DAILY 02/27/17  Yes Julieanne Manson, MD  ibuprofen (ADVIL,MOTRIN) 600 MG tablet Take 1 tablet (600 mg total) by mouth every 6 (six) hours. Patient not taking: Reported on 05/03/2017 11/22/16   Allie Bossier, MD  ondansetron (ZOFRAN ODT) 8 MG disintegrating tablet Take 1 tablet (8 mg total) by mouth every 8 (eight) hours as needed for nausea or vomiting. 05/03/17   Azalia Bilis, MD    Family History Family History  Problem Relation Age of Onset  . Cirrhosis Mother        Not alcoholic--not clear of etiology  . Ulcers Mother   . Alcohol abuse Father     Social History Social History  Substance Use Topics  . Smoking status: Never Smoker  . Smokeless tobacco: Never Used  . Alcohol use No     Allergies   Patient has no known allergies.  Review of Systems Review of Systems  All other systems reviewed and are negative.    Physical Exam Updated Vital Signs BP (!) 126/96   Pulse 87   Temp 98.1 F (36.7 C) (Oral)   Resp 16   Wt 74.8 kg (165 lb)   SpO2 96%   BMI 29.23 kg/m   Physical Exam  Constitutional: She is oriented to person, place, and time. She appears well-developed and well-nourished. No distress.  HENT:  Head: Normocephalic and atraumatic.  Eyes: EOM are normal.  Neck: Normal range of motion. Neck supple. No thyromegaly present.  No bruits noted bilaterally.  Cardiovascular: Normal rate and regular rhythm.   Pulmonary/Chest: Effort normal and  breath sounds normal. No stridor.  Abdominal: Soft. She exhibits no distension. There is no tenderness.  Musculoskeletal: Normal range of motion.  Lymphadenopathy:    She has no cervical adenopathy.  Neurological: She is alert and oriented to person, place, and time.  Skin: Skin is warm and dry.  Psychiatric: She has a normal mood and affect. Judgment normal.  Nursing note and vitals reviewed.    ED Treatments / Results  Labs (all labs ordered are listed, but only abnormal results are displayed) Labs Reviewed  COMPREHENSIVE METABOLIC PANEL - Abnormal; Notable for the following:       Result Value   Glucose, Bld 148 (*)    AST 86 (*)    ALT 165 (*)    All other components within normal limits  URINALYSIS, ROUTINE W REFLEX MICROSCOPIC - Abnormal; Notable for the following:    Protein, ur 30 (*)    All other components within normal limits  LIPASE, BLOOD  CBC  I-STAT BETA HCG BLOOD, ED ([facility], [facility], [facility] ONLY)    EKG  EKG Interpretation None       Radiology US Abdomen Limited Ruq  Result Date: 05/03/2017 CLINICAL DATA:  Acute onset of upper abdominal pain. Initial encounter. EXAM: ULTRASOUND ABDOMEN LIMITED RIGHT UPPER QUADRANT COMPARISON:  Abdominal ultrasound performed 07/19/2014 FINDINGS: Gallbladder: Stones within the gallbladder measure up to 2.1 cm in size. No gallbladder wall thickening or pericholecystic fluid is seen. No ultrasonographic Murphy's sign is elicited. Common bile duct: Diameter: 0.6 cm, within normal limits in caliber. Liver: No focal lesion identified. Diffusely increased parenchymal echogenicity and coarsened echotexture, compatible with fatty infiltration. IMPRESSION: 1. No acute abnormality seen at the right upper quadrant. 2. Cholelithiasis.  Gallbladder otherwise unremarkable. 3. Diffuse fatty infiltration within the liver. Electronically Signed   By: Roanna Raider M.D.   On: 05/03/2017 06:04    Procedures Procedures (including critical care  time)  Medications Ordered in ED Medications  ondansetron (ZOFRAN) injection 4 mg (4 mg Intravenous Refused 05/03/17 0700)  morphine 4 MG/ML injection 4 mg (4 mg Intravenous Refused 05/03/17 0700)     Initial Impression / Assessment and Plan / ED Course  I have reviewed the triage vital signs and the nursing notes.  Pertinent labs & imaging results that were available during my care of the patient were reviewed by me and considered in my medical decision making (see chart for details).     Patient with evidence of cholelithiasis.  Mild elevation in her LFTs.  I suspect yesterday she may have had a common bile duct stone this since resolved.  Bilirubin is normal.  Alkaline phosphatase is normal.  She currently is without a primary care physician.  I recommended that she return to the ER in 72 hours for recheck of her liver  function tests.  In the meantime she understands to return to the ER for new or worsening symptoms.  In regards to her neck pain is likely musculoskeletal from her vomiting yesterday.  No bruits noted.  Doubt carotid dissection.  Well-appearing.  Translator services utilized  Final Clinical Impressions(s) / ED Diagnoses   Final diagnoses:  Upper abdominal pain  Calculus of gallbladder without cholecystitis without obstruction  Elevated liver function tests    New Prescriptions Discharge Medication List as of 05/03/2017  6:52 AM    START taking these medications   Details  ondansetron (ZOFRAN ODT) 8 MG disintegrating tablet Take 1 tablet (8 mg total) by mouth every 8 (eight) hours as needed for nausea or vomiting., Starting Fri 05/03/2017, Print         Azalia Bilisampos, Jackqueline Aquilar, MD 05/03/17 [phone]

## 2017-05-03 NOTE — Discharge Instructions (Signed)
Please return to the emergency department on Monday for repeat LIVER BLOOD TESTS

## 2017-05-06 ENCOUNTER — Encounter (HOSPITAL_COMMUNITY): Payer: Self-pay | Admitting: Emergency Medicine

## 2017-05-06 ENCOUNTER — Emergency Department (HOSPITAL_COMMUNITY): Payer: Self-pay

## 2017-05-06 ENCOUNTER — Emergency Department (HOSPITAL_COMMUNITY)
Admission: EM | Admit: 2017-05-06 | Discharge: 2017-05-06 | Disposition: A | Discharge status: Home / Self Care | Payer: Self-pay | Attending: Emergency Medicine | Admitting: Emergency Medicine

## 2017-05-06 DIAGNOSIS — I10 Essential (primary) hypertension: Secondary | ICD-10-CM | POA: Insufficient documentation

## 2017-05-06 DIAGNOSIS — K8020 Calculus of gallbladder without cholecystitis without obstruction: Secondary | ICD-10-CM | POA: Insufficient documentation

## 2017-05-06 DIAGNOSIS — K760 Fatty (change of) liver, not elsewhere classified: Secondary | ICD-10-CM | POA: Insufficient documentation

## 2017-05-06 DIAGNOSIS — R740 Nonspecific elevation of levels of transaminase and lactic acid dehydrogenase [LDH]: Secondary | ICD-10-CM | POA: Insufficient documentation

## 2017-05-06 DIAGNOSIS — R7401 Elevation of levels of liver transaminase levels: Secondary | ICD-10-CM

## 2017-05-06 DIAGNOSIS — Z79899 Other long term (current) drug therapy: Secondary | ICD-10-CM | POA: Insufficient documentation

## 2017-05-06 LAB — CBC WITH DIFFERENTIAL/PLATELET
BASOS PCT: 0 %
Basophils Absolute: 0 10*3/uL (ref 0.0–0.1)
EOS PCT: 4 %
Eosinophils Absolute: 0.3 10*3/uL (ref 0.0–0.7)
HCT: 37.7 % (ref 36.0–46.0)
HEMOGLOBIN: 12.8 g/dL (ref 12.0–15.0)
Lymphocytes Relative: 41 %
Lymphs Abs: 3.1 10*3/uL (ref 0.7–4.0)
MCH: 29.4 pg (ref 26.0–34.0)
MCHC: 34 g/dL (ref 30.0–36.0)
MCV: 86.5 fL (ref 78.0–100.0)
MONOS PCT: 5 %
Monocytes Absolute: 0.4 10*3/uL (ref 0.1–1.0)
NEUTROS PCT: 50 %
Neutro Abs: 3.7 10*3/uL (ref 1.7–7.7)
PLATELETS: 290 10*3/uL (ref 150–400)
RBC: 4.36 MIL/uL (ref 3.87–5.11)
RDW: 13.3 % (ref 11.5–15.5)
WBC: 7.5 10*3/uL (ref 4.0–10.5)

## 2017-05-06 LAB — COMPREHENSIVE METABOLIC PANEL
ALK PHOS: 82 U/L (ref 38–126)
ALT: 155 U/L — AB (ref 14–54)
AST: 87 U/L — AB (ref 15–41)
Albumin: 3.9 g/dL (ref 3.5–5.0)
Anion gap: 7 (ref 5–15)
BUN: 13 mg/dL (ref 6–20)
CHLORIDE: 104 mmol/L (ref 101–111)
CO2: 28 mmol/L (ref 22–32)
CREATININE: 0.72 mg/dL (ref 0.44–1.00)
Calcium: 9.2 mg/dL (ref 8.9–10.3)
GFR calc Af Amer: >60 mL/min (ref >60)
GFR calc non Af Amer: >60 mL/min (ref >60)
Glucose, Bld: 103 mg/dL — ABNORMAL HIGH (ref 65–99)
Potassium: 3.9 mmol/L (ref 3.5–5.1)
SODIUM: 139 mmol/L (ref 135–145)
Total Bilirubin: 0.7 mg/dL (ref 0.3–1.2)
Total Protein: 7.8 g/dL (ref 6.5–8.1)

## 2017-05-06 LAB — LIPASE, BLOOD: Lipase: 33 U/L (ref 11–51)

## 2017-05-06 MED ORDER — IBUPROFEN 600 MG PO TABS: 600.0000 mg | ORAL_TABLET | Freq: Four times a day (QID) | ORAL | 0 refills | Status: DC | PRN

## 2017-05-06 NOTE — ED Provider Notes (Signed)
[facility]-EMERGENCY DEPT Provider Note   CSN: [id number] Arrival date & time: 05/06/17  0750     History   Chief Complaint Chief Complaint  Patient presents with  . Abdominal Pain    HPI [name] Perry is a 41 y.o. female.  HPI   41 yo F with PMHx HTN, gallstones here with abdominal pain. Pt was just seen on 8/10 for RUQ pain, found to have mild elevation in LFTs and gallstones but no cholecystitis. She felt improved and was told to return in 72 hours or earlier for repeat labs. Pt states her abd pain has improved since then. She continues to feel mild RUQ pain with eating but no vomiting, no fevers. She also c/o aching, throbbing left neck pain that began after vomiting on 8/10, but states this is also improving. No diarrhea. No change in stool colors. No alleviating factors.   Past Medical History:  Diagnosis Date  . Anxiety   . Ectopic pregnancy march  . Gestational diabetes    glyburide  . Heart palpitations    anxiety related  . Hypertension 2010    Patient Active Problem List   Diagnosis Date Noted  . Normal spontaneous vaginal delivery 11/23/2016  . CAP (community acquired pneumonia) 11/23/2016  . Labor abnormal 11/22/2016  . NST (non-stress test) nonreactive 11/20/2016  . Language barrier 11/08/2016  . Gestational diabetes mellitus (GDM) affecting pregnancy 09/23/2016  . Abnormal quad screen 09/19/2016  . Supervision of high-risk pregnancy 07/24/2016  . History of gestational diabetes in prior pregnancy, currently pregnant in second trimester 07/24/2016  . Antepartum multigravida of advanced maternal age 71/23/2017  . Hypertension in pregnancy, essential, antepartum 09/27/2015  . AMA (advanced maternal age) multigravida 35+     Past Surgical History:  Procedure Laterality Date  . HERNIA REPAIR    . INSERTION OF MESH N/A 01/11/2014   Procedure: INSERTION OF MESH;  Surgeon: Axel Filler, MD;  Location: [facility] OR;  Service: General;  Laterality: N/A;  .  UMBILICAL HERNIA REPAIR N/A 01/11/2014   Procedure: LAPAROSCOPIC UMBILICAL HERNIA;  Surgeon: Axel Filler, MD;  Location: [facility] OR;  Service: General;  Laterality: N/A;    OB History    Gravida Para Term Preterm AB Living   6 4 4   2 4    SAB TAB Ectopic Multiple Live Births   1   1 0 4       Home Medications    Prior to Admission medications   Medication Sig Start Date End Date Taking? Authorizing Provider  labetalol (NORMODYNE) 100 MG tablet TAKE 1/2 TABLET BY MOUTH 2 TIMES DAILY Patient taking differently: Take 50 mg by mouth 2 (two) times daily.  02/27/17  Yes Julieanne Manson, MD  ondansetron (ZOFRAN ODT) 8 MG disintegrating tablet Take 1 tablet (8 mg total) by mouth every 8 (eight) hours as needed for nausea or vomiting. 05/03/17  Yes Azalia Bilis, MD  ibuprofen (ADVIL,MOTRIN) 600 MG tablet Take 1 tablet (600 mg total) by mouth every 6 (six) hours as needed for moderate pain. 05/06/17   Shaune Pollack, MD    Family History Family History  Problem Relation Age of Onset  . Cirrhosis Mother        Not alcoholic--not clear of etiology  . Ulcers Mother   . Alcohol abuse Father     Social History Social History  Substance Use Topics  . Smoking status: Never Smoker  . Smokeless tobacco: Never Used  . Alcohol use No     Allergies  Patient has no known allergies.   Review of Systems Review of Systems  Constitutional: Positive for fatigue. Negative for chills and fever.  HENT: Negative for congestion and rhinorrhea.   Eyes: Negative for visual disturbance.  Respiratory: Negative for cough, shortness of breath and wheezing.   Cardiovascular: Negative for chest pain and leg swelling.  Gastrointestinal: Positive for abdominal pain and nausea. Negative for diarrhea and vomiting.  Genitourinary: Negative for dysuria and flank pain.  Musculoskeletal: Positive for neck pain. Negative for neck stiffness.  Skin: Negative for rash and wound.  Allergic/Immunologic: Negative  for immunocompromised state.  Neurological: Negative for syncope, weakness and headaches.  All other systems reviewed and are negative.    Physical Exam Updated Vital Signs BP (!) 132/91 (BP Location: Left Arm)   Pulse 62   Temp 98.4 F (36.9 C) (Oral)   Resp 16   Ht 5\' 5"  (1.651 m)   Wt 74.8 kg (165 lb)   SpO2 99%   BMI 27.46 kg/m   Physical Exam  Constitutional: She is oriented to person, place, and time. She appears well-developed and well-nourished. No distress.  HENT:  Head: Normocephalic and atraumatic.  Eyes: Conjunctivae are normal.  Neck: Neck supple.  Cardiovascular: Normal rate, regular rhythm and normal heart sounds.  Exam reveals no friction rub.   No murmur heard. Pulmonary/Chest: Effort normal and breath sounds normal. No respiratory distress. She has no wheezes. She has no rales.  Abdominal: She exhibits no distension. There is tenderness (mild) in the right upper quadrant. There is no rigidity, no rebound, no guarding, no CVA tenderness and negative Murphy's sign.  Musculoskeletal: She exhibits no edema.  Neurological: She is alert and oriented to person, place, and time. She exhibits normal muscle tone.  Skin: Skin is warm. Capillary refill takes less than 2 seconds.  Psychiatric: She has a normal mood and affect.  Nursing note and vitals reviewed.    ED Treatments / Results  Labs (all labs ordered are listed, but only abnormal results are displayed) Labs Reviewed  COMPREHENSIVE METABOLIC PANEL - Abnormal; Notable for the following:       Result Value   Glucose, Bld 103 (*)    AST 87 (*)    ALT 155 (*)    All other components within normal limits  CBC WITH DIFFERENTIAL/PLATELET  LIPASE, BLOOD    EKG  EKG Interpretation None       Radiology US Abdomen Limited Ruq  Result Date: 05/06/2017 CLINICAL DATA:  41 year old hypertensive female with right upper quadrant pain 5 days. Initial encounter. EXAM: ULTRASOUND ABDOMEN LIMITED RIGHT UPPER  QUADRANT COMPARISON:  05/03/2017, 07/19/2014 and 08/27/2008 ultrasound. FINDINGS: Gallbladder: Three mobile gallstones measuring up to 2.3 cm. No gallbladder wall thickening. No discrete pericholecystic fluid. Patient was not tender over the gallbladder during scanning per ultrasound technologist. Common bile duct: Diameter: 2.6 mm Liver: Diffuse increased echogenicity most suggestive of fatty infiltration with focal fatty sparing adjacent to the gallbladder. No dominant hepatic lesion or intrahepatic biliary duct dilatation. Hepatopetal flow within the main portal vein. IMPRESSION: Gallstones without sonographic evidence of cholecystitis. If further delineation is clinically desired, nuclear medicine exam could be performed. Fatty liver with focal fatty sparing adjacent to the gallbladder. Electronically Signed   By: Lacy Duverney M.D.   On: 05/06/2017 11:26    Procedures Procedures (including critical care time)  Medications Ordered in ED Medications - No data to display   Initial Impression / Assessment and Plan / ED Course  I  have reviewed the triage vital signs and the nursing notes.  Pertinent labs & imaging results that were available during my care of the patient were reviewed by me and considered in my medical decision making (see chart for details).     41 yo Hispanic female here for re-check of U/S and labs s/p recent diagnosis of possible symptomatic cholelithiasis. On exam today, tp has minimal RUQ TTP, no Murphy's. Lab work is unchanged, but normal AlkP, normal Bili making acute chole unlikely and her pain has dramatically improved. Her WBC is also normal. Normal lipase. Repeat U/S obtained and shows no evidence of CBD or cholecystitis, though fatty liver is noted and may explain her elevated LFTs. Will refer her to a GI specialist for her LFTs and CCS for her gallbladder. Given absence of any clinical or exam evidence of acute chole at this time, do not feel that emergent intervention  is required. Will advise to avoid APAP, tylenol.  Final Clinical Impressions(s) / ED Diagnoses   Final diagnoses:  Gallstones  Fatty liver disease, nonalcoholic  Transaminitis    New Prescriptions New Prescriptions   IBUPROFEN (ADVIL,MOTRIN) 600 MG TABLET    Take 1 tablet (600 mg total) by mouth every 6 (six) hours as needed for moderate pain.     Shaune PollackIsaacs, Shone Leventhal, MD 05/06/17 [phone]

## 2017-05-06 NOTE — ED Notes (Signed)
Patient transported to Ultrasound 

## 2017-05-06 NOTE — Discharge Instructions (Signed)
-   Do not take any TYLENOL, or ACETAMINOPHEN - Do not drink ALCOHOL

## 2017-05-06 NOTE — ED Triage Notes (Signed)
Pt was here last week for abd pain and gallbladder issues; pt sts told to return to ED for ultrasound today

## 2017-05-23 ENCOUNTER — Ambulatory Visit (INDEPENDENT_AMBULATORY_CARE_PROVIDER_SITE_OTHER): Payer: Medicaid Other | Admitting: Physician Assistant

## 2017-10-18 IMAGING — US US MFM FETAL BPP W/O NON-STRESS
1 series · 13 of 22 positions shown · non-contrast
Comparison: none

[Series 1: us mfm fetal bpp w/o non-stress · 22 acquisitions, 13 frames shown]
[im 1/22]
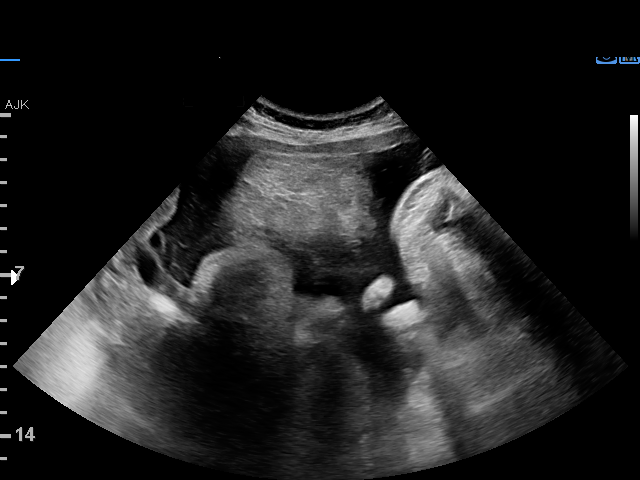
[im 3/22]
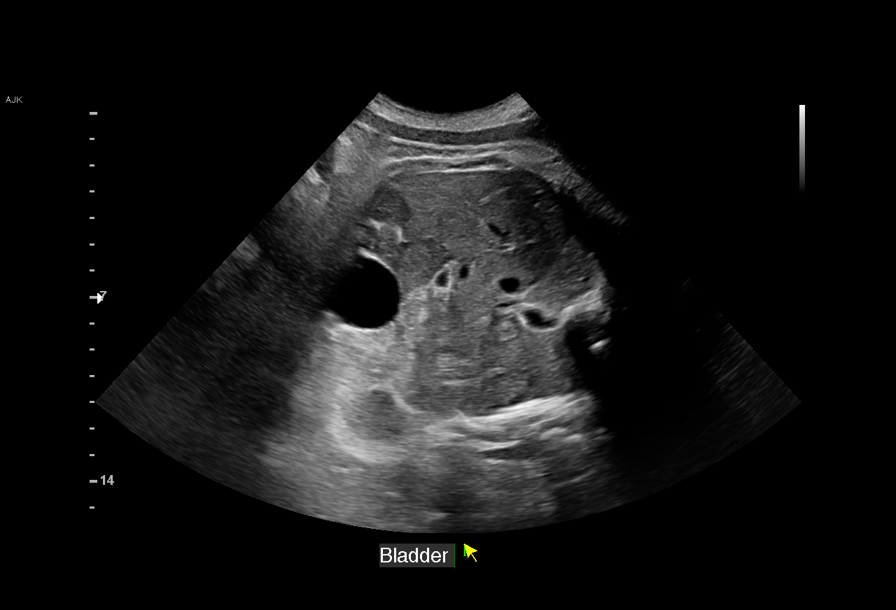
[im 5/22]
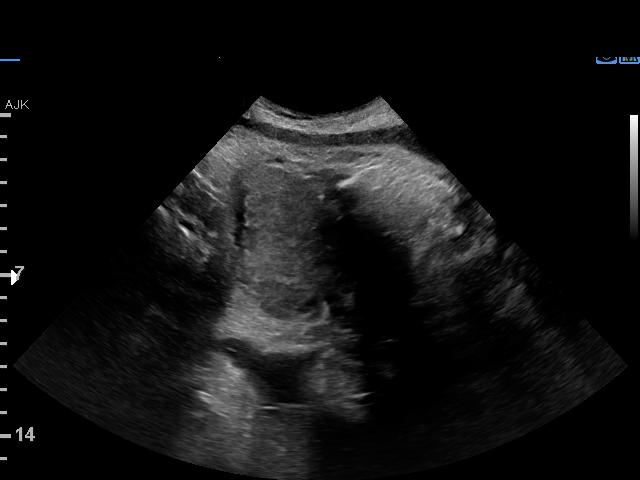
[im 6/22]
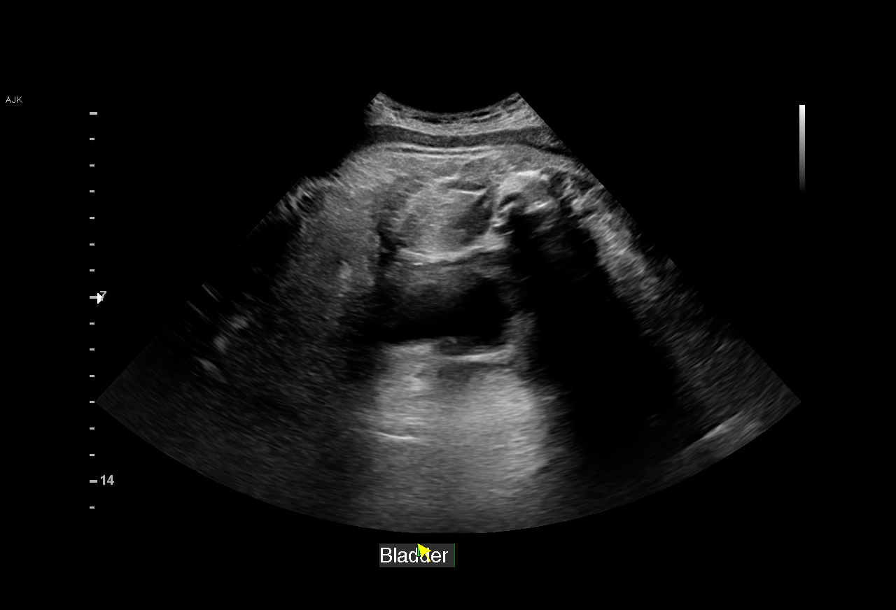
[im 8/22]
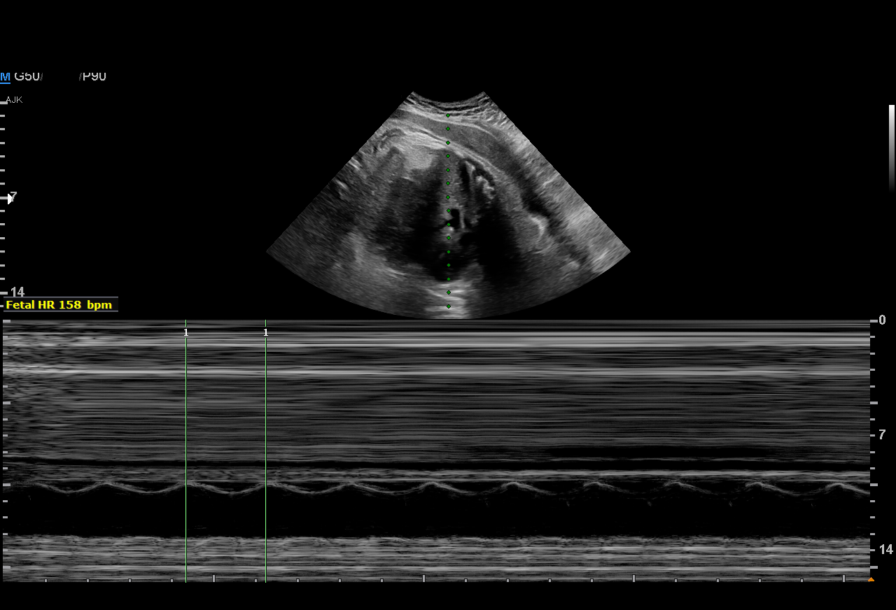
[im 10/22]
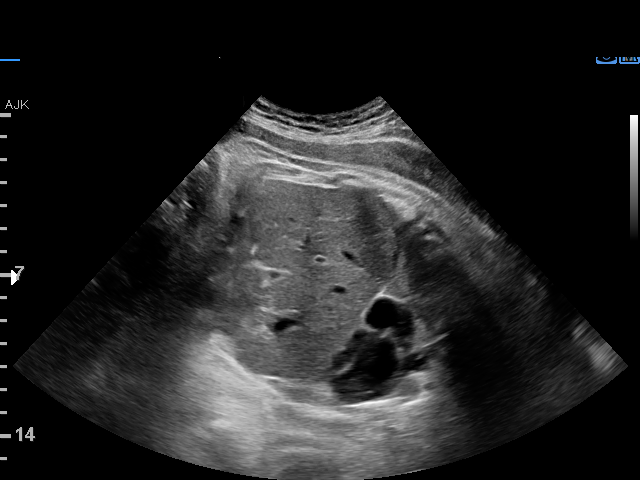
[im 12/22]
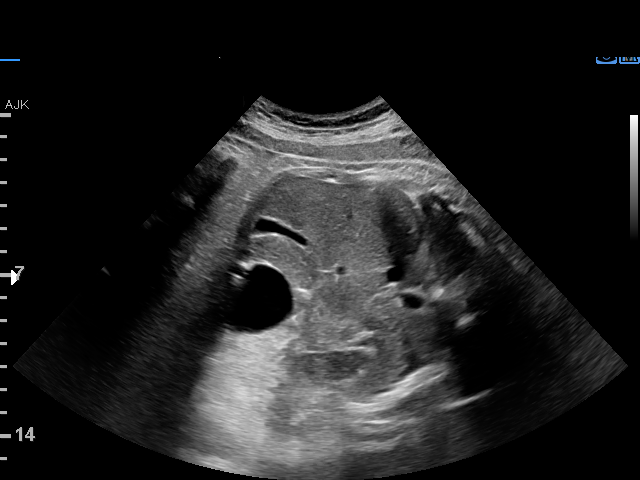
[im 13/22]
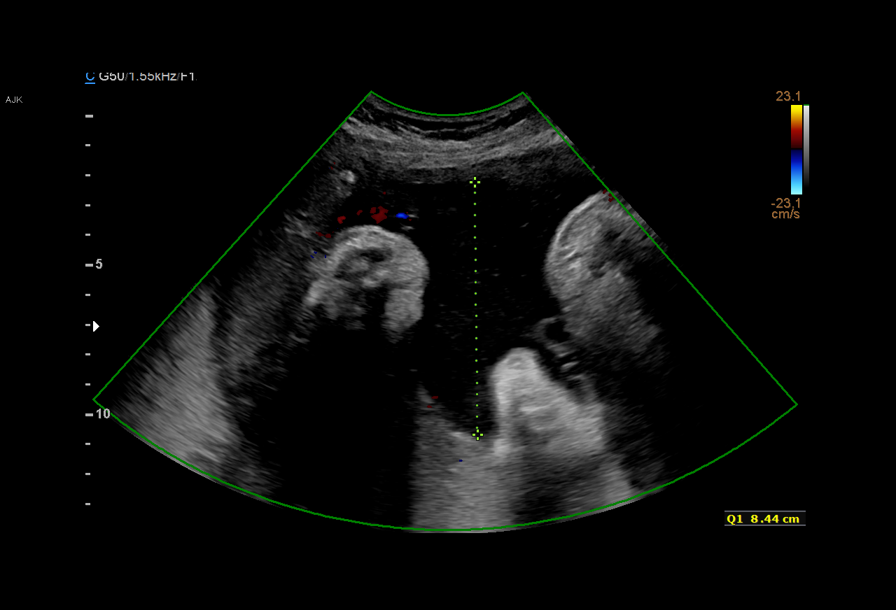
[im 15/22]
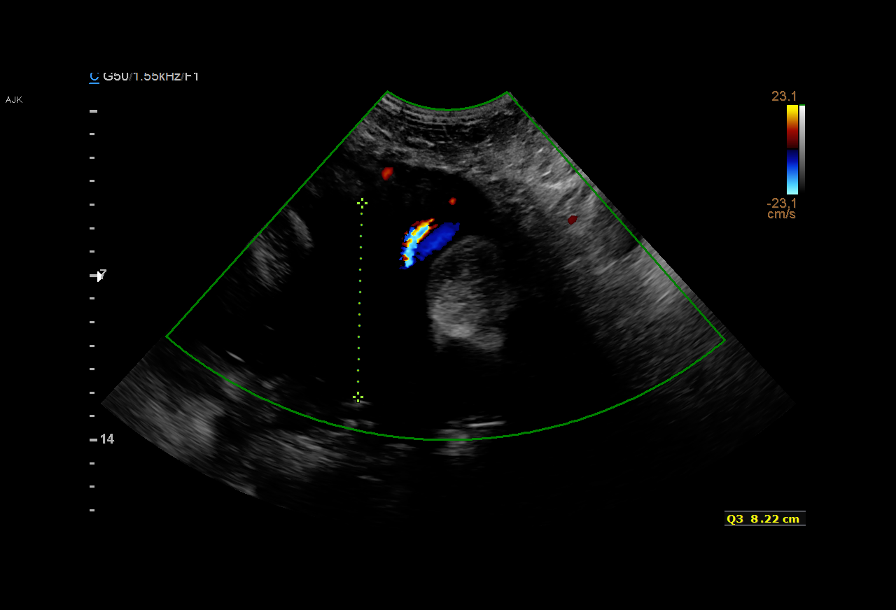
[im 17/22]
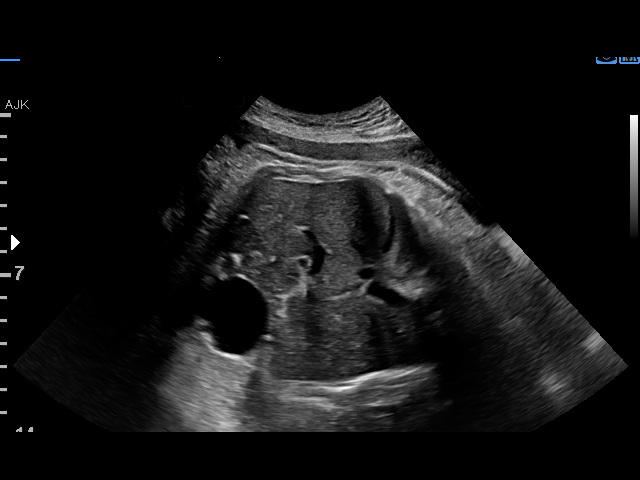
[im 18/22]
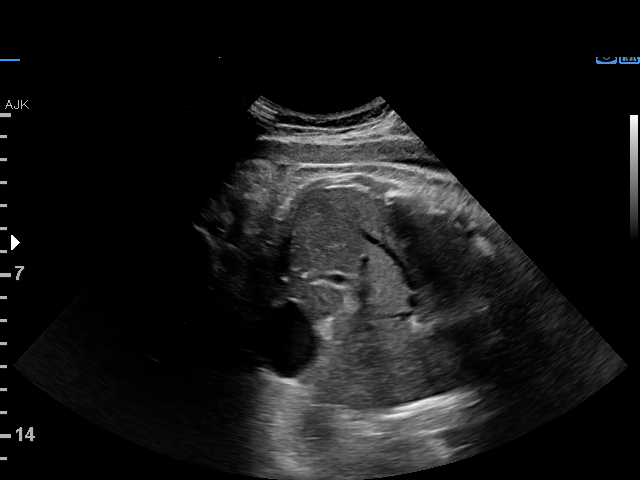
[im 20/22]
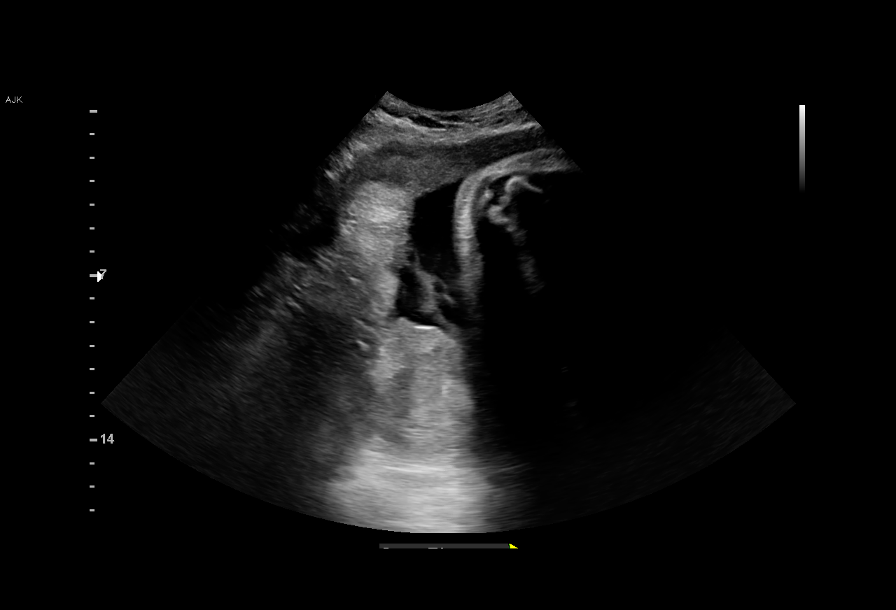
[im 22/22]
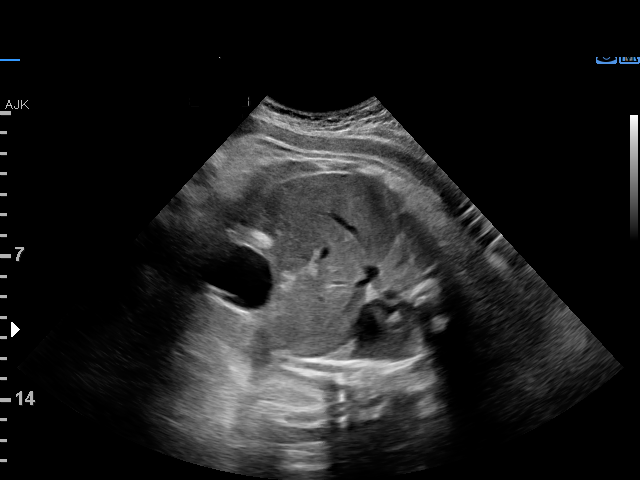

[13 of 22 positions shown; findings below may reference images not displayed]

[facility] [[facility]OS[facility]]
[facility]

Indications

38 weeks gestation of pregnancy
Non-reactive NST
Advanced maternal age multigravida (40),
third trimester -declined further testing
Abnormal biochemical screen (quad) for
Trisomy 18 ([DATE])
Poor obstetric history: Previous gestational
diabetes
Hypertension - Chronic/Pre-existing -labetalol
Gestational diabetes in pregnancy,
controlled by oral hypoglycemic drugs
(glyburide)
OB History

Gravidity:    6         Term:   3        Prem:   0         SAB:   1
TOP:          0       Ectopic:  1        Living: 3
Fetal Evaluation

Num Of Fetuses:     1
Fetal Heart         158
Rate(bpm):
Cardiac Activity:   Observed
Presentation:       Cephalic
Placenta:           Posterior
P. Cord Insertion:  Previously Visualized

Amniotic Fluid
AFI FV:      Subjectively within normal limits

AFI Sum(cm)     %Tile       Largest Pocket(cm)
22.69           91

RUQ(cm)       RLQ(cm)       LUQ(cm)        LLQ(cm)
8.44
Biophysical Evaluation

Amniotic F.V:   Within normal limits       F. Tone:         Observed
F. Movement:    Observed                   Score:           [DATE]
F. Breathing:   Observed
Gestational Age

LMP:           39w 5d        Date:  02/16/16                 EDD:    11/22/16
Best:          38w 2d     Det. By:  U/S  (07/16/16)          EDD:    12/02/16
Impression

SIUP at 38+2 weeks
Cephalic presentation
Normal amniotic fluid volume
BPP [DATE]
Recommendations

Follow-up as clinically indicated

## 2018-03-11 ENCOUNTER — Other Ambulatory Visit: Payer: Self-pay | Admitting: Internal Medicine

## 2018-03-13 ENCOUNTER — Ambulatory Visit: Payer: Self-pay | Admitting: Internal Medicine

## 2018-04-17 ENCOUNTER — Encounter (INDEPENDENT_AMBULATORY_CARE_PROVIDER_SITE_OTHER): Payer: Self-pay | Admitting: Physician Assistant

## 2018-04-17 ENCOUNTER — Ambulatory Visit (INDEPENDENT_AMBULATORY_CARE_PROVIDER_SITE_OTHER): Payer: Self-pay | Admitting: Physician Assistant

## 2018-04-17 VITALS — BP 125/80 | HR 74 | Temp 97.6°F | Ht 65.0 in | Wt 142.0 lb

## 2018-04-17 DIAGNOSIS — R81 Glycosuria: Secondary | ICD-10-CM

## 2018-04-17 DIAGNOSIS — E119 Type 2 diabetes mellitus without complications: Secondary | ICD-10-CM

## 2018-04-17 DIAGNOSIS — H11152 Pinguecula, left eye: Secondary | ICD-10-CM

## 2018-04-17 LAB — POCT URINALYSIS DIPSTICK
Blood, UA: NEGATIVE
Glucose, UA: POSITIVE — AB
Leukocytes, UA: NEGATIVE
NITRITE UA: NEGATIVE
PH UA: 5.5 (ref 5.0–8.0)
PROTEIN UA: POSITIVE — AB
SPEC GRAV UA: 1.025 (ref 1.010–1.025)
UROBILINOGEN UA: 0.2 U/dL

## 2018-04-17 LAB — GLUCOSE, POCT (MANUAL RESULT ENTRY): POC Glucose: 299 mg/dl — AB (ref 70–99)

## 2018-04-17 LAB — POCT GLYCOSYLATED HEMOGLOBIN (HGB A1C): Hemoglobin A1C: 13.3 % — AB (ref 4.0–5.6)

## 2018-04-17 MED ORDER — "INSULIN SYRINGE 31G X 5/16"" 1 ML MISC": 1.0000 | Freq: Every day | 5 refills | Status: DC

## 2018-04-17 MED ORDER — INSULIN GLARGINE 100 UNIT/ML ~~LOC~~ SOLN
15.0000 [IU] | Freq: Every day | SUBCUTANEOUS | 1 refills | Status: DC
Start: 2018-04-17 — End: 2018-08-11

## 2018-04-17 MED ORDER — METFORMIN HCL ER 500 MG PO TB24: 1000.0000 mg | ORAL_TABLET | Freq: Every day | ORAL | 11 refills | Status: DC

## 2018-04-17 MED ORDER — BLOOD GLUCOSE MONITOR KIT: PACK | 0 refills | Status: AC

## 2018-04-17 MED FILL — TRUEplus LANCETS 28G MISC: 25 days supply | Qty: 100 | Fill #0

## 2018-04-17 MED FILL — TRUE METRIX TEST STRIP: 25 days supply | Qty: 100 | Fill #0

## 2018-04-17 MED FILL — METFORMIN HCL ER 500 MG TAB: 500 | 30 days supply | Qty: 60 | Fill #0

## 2018-04-17 MED FILL — TRUEPLUS SYR 0.5ML 31GX5/16: 31G X 5/16" | 25 days supply | Qty: 100 | Fill #0

## 2018-04-17 MED FILL — !LANTUS 100 UNITS/ML VIAL: 100 | 28 days supply | Qty: 10 | Fill #0

## 2018-04-17 MED FILL — !TRUE METRIX BLOOD GLUCOSE: 365 days supply | Qty: 1 | Fill #0

## 2018-04-17 NOTE — Progress Notes (Signed)
Subjective:  Patient ID: [name] Perry, female    DOB: 01-Jan-1976  Age: 42 y.o. MRN: [id number]  CC: Abnormal   HPI Snigdha Howser is a 42 y.o. female with a medical history of anxiety, HTN, ectopic pregnancy, and gestational diabetes presents as a new patient to discuss abnormal [facility]. Was found to have glucose >1000 and Ketones. States she had RLQ abdominal pain but was resolved after taking fluconazole.  Does not endorse polydipsia, polyuria, visual blurring, or fatigue. Denies CP, palpitations, SOB, HA, tingling, numbness, rash, f/c/n/v, edema, or GI/GU sxs.      Outpatient Medications Prior to Visit  Medication Sig Dispense Refill  . labetalol (NORMODYNE) 100 MG tablet TAKE 1/2 TABLET BY MOUTH 2 TIMES DAILY (Patient not taking: Reported on 04/17/2018) 30 tablet 1  . ibuprofen (ADVIL,MOTRIN) 600 MG tablet Take 1 tablet (600 mg total) by mouth every 6 (six) hours as needed for moderate pain. 30 tablet 0  . ondansetron (ZOFRAN ODT) 8 MG disintegrating tablet Take 1 tablet (8 mg total) by mouth every 8 (eight) hours as needed for nausea or vomiting. 10 tablet 0   No facility-administered medications prior to visit.      ROS Review of Systems  Constitutional: Negative for chills, fever and malaise/fatigue.  Eyes: Negative for blurred vision.  Respiratory: Negative for shortness of breath.   Cardiovascular: Negative for chest pain, palpitations and leg swelling.  Gastrointestinal: Negative for abdominal pain, blood in stool, constipation, diarrhea, nausea and vomiting.  Genitourinary: Negative for dysuria and hematuria.  Musculoskeletal: Negative for joint pain and myalgias.  Skin: Negative for rash.  Neurological: Negative for tingling and headaches.  Psychiatric/Behavioral: Negative for depression. The patient is not nervous/anxious.     Objective:  Ht '5\' 5"'$  (1.651 m)   Wt 142 lb (64.4 kg)   LMP 02/23/2018 (Approximate)    Breastfeeding? Yes   BMI 23.63 kg/m   Vitals:   04/17/18 0832  BP: 125/80  Pulse: 74  Temp: 97.6 F (36.4 C)  TempSrc: Oral  SpO2: 96%  Weight: 142 lb (64.4 kg)  Height: '5\' 5"'$  (1.651 m)      Physical Exam  Constitutional: She is oriented to person, place, and time.  Well developed, well nourished, NAD, polite  HENT:  Head: Normocephalic and atraumatic.  No oral thrush  Eyes: No scleral icterus.  Pinguecula of left eye  Neck: Normal range of motion. Neck supple. No thyromegaly present.  Cardiovascular: Normal rate, regular rhythm and normal heart sounds.  No LE edema bilaterally  Pulmonary/Chest: Effort normal and breath sounds normal.  Abdominal: Soft. Bowel sounds are normal. There is no tenderness.  Musculoskeletal: She exhibits no edema.  Neurological: She is alert and oriented to person, place, and time.  Skin: Skin is warm and dry. No rash noted. No erythema. No pallor.  Psychiatric: She has a normal mood and affect. Her behavior is normal. Thought content normal.  Vitals reviewed.    Assessment & Plan:    1. Glycosuria - Urinalysis Dipstick glucose and ketones. No symptomatology of DKA. - Glucose (CBG) 299 - HgB A1c 13.3%  2. Type 2 diabetes mellitus without complication, without long-term current use of insulin (HCC) - Lipid panel - Comprehensive metabolic panel - CBC with Differential - TSH  3. Pinguecula of left eye - Advised to enroll in [facility] charity program for removal.   Meds ordered this encounter  Medications  . metFORMIN (GLUCOPHAGE XR) 500 MG 24 hr tablet  Sig: Take 2 tablets (1,000 mg total) by mouth daily with breakfast.    Dispense:  60 tablet    Refill:  11    Order Specific Question:   Supervising Provider    Answer:   Charlott Rakes [[id number]]  . insulin glargine (LANTUS) 100 UNIT/ML injection    Sig: Inject 0.15 mLs (15 Units total) into the skin daily.    Dispense:  10 mL    Refill:  1    Order Specific Question:    Supervising Provider    Answer:   Charlott Rakes [[id number]]  . Insulin Syringe-Needle U-100 (INSULIN SYRINGE 1CC/31GX5/16") 31G X 5/16" 1 ML MISC    Sig: Inject 1 each into the skin daily.    Dispense:  30 each    Refill:  5    Order Specific Question:   Supervising Provider    Answer:   Charlott Rakes [[id number]]  . blood glucose meter kit and supplies KIT    Sig: Dispense based on patient and insurance preference. Use up to four times daily as directed. (FOR ICD-9 250.00, 250.01).    Dispense:  1 each    Refill:  0    Any glucometer that is available for free or low cost.  ICD10- E11.10    Order Specific Question:   Number of strips    Answer:   100    Order Specific Question:   Number of lancets    Answer:   100    Order Specific Question:   Supervising Provider    Answer:   Charlott Rakes [[id number]]    Follow-up: Return in about 1 month (around 05/15/2018) for Glucose log.   Clent Demark PA

## 2018-04-17 NOTE — Patient Instructions (Signed)
Diabetes mellitus y nutrición  Diabetes Mellitus and Nutrition  Si sufre de diabetes (diabetes mellitus), es muy importante tener hábitos alimenticios saludables debido a que sus niveles de azúcar en la sangre (glucosa) se ven afectados en gran medida por lo que come y bebe. Comer alimentos saludables en las cantidades adecuadas, aproximadamente a la misma hora todos los días, lo ayudará a:  · Controlar la glucemia.  · Disminuir el riesgo de sufrir una enfermedad cardíaca.  · Mejorar la presión arterial.  · Alcanzar o mantener un peso saludable.    Todas las personas que sufren de diabetes son diferentes y cada una tiene necesidades diferentes en cuanto a un plan de alimentación. El médico puede recomendarle que trabaje con un especialista en dietas y nutrición (nutricionista) para elaborar el mejor plan para usted. Su plan de alimentación puede variar según factores como:  · Las calorías que necesita.  · Los medicamentos que toma.  · Su peso.  · Sus niveles de glucemia, presión arterial y colesterol.  · Su nivel de actividad.  · Otras afecciones que tenga, como enfermedades cardíacas o renales.    ¿Cómo me afectan los carbohidratos?  Los carbohidratos afectan el nivel de glucemia más que cualquier otro tipo de alimento. La ingesta de carbohidratos naturalmente aumenta la cantidad glucosa en la sangre. El recuento de carbohidratos es un método destinado a llevar un registro de la cantidad de carbohidratos que se ingieren. El recuento de carbohidratos es importante para mantener la glucemia a un nivel saludable, en especial si utiliza insulina o toma determinados medicamentos por vía oral para la diabetes.  Es importante saber la cantidad de carbohidratos que se pueden ingerir en cada comida sin correr ningún riesgo. Esto es diferente en cada persona. El nutricionista puede ayudarlo a calcular la cantidad de carbohidratos que debe ingerir en cada comida y colación.   Los alimentos que contienen carbohidratos incluyen:  · Pan, cereal, arroz, pasta y galletas.  · Papas y maíz.  · Guisantes, frijoles y lentejas.  · Leche y yogur.  · Frutas y jugo.  · Postres, como pasteles, galletitas, helado y caramelos.    ¿Cómo me afecta el alcohol?  El alcohol puede provocar disminuciones súbitas de la glucemia (hipoglucemia), en especial si utiliza insulina o toma determinados medicamentos por vía oral para la diabetes. La hipoglucemia es una afección potencialmente mortal. Los síntomas de la hipoglucemia (somnolencia, mareos y confusión) son similares a los síntomas de haber consumido demasiado alcohol.  Si el médico afirma que el alcohol es seguro para usted, siga estas pautas:  · Limite el consumo de alcohol a no más de 1 medida por día si es mujer y no está embarazada, y a 2 medidas si es hombre. Una medida equivale a 12 oz (355 ml) de cerveza, 5 oz (148 ml) de vino o 1½ oz (44 ml) de bebidas de alta graduación alcohólica.  · No beba con el estómago vacío.  · Manténgase hidratado con agua, gaseosas dietéticas o té helado sin azúcar.  · Tenga en cuenta que las gaseosas comunes, los jugos y otros refrescos pueden contener mucha azúcar y se deben contar como carbohidratos.    Consejos para seguir este plan  Leer las etiquetas de los alimentos  · Comience por controlar el tamaño de la porción en la etiqueta. La cantidad de calorías, carbohidratos, grasas y otros nutrientes mencionados en la etiqueta se basan en una porción del alimento. Muchos alimentos contienen más de una porción por envase.  · Verifique la cantidad total de gramos (g)   de carbohidratos totales en una porción. Puede calcular la cantidad de porciones de carbohidratos al dividir el total de carbohidratos por 15. Por ejemplo, si un alimento posee un total de 30 g de carbohidratos, equivale a 2 porciones de carbohidratos.  · Verifique la cantidad de gramos (g) de grasas saturadas y grasas trans  en una porción. Escoja alimentos que no contengan grasa o que tengan un bajo contenido.  · Controle la cantidad de miligramos (mg) de sodio en una porción. La mayoría de las personas deben limitar la ingesta de sodio total a menos de 2300 mg por día.  · Siempre consulte la información nutricional de los alimentos etiquetados como “con bajo contenido de grasa” o “sin grasa”. Estos alimentos pueden ser más altos en azúcar agregada o en carbohidratos refinados y deben evitarse.  · Hable con el nutricionista para identificar sus objetivos diarios en cuanto a los nutrientes mencionados en la etiqueta.  De compras  · Evite comprar alimentos procesados, enlatados o prehechos. Estos alimentos tienden a tener mayor cantidad de grasa, sodio y azúcar agregada.  · Compre en la zona exterior de la tienda de comestibles. Esta incluye frutas y vegetales frescos, granos a granel, carnes frescas y productos lácteos frescos.  Cocción  · Utilice métodos de cocción a baja temperatura, como hornear, en lugar de métodos de cocción a alta temperatura, como freír en abundante aceite.  · Cocine con aceites saludables, como el aceite de oliva, canola o girasol.  · Evite cocinar con manteca, crema o carnes con alto contenido de grasa.  Planificación de las comidas  · Consuma las comidas y las colaciones de forma regular, preferentemente a la misma hora todos los días. Evite pasar largos períodos de tiempo sin comer.  · Consuma alimentos ricos en fibra, como frutas frescas, verduras, frijoles y cereales integrales. Consulte al nutricionista sobre cuántas porciones de carbohidratos puede consumir en cada comida.  · Consuma entre 4 y 6 onzas de proteínas magras por día, como carnes magras, pollo, pescado, huevos o tofu. 1 onza equivale a 1 onza de carne, pollo o pescado, 1 huevo, o 1/4 taza de tofu.  · Coma algunos alimentos por día que contengan grasas saludables, como aguacates, frutos secos, semillas y pescado.  Estilo de vida     · Controle su nivel de glucemia con regularidad.  · Haga ejercicio al menos 30 minutos, 5 días o más por semana, o como se lo haya indicado el médico.  · Tome los medicamentos como se lo haya indicado el médico.  · No consuma ningún producto que contenga nicotina o tabaco, como cigarrillos y cigarrillos electrónicos. Si necesita ayuda para dejar de fumar, consulte al médico.  · Trabaje con un asesor o instructor en diabetes para identificar estrategias para controlar el estrés y cualquier desafío emocional y social.  ¿Cuáles son algunas de las preguntas que puedo hacerle a mi médico?  · ¿Es necesario que me reúna con un instructor en diabetes?  · ¿Es necesario que me reúna con un nutricionista?  · ¿A qué número puedo llamar si tengo preguntas?  · ¿Cuáles son los mejores momentos para controlar la glucemia?  Dónde encontrar más información:  · Asociación Americana de la Diabetes (American Diabetes Association): diabetes.org/food-and-fitness/food  · Academia de Nutrición y Dietética (Academy of Nutrition and Dietetics): www.eatright.org/resources/health/diseases-and-conditions/diabetes  · Instituto Nacional de la Diabetes y las Enfermedades Digestivas y Renales (National Institute of Diabetes and Digestive and Kidney Diseases) (Institutos Nacionales de Salud, NIH): www.niddk.nih.gov/health-information/diabetes/overview/diet-eating-physical-activity  Resumen  · Un plan de alimentación saludable   lo ayudará a controlar la glucemia y mantener un estilo de vida saludable.  · Trabajar con un especialista en dietas y nutrición (nutricionista) puede ayudarlo a elaborar el mejor plan de alimentación para usted.  · Tenga en cuenta que los carbohidratos y el alcohol tienen efectos inmediatos en sus niveles de glucemia. Es importante contar los carbohidratos y consumir alcohol con prudencia.  Esta información no tiene como fin reemplazar el consejo del médico. Asegúrese de hacerle al médico cualquier pregunta que tenga.   Document Released: 12/18/2007 Document Revised: 12/31/2016 Document Reviewed: 12/31/2016  Elsevier Interactive Patient Education © 2018 Elsevier Inc.

## 2018-04-18 ENCOUNTER — Telehealth (INDEPENDENT_AMBULATORY_CARE_PROVIDER_SITE_OTHER): Payer: Self-pay

## 2018-04-18 ENCOUNTER — Other Ambulatory Visit (INDEPENDENT_AMBULATORY_CARE_PROVIDER_SITE_OTHER): Payer: Self-pay | Admitting: Physician Assistant

## 2018-04-18 DIAGNOSIS — E7841 Elevated Lipoprotein(a): Secondary | ICD-10-CM

## 2018-04-18 LAB — COMPREHENSIVE METABOLIC PANEL
A/G RATIO: 1.2 (ref 1.2–2.2)
ALT: 20 IU/L (ref 0–32)
AST: 18 IU/L (ref 0–40)
Albumin: 4.2 g/dL (ref 3.5–5.5)
Alkaline Phosphatase: 113 IU/L (ref 39–117)
BUN / CREAT RATIO: 17 (ref 9–23)
BUN: 14 mg/dL (ref 6–24)
Bilirubin Total: 0.4 mg/dL (ref 0.0–1.2)
CALCIUM: 9.2 mg/dL (ref 8.7–10.2)
CHLORIDE: 101 mmol/L (ref 96–106)
CO2: 19 mmol/L — ABNORMAL LOW (ref 20–29)
CREATININE: 0.84 mg/dL (ref 0.57–1.00)
GFR calc non Af Amer: 87 mL/min/{1.73_m2} (ref >59)
GFR, EST AFRICAN AMERICAN: 100 mL/min/{1.73_m2} (ref >59)
GLOBULIN, TOTAL: 3.6 g/dL (ref 1.5–4.5)
Glucose: 290 mg/dL — ABNORMAL HIGH (ref 65–99)
Potassium: 3.6 mmol/L (ref 3.5–5.2)
Sodium: 137 mmol/L (ref 134–144)
Total Protein: 7.8 g/dL (ref 6.0–8.5)

## 2018-04-18 LAB — LIPID PANEL
CHOLESTEROL TOTAL: 181 mg/dL (ref 100–199)
Chol/HDL Ratio: 4.6 ratio — ABNORMAL HIGH (ref 0.0–4.4)
HDL: 39 mg/dL — ABNORMAL LOW (ref >39)
LDL CALC: 120 mg/dL — AB (ref 0–99)
TRIGLYCERIDES: 110 mg/dL (ref 0–149)
VLDL Cholesterol Cal: 22 mg/dL (ref 5–40)

## 2018-04-18 LAB — CBC WITH DIFFERENTIAL/PLATELET
Basophils Absolute: 0 10*3/uL (ref 0.0–0.2)
Basos: 1 %
EOS (ABSOLUTE): 0.2 10*3/uL (ref 0.0–0.4)
EOS: 2 %
HEMOGLOBIN: 14.1 g/dL (ref 11.1–15.9)
Hematocrit: 41.8 % (ref 34.0–46.6)
Immature Grans (Abs): 0 10*3/uL (ref 0.0–0.1)
Immature Granulocytes: 0 %
LYMPHS ABS: 2.7 10*3/uL (ref 0.7–3.1)
Lymphs: 39 %
MCH: 28.4 pg (ref 26.6–33.0)
MCHC: 33.7 g/dL (ref 31.5–35.7)
MCV: 84 fL (ref 79–97)
MONOCYTES: 5 %
MONOS ABS: 0.3 10*3/uL (ref 0.1–0.9)
NEUTROS ABS: 3.7 10*3/uL (ref 1.4–7.0)
Neutrophils: 53 %
Platelets: 297 10*3/uL (ref 150–450)
RBC: 4.96 x10E6/uL (ref 3.77–5.28)
RDW: 13.1 % (ref 12.3–15.4)
WBC: 7 10*3/uL (ref 3.4–10.8)

## 2018-04-18 LAB — TSH: TSH: 1.88 u[IU]/mL (ref 0.450–4.500)

## 2018-04-18 MED ORDER — ATORVASTATIN CALCIUM 40 MG PO TABS: 40.0000 mg | ORAL_TABLET | Freq: Every day | ORAL | 3 refills | Status: DC

## 2018-04-18 NOTE — Telephone Encounter (Signed)
-----   Message from Loletta Specteroger David Gomez, PA-C sent at 04/18/2018 12:42 PM EDT ----- Cholesterol elevated. I have sent atorvastatin to her pharmacy. She should not become pregnant while taking atorvastatin or breast feed while on atorvastatin. She may opt to start atorvastatin after she finishes breast feeding. Please advise to consume a low fat diet.

## 2018-04-18 NOTE — Telephone Encounter (Signed)
Call placed using pacific interpreter paola [phone]) left message asking patient to call the clinic. Maryjean Mornempestt S Roberts, CMA

## 2018-04-22 ENCOUNTER — Encounter (INDEPENDENT_AMBULATORY_CARE_PROVIDER_SITE_OTHER): Payer: Self-pay

## 2018-04-22 ENCOUNTER — Telehealth (INDEPENDENT_AMBULATORY_CARE_PROVIDER_SITE_OTHER): Payer: Self-pay

## 2018-04-22 NOTE — Telephone Encounter (Signed)
-----   Message from Loletta Specteroger David Gomez, PA-C sent at 04/18/2018 12:42 PM EDT ----- Cholesterol elevated. I have sent atorvastatin to her pharmacy. She should not become pregnant while taking atorvastatin or breast feed while on atorvastatin. She may opt to start atorvastatin after she finishes breast feeding. Please advise to consume a low fat diet.

## 2018-04-22 NOTE — Telephone Encounter (Signed)
Call placed using pacific interpreter [place] [phone] [phone]) left voicemail asking patient to return call to the office. Results mailed. Maryjean Mornempestt S Roberts, CMA

## 2018-04-24 ENCOUNTER — Telehealth (INDEPENDENT_AMBULATORY_CARE_PROVIDER_SITE_OTHER): Payer: Self-pay | Admitting: Internal Medicine

## 2018-04-24 NOTE — Telephone Encounter (Signed)
Patient called stating that ever since she has been taking her insulin patient has notice that her vision has become blurry. Patient states that she can not read her items on her cell phone or anything else when it is close up to her but can almost distinguishes things on the television since it is far. Patient does not know if this could be a medication reaction.  Please Advice [phone]  Thank You [name] Perry

## 2018-04-24 NOTE — Telephone Encounter (Signed)
Pt may likely be experiencing hypoglycemia. I suggest she measure her blood sugars to see if her sugar is dropping too much when she injects insulin. She should be between 80 - 150 as optimal range. I have left message stating the same. Please call again and make sure we have positive contact with patient.

## 2018-04-24 NOTE — Telephone Encounter (Signed)
FWD to PCP. Halina Asano S Hayzel Ruberg, CMA  

## 2018-04-25 ENCOUNTER — Telehealth (INDEPENDENT_AMBULATORY_CARE_PROVIDER_SITE_OTHER): Payer: Self-pay | Admitting: Internal Medicine

## 2018-04-25 NOTE — Telephone Encounter (Signed)
Yes, continue with insulin as I have directed last week.

## 2018-04-25 NOTE — Telephone Encounter (Signed)
FWD to PCP. [name] Perry, CMA  

## 2018-04-25 NOTE — Telephone Encounter (Signed)
Please call patient and inform. [name] Perry, CMA

## 2018-04-25 NOTE — Telephone Encounter (Signed)
Patient called to inform that her blood sugar is at 110 this morning at 7:52 and has not eaten. Patient wants to know if she has to keep taking her insulin.  Pleas advice [phone]  Thank You

## 2018-04-25 NOTE — Telephone Encounter (Signed)
I Already did Patient stated her blood sugar was 110 this morning @ 7:52 and wanted to know if she should keep taking her medication.

## 2018-04-28 NOTE — Telephone Encounter (Signed)
Patient Aware.  Thank You [name] Perry

## 2018-04-29 ENCOUNTER — Ambulatory Visit: Payer: Self-pay | Attending: Physician Assistant

## 2018-05-16 MED FILL — ATORVASTATIN CALCIUM 40 MG: 40 | 30 days supply | Qty: 30 | Fill #0

## 2018-05-19 MED FILL — METFORMIN HCL ER 500 MG TAB: 500 | 30 days supply | Qty: 60 | Fill #1

## 2018-05-21 ENCOUNTER — Other Ambulatory Visit (HOSPITAL_COMMUNITY): Payer: Self-pay | Admitting: *Deleted

## 2018-05-21 DIAGNOSIS — Z01419 Encounter for gynecological examination (general) (routine) without abnormal findings: Secondary | ICD-10-CM

## 2018-05-22 ENCOUNTER — Other Ambulatory Visit: Payer: Self-pay

## 2018-05-22 ENCOUNTER — Encounter (INDEPENDENT_AMBULATORY_CARE_PROVIDER_SITE_OTHER): Payer: Self-pay | Admitting: Physician Assistant

## 2018-05-22 ENCOUNTER — Ambulatory Visit (INDEPENDENT_AMBULATORY_CARE_PROVIDER_SITE_OTHER): Payer: Self-pay | Admitting: Physician Assistant

## 2018-05-22 VITALS — BP 119/79 | HR 59 | Temp 98.0°F | Ht 65.0 in | Wt 142.6 lb

## 2018-05-22 DIAGNOSIS — Z23 Encounter for immunization: Secondary | ICD-10-CM

## 2018-05-22 DIAGNOSIS — E119 Type 2 diabetes mellitus without complications: Secondary | ICD-10-CM

## 2018-05-22 MED ORDER — GLUCOSE BLOOD VI STRP: ORAL_STRIP | 11 refills | Status: DC

## 2018-05-22 MED ORDER — TRUEPLUS LANCETS 30G MISC: 1.0000 | Freq: Three times a day (TID) | 11 refills | Status: DC

## 2018-05-22 NOTE — Patient Instructions (Addendum)
Diabetes mellitus y nutrición  Diabetes Mellitus and Nutrition  Si sufre de diabetes (diabetes mellitus), es muy importante tener hábitos alimenticios saludables debido a que sus niveles de azúcar en la sangre (glucosa) se ven afectados en gran medida por lo que come y bebe. Comer alimentos saludables en las cantidades adecuadas, aproximadamente a la misma hora todos los días, lo ayudará a:  · Controlar la glucemia.  · Disminuir el riesgo de sufrir una enfermedad cardíaca.  · Mejorar la presión arterial.  · Alcanzar o mantener un peso saludable.    Todas las personas que sufren de diabetes son diferentes y cada una tiene necesidades diferentes en cuanto a un plan de alimentación. El médico puede recomendarle que trabaje con un especialista en dietas y nutrición (nutricionista) para elaborar el mejor plan para usted. Su plan de alimentación puede variar según factores como:  · Las calorías que necesita.  · Los medicamentos que toma.  · Su peso.  · Sus niveles de glucemia, presión arterial y colesterol.  · Su nivel de actividad.  · Otras afecciones que tenga, como enfermedades cardíacas o renales.    ¿Cómo me afectan los carbohidratos?  Los carbohidratos afectan el nivel de glucemia más que cualquier otro tipo de alimento. La ingesta de carbohidratos naturalmente aumenta la cantidad glucosa en la sangre. El recuento de carbohidratos es un método destinado a llevar un registro de la cantidad de carbohidratos que se ingieren. El recuento de carbohidratos es importante para mantener la glucemia a un nivel saludable, en especial si utiliza insulina o toma determinados medicamentos por vía oral para la diabetes.  Es importante saber la cantidad de carbohidratos que se pueden ingerir en cada comida sin correr ningún riesgo. Esto es diferente en cada persona. El nutricionista puede ayudarlo a calcular la cantidad de carbohidratos que debe ingerir en cada comida y colación.   Los alimentos que contienen carbohidratos incluyen:  · Pan, cereal, arroz, pasta y galletas.  · Papas y maíz.  · Guisantes, frijoles y lentejas.  · Leche y yogur.  · Frutas y jugo.  · Postres, como pasteles, galletitas, helado y caramelos.    ¿Cómo me afecta el alcohol?  El alcohol puede provocar disminuciones súbitas de la glucemia (hipoglucemia), en especial si utiliza insulina o toma determinados medicamentos por vía oral para la diabetes. La hipoglucemia es una afección potencialmente mortal. Los síntomas de la hipoglucemia (somnolencia, mareos y confusión) son similares a los síntomas de haber consumido demasiado alcohol.  Si el médico afirma que el alcohol es seguro para usted, siga estas pautas:  · Limite el consumo de alcohol a no más de 1 medida por día si es mujer y no está embarazada, y a 2 medidas si es hombre. Una medida equivale a 12 oz (355 ml) de cerveza, 5 oz (148 ml) de vino o 1½ oz (44 ml) de bebidas de alta graduación alcohólica.  · No beba con el estómago vacío.  · Manténgase hidratado con agua, gaseosas dietéticas o té helado sin azúcar.  · Tenga en cuenta que las gaseosas comunes, los jugos y otros refrescos pueden contener mucha azúcar y se deben contar como carbohidratos.    Consejos para seguir este plan  Leer las etiquetas de los alimentos  · Comience por controlar el tamaño de la porción en la etiqueta. La cantidad de calorías, carbohidratos, grasas y otros nutrientes mencionados en la etiqueta se basan en una porción del alimento. Muchos alimentos contienen más de una porción por envase.  · Verifique la cantidad total de gramos (g)   de carbohidratos totales en una porción. Puede calcular la cantidad de porciones de carbohidratos al dividir el total de carbohidratos por 15. Por ejemplo, si un alimento posee un total de 30 g de carbohidratos, equivale a 2 porciones de carbohidratos.  · Verifique la cantidad de gramos (g) de grasas saturadas y grasas trans  en una porción. Escoja alimentos que no contengan grasa o que tengan un bajo contenido.  · Controle la cantidad de miligramos (mg) de sodio en una porción. La mayoría de las personas deben limitar la ingesta de sodio total a menos de 2300 mg por día.  · Siempre consulte la información nutricional de los alimentos etiquetados como “con bajo contenido de grasa” o “sin grasa”. Estos alimentos pueden ser más altos en azúcar agregada o en carbohidratos refinados y deben evitarse.  · Hable con el nutricionista para identificar sus objetivos diarios en cuanto a los nutrientes mencionados en la etiqueta.  De compras  · Evite comprar alimentos procesados, enlatados o prehechos. Estos alimentos tienden a tener mayor cantidad de grasa, sodio y azúcar agregada.  · Compre en la zona exterior de la tienda de comestibles. Esta incluye frutas y vegetales frescos, granos a granel, carnes frescas y productos lácteos frescos.  Cocción  · Utilice métodos de cocción a baja temperatura, como hornear, en lugar de métodos de cocción a alta temperatura, como freír en abundante aceite.  · Cocine con aceites saludables, como el aceite de oliva, canola o girasol.  · Evite cocinar con manteca, crema o carnes con alto contenido de grasa.  Planificación de las comidas  · Consuma las comidas y las colaciones de forma regular, preferentemente a la misma hora todos los días. Evite pasar largos períodos de tiempo sin comer.  · Consuma alimentos ricos en fibra, como frutas frescas, verduras, frijoles y cereales integrales. Consulte al nutricionista sobre cuántas porciones de carbohidratos puede consumir en cada comida.  · Consuma entre 4 y 6 onzas de proteínas magras por día, como carnes magras, pollo, pescado, huevos o tofu. 1 onza equivale a 1 onza de carne, pollo o pescado, 1 huevo, o 1/4 taza de tofu.  · Coma algunos alimentos por día que contengan grasas saludables, como aguacates, frutos secos, semillas y pescado.  Estilo de vida     · Controle su nivel de glucemia con regularidad.  · Haga ejercicio al menos 30 minutos, 5 días o más por semana, o como se lo haya indicado el médico.  · Tome los medicamentos como se lo haya indicado el médico.  · No consuma ningún producto que contenga nicotina o tabaco, como cigarrillos y cigarrillos electrónicos. Si necesita ayuda para dejar de fumar, consulte al médico.  · Trabaje con un asesor o instructor en diabetes para identificar estrategias para controlar el estrés y cualquier desafío emocional y social.  ¿Cuáles son algunas de las preguntas que puedo hacerle a mi médico?  · ¿Es necesario que me reúna con un instructor en diabetes?  · ¿Es necesario que me reúna con un nutricionista?  · ¿A qué número puedo llamar si tengo preguntas?  · ¿Cuáles son los mejores momentos para controlar la glucemia?  Dónde encontrar más información:  · Asociación Americana de la Diabetes (American Diabetes Association): diabetes.org/food-and-fitness/food  · Academia de Nutrición y Dietética (Academy of Nutrition and Dietetics): www.eatright.org/resources/health/diseases-and-conditions/diabetes  · Instituto Nacional de la Diabetes y las Enfermedades Digestivas y Renales (National Institute of Diabetes and Digestive and Kidney Diseases) (Institutos Nacionales de Salud, NIH): www.niddk.nih.gov/health-information/diabetes/overview/diet-eating-physical-activity  Resumen  · Un plan de alimentación saludable   lo ayudará a controlar la glucemia y mantener un estilo de vida saludable.  · Trabajar con un especialista en dietas y nutrición (nutricionista) puede ayudarlo a elaborar el mejor plan de alimentación para usted.  · Tenga en cuenta que los carbohidratos y el alcohol tienen efectos inmediatos en sus niveles de glucemia. Es importante contar los carbohidratos y consumir alcohol con prudencia.  Esta información no tiene como fin reemplazar el consejo del médico. Asegúrese de hacerle al médico cualquier pregunta que tenga.   Document Released: 12/18/2007 Document Revised: 12/31/2016 Document Reviewed: 12/31/2016  Elsevier Interactive Patient Education © 2018 Elsevier Inc.

## 2018-05-22 NOTE — Progress Notes (Signed)
Subjective:  Patient ID: [name] Perry, female    DOB: [birth year]  Age: 42 y.o. MRN: [id number]  CC: f/u DM   HPI [name] Perry is a 42 y.o. female with a medical history of anxiety, HTN, ectopic pregnancy, and gestational diabetes presents to f/u on DM2. A1c 13.3% last month. Brings glucose log today as requested. Nearly all of patient's readings are between 80 and 150. Taking Lantus 15 units qhs and Metformin 500 mg XR two tablets qam as directed. Had initial visual blurring with the use of insulin but vision is now normal and better than before she started the insulin. Does not endorse any symptoms whatsoever. Feels well.     BP noted to be normal. Has not taken Labetalol since 5 months ago. Originally prescribed due to pre-eclampsia.     Outpatient Medications Prior to Visit  Medication Sig Dispense Refill  . blood glucose meter kit and supplies KIT Dispense based on patient and insurance preference. Use up to four times daily as directed. (FOR ICD-9 250.00, 250.01). 1 each 0  . insulin glargine (LANTUS) 100 UNIT/ML injection Inject 0.15 mLs (15 Units total) into the skin daily. 10 mL 1  . Insulin Syringe-Needle U-100 (INSULIN SYRINGE 1CC/31GX5/16") 31G X 5/16" 1 ML MISC Inject 1 each into the skin daily. 30 each 5  . metFORMIN (GLUCOPHAGE XR) 500 MG 24 hr tablet Take 2 tablets (1,000 mg total) by mouth daily with breakfast. 60 tablet 11  . atorvastatin (LIPITOR) 40 MG tablet Take 1 tablet (40 mg total) by mouth daily. (Patient not taking: Reported on 05/22/2018) 90 tablet 3  . labetalol (NORMODYNE) 100 MG tablet TAKE 1/2 TABLET BY MOUTH 2 TIMES DAILY (Patient not taking: Reported on 04/17/2018) 30 tablet 1   No facility-administered medications prior to visit.      ROS Review of Systems  Constitutional: Negative for chills, fever and malaise/fatigue.  Eyes: Negative for blurred vision.  Respiratory: Negative for shortness of breath.   Cardiovascular: Negative for  chest pain and palpitations.  Gastrointestinal: Negative for abdominal pain and nausea.  Genitourinary: Negative for dysuria and hematuria.  Musculoskeletal: Negative for joint pain and myalgias.  Skin: Negative for rash.  Neurological: Negative for tingling and headaches.  Psychiatric/Behavioral: Negative for depression. The patient is not nervous/anxious.     Objective:  BP 119/79 (BP Location: Left Arm, Patient Position: Sitting, Cuff Size: Normal)   Pulse (!) 59   Temp 98 F (36.7 C) (Oral)   Ht '5\' 5"'$  (1.651 m)   Wt 142 lb 9.6 oz (64.7 kg)   LMP 04/21/2018   SpO2 98%   Breastfeeding? No   BMI 23.73 kg/m   BP/Weight 05/22/2018 04/17/2018 0/48/8891  Systolic BP 694 503 888  Diastolic BP 79 80 99  Wt. (Lbs) 142.6 142 165  BMI 23.73 23.63 27.46      Physical Exam  Constitutional: She is oriented to person, place, and time.  Well developed, well nourished, NAD, polite  HENT:  Head: Normocephalic and atraumatic.  Eyes: No scleral icterus.  Neck: Normal range of motion. Neck supple. No thyromegaly present.  Cardiovascular: Normal rate, regular rhythm and normal heart sounds.  Pulmonary/Chest: Effort normal and breath sounds normal.  Abdominal: Soft. Bowel sounds are normal. There is no tenderness.  Musculoskeletal: She exhibits no edema.  Neurological: She is alert and oriented to person, place, and time.  Skin: Skin is warm and dry. No rash noted. No erythema. No pallor.  Psychiatric: She has a normal  mood and affect. Her behavior is normal. Thought content normal.  Vitals reviewed.    Assessment & Plan:   1. Type 2 diabetes mellitus without complication, without long-term current use of insulin (HCC) - Very good control with Lantus 15 units and Metformin 500 mg XR two tabs qam. - Microalbumin / creatinine urine ratio - glucose blood (TRUE METRIX BLOOD GLUCOSE TEST) test strip; Use three times a day.  Dispense: 100 each; Refill: 11 - TRUEPLUS LANCETS 30G MISC;  Inject 1 each into the skin 3 (three) times daily.  Dispense: 100 each; Refill: 11  2. Need for prophylactic vaccination and inoculation against influenza - Flu Vaccine QUAD 6+ mos PF IM (Fluarix Quad PF)   Meds ordered this encounter  Medications  . glucose blood (TRUE METRIX BLOOD GLUCOSE TEST) test strip    Sig: Use three times a day.    Dispense:  100 each    Refill:  11    Order Specific Question:   Supervising Provider    Answer:   Charlott Rakes [[id number]]  . TRUEPLUS LANCETS 30G MISC    Sig: Inject 1 each into the skin 3 (three) times daily.    Dispense:  100 each    Refill:  11    Substitution permitted.    Order Specific Question:   Supervising Provider    Answer:   Charlott Rakes [[id number]]    Follow-up: Return in about 8 weeks (around 07/17/2018) for DM2 and A1c.   Clent Demark PA

## 2018-05-23 ENCOUNTER — Other Ambulatory Visit: Payer: Self-pay

## 2018-05-23 ENCOUNTER — Ambulatory Visit: Payer: Self-pay

## 2018-05-23 LAB — MICROALBUMIN / CREATININE URINE RATIO
CREATININE, UR: 61.5 mg/dL
Microalbumin, Urine: <3 ug/mL

## 2018-05-23 NOTE — Addendum Note (Signed)
Addended by: Jalicia Roszak on: 05/23/2018 08:46 AM   Modules accepted: Orders  

## 2018-05-23 NOTE — Addendum Note (Signed)
Addended by: Donia GuilesPLEASANT, Ankush Gintz on: 05/23/2018 08:46 AM   Modules accepted: Orders

## 2018-05-29 ENCOUNTER — Telehealth (INDEPENDENT_AMBULATORY_CARE_PROVIDER_SITE_OTHER): Payer: Self-pay

## 2018-05-29 NOTE — Telephone Encounter (Signed)
Call placed using pacific interpreter [phone]) left voicemail notifying patient that urinary proteins are normal. Call [facility] with any questions or concerns. Maryjean Morn, CMA

## 2018-05-29 NOTE — Telephone Encounter (Signed)
-----   Message from Loletta Specter, PA-C sent at 05/27/2018  6:08 PM EDT ----- Normal urinary proteins.

## 2018-06-10 MED FILL — TRUEplus LANCETS 28G MISC: 30 days supply | Qty: 100 | Fill #0

## 2018-06-10 MED FILL — TRUE METRIX TEST STRIP: 30 days supply | Qty: 100 | Fill #0

## 2018-06-18 MED FILL — METFORMIN HCL ER 500 MG TAB: 500 | 30 days supply | Qty: 60 | Fill #2

## 2018-07-22 ENCOUNTER — Ambulatory Visit (INDEPENDENT_AMBULATORY_CARE_PROVIDER_SITE_OTHER): Payer: Self-pay | Admitting: Physician Assistant

## 2018-07-29 MED FILL — METFORMIN HCL ER 500 MG TAB: 500 | 30 days supply | Qty: 60 | Fill #3

## 2018-08-11 ENCOUNTER — Encounter (INDEPENDENT_AMBULATORY_CARE_PROVIDER_SITE_OTHER): Payer: Self-pay | Admitting: Physician Assistant

## 2018-08-11 ENCOUNTER — Other Ambulatory Visit: Payer: Self-pay

## 2018-08-11 ENCOUNTER — Ambulatory Visit (INDEPENDENT_AMBULATORY_CARE_PROVIDER_SITE_OTHER): Payer: Self-pay | Admitting: Physician Assistant

## 2018-08-11 VITALS — BP 118/80 | HR 67 | Temp 97.6°F | Ht 65.0 in | Wt 138.2 lb

## 2018-08-11 DIAGNOSIS — E119 Type 2 diabetes mellitus without complications: Secondary | ICD-10-CM

## 2018-08-11 LAB — POCT GLYCOSYLATED HEMOGLOBIN (HGB A1C): Hemoglobin A1C: 5.3 % (ref 4.0–5.6)

## 2018-08-11 MED ORDER — GLUCOSE BLOOD VI STRP: ORAL_STRIP | 12 refills | Status: DC

## 2018-08-11 MED ORDER — GLIMEPIRIDE 2 MG PO TABS: 2.0000 mg | ORAL_TABLET | Freq: Every day | ORAL | 11 refills | Status: DC

## 2018-08-11 MED ORDER — TRUEPLUS LANCETS 30G MISC: 1.0000 | Freq: Three times a day (TID) | 12 refills | Status: DC

## 2018-08-11 MED ORDER — METFORMIN HCL ER 500 MG PO TB24: 1000.0000 mg | ORAL_TABLET | Freq: Every day | ORAL | 11 refills | Status: DC

## 2018-08-11 MED FILL — TRUE METRIX TEST STRIP: 30 days supply | Qty: 100 | Fill #0

## 2018-08-11 MED FILL — GLIMEPIRIDE 2 MG TABS: 2 | 30 days supply | Qty: 30 | Fill #0

## 2018-08-11 MED FILL — TRUEplus LANCETS 30G MISC: 30 days supply | Qty: 100 | Fill #0

## 2018-08-11 NOTE — Progress Notes (Signed)
Subjective:  Patient ID: [name] Perry, female    DOB: [birth year]  Age: 42 y.o. MRN: [id number]  CC: f/u DM  HPI [name] Perry a 42 y.o.femalewith a medical history of anxiety, HTN, ectopic pregnancy, and gestational diabetes presents to f/u on DM2. Last A1c 13.3% four months ago. Reports taking Lantus 15 units and Metformin 500 mg XR tablets as directed. Glucometer readings are currently between 80 - 150. Pt is not currently exercising but has changed diet to include lean meats and vegetables. Has drastically reduced breads, tortillas, carbs. Does not endorse polydipsia, polyuria, polyphagia, fatigue, tingling, numbness, or visual blurring. Does not endorse any other symptoms or complaints. A1c 5.3% today.       Outpatient Medications Prior to Visit  Medication Sig Dispense Refill  . blood glucose meter kit and supplies KIT Dispense based on patient and insurance preference. Use up to four times daily as directed. (FOR ICD-9 250.00, 250.01). 1 each 0  . glucose blood (TRUE METRIX BLOOD GLUCOSE TEST) test strip Use three times a day. 100 each 11  . insulin glargine (LANTUS) 100 UNIT/ML injection Inject 0.15 mLs (15 Units total) into the skin daily. 10 mL 1  . Insulin Syringe-Needle U-100 (INSULIN SYRINGE 1CC/31GX5/16") 31G X 5/16" 1 ML MISC Inject 1 each into the skin daily. 30 each 5  . metFORMIN (GLUCOPHAGE XR) 500 MG 24 hr tablet Take 2 tablets (1,000 mg total) by mouth daily with breakfast. 60 tablet 11  . TRUEPLUS LANCETS 30G MISC Inject 1 each into the skin 3 (three) times daily. 100 each 11  . atorvastatin (LIPITOR) 40 MG tablet Take 1 tablet (40 mg total) by mouth daily. (Patient not taking: Reported on 05/22/2018) 90 tablet 3   No facility-administered medications prior to visit.      ROS Review of Systems  Constitutional: Negative for chills, fever and malaise/fatigue.  Eyes: Negative for blurred vision.  Respiratory: Negative for shortness of breath.    Cardiovascular: Negative for chest pain and palpitations.  Gastrointestinal: Negative for abdominal pain and nausea.  Genitourinary: Negative for dysuria and hematuria.  Musculoskeletal: Negative for joint pain and myalgias.  Skin: Negative for rash.  Neurological: Negative for tingling and headaches.  Psychiatric/Behavioral: Negative for depression. The patient is not nervous/anxious.     Objective:  Ht '5\' 5"'$  (1.651 m)   Wt 138 lb 3.2 oz (62.7 kg)   LMP 05/11/2018 (Approximate)   Breastfeeding? Yes   BMI 23.00 kg/m   Vitals:   08/11/18 0901  BP: 118/80  Pulse: 67  Temp: 97.6 F (36.4 C)  TempSrc: Oral  SpO2: 97%  Weight: 138 lb 3.2 oz (62.7 kg)  Height: '5\' 5"'$  (1.651 m)      Physical Exam  Constitutional: She is oriented to person, place, and time.  Well developed, well nourished, NAD, polite  HENT:  Head: Normocephalic and atraumatic.  Eyes: No scleral icterus.  Neck: Normal range of motion. Neck supple. No thyromegaly present.  Cardiovascular: Normal rate and regular rhythm. Exam reveals gallop.  Pulmonary/Chest: Effort normal and breath sounds normal.  Abdominal: Soft. Bowel sounds are normal. There is no tenderness.  Musculoskeletal: She exhibits edema.  Neurological: She is alert and oriented to person, place, and time.  Skin: Skin is warm and dry. No rash noted. No erythema. No pallor.  Psychiatric: She has a normal mood and affect. Her behavior is normal. Thought content normal.  Vitals reviewed.    Assessment & Plan:    1. Type 2  diabetes mellitus without complication, without long-term current use of insulin (HCC) - HgB A1c 5.3% - Refill TRUEPLUS LANCETS 30G MISC; Inject 1 each into the skin 3 (three) times daily.  Dispense: 100 each; Refill: 12 - Begin glimepiride (AMARYL) 2 MG tablet; Take 1 tablet (2 mg total) by mouth daily before breakfast.  Dispense: 30 tablet; Refill: 11 - Refill metFORMIN (GLUCOPHAGE XR) 500 MG 24 hr tablet; Take 2 tablets  (1,000 mg total) by mouth daily with breakfast.  Dispense: 60 tablet; Refill: 11   Meds ordered this encounter  Medications  . TRUEPLUS LANCETS 30G MISC    Sig: Inject 1 each into the skin 3 (three) times daily.    Dispense:  100 each    Refill:  12    Substitution permitted.    Order Specific Question:   Supervising Provider    Answer:   Charlott Rakes [[id number]]  . glucose blood (TRUE METRIX BLOOD GLUCOSE TEST) test strip    Sig: Use three times a day.    Dispense:  100 each    Refill:  12    Order Specific Question:   Supervising Provider    Answer:   Charlott Rakes [[id number]]  . glimepiride (AMARYL) 2 MG tablet    Sig: Take 1 tablet (2 mg total) by mouth daily before breakfast.    Dispense:  30 tablet    Refill:  11    Order Specific Question:   Supervising Provider    Answer:   Charlott Rakes [[id number]]  . metFORMIN (GLUCOPHAGE XR) 500 MG 24 hr tablet    Sig: Take 2 tablets (1,000 mg total) by mouth daily with breakfast.    Dispense:  60 tablet    Refill:  11    Order Specific Question:   Supervising Provider    Answer:   Charlott Rakes [[id number]]    Follow-up: Return in about 6 months (around 02/09/2019).   Clent Demark PA

## 2018-08-11 NOTE — Patient Instructions (Signed)
Diabetes mellitus y nutrición  Diabetes Mellitus and Nutrition  Si sufre de diabetes (diabetes mellitus), es muy importante tener hábitos alimenticios saludables debido a que sus niveles de azúcar en la sangre (glucosa) se ven afectados en gran medida por lo que come y bebe. Comer alimentos saludables en las cantidades adecuadas, aproximadamente a la misma hora todos los días, lo ayudará a:  · Controlar la glucemia.  · Disminuir el riesgo de sufrir una enfermedad cardíaca.  · Mejorar la presión arterial.  · Alcanzar o mantener un peso saludable.    Todas las personas que sufren de diabetes son diferentes y cada una tiene necesidades diferentes en cuanto a un plan de alimentación. El médico puede recomendarle que trabaje con un especialista en dietas y nutrición (nutricionista) para elaborar el mejor plan para usted. Su plan de alimentación puede variar según factores como:  · Las calorías que necesita.  · Los medicamentos que toma.  · Su peso.  · Sus niveles de glucemia, presión arterial y colesterol.  · Su nivel de actividad.  · Otras afecciones que tenga, como enfermedades cardíacas o renales.    ¿Cómo me afectan los carbohidratos?  Los carbohidratos afectan el nivel de glucemia más que cualquier otro tipo de alimento. La ingesta de carbohidratos naturalmente aumenta la cantidad glucosa en la sangre. El recuento de carbohidratos es un método destinado a llevar un registro de la cantidad de carbohidratos que se ingieren. El recuento de carbohidratos es importante para mantener la glucemia a un nivel saludable, en especial si utiliza insulina o toma determinados medicamentos por vía oral para la diabetes.  Es importante saber la cantidad de carbohidratos que se pueden ingerir en cada comida sin correr ningún riesgo. Esto es diferente en cada persona. El nutricionista puede ayudarlo a calcular la cantidad de carbohidratos que debe ingerir en cada comida y colación.   Los alimentos que contienen carbohidratos incluyen:  · Pan, cereal, arroz, pasta y galletas.  · Papas y maíz.  · Guisantes, frijoles y lentejas.  · Leche y yogur.  · Frutas y jugo.  · Postres, como pasteles, galletitas, helado y caramelos.    ¿Cómo me afecta el alcohol?  El alcohol puede provocar disminuciones súbitas de la glucemia (hipoglucemia), en especial si utiliza insulina o toma determinados medicamentos por vía oral para la diabetes. La hipoglucemia es una afección potencialmente mortal. Los síntomas de la hipoglucemia (somnolencia, mareos y confusión) son similares a los síntomas de haber consumido demasiado alcohol.  Si el médico afirma que el alcohol es seguro para usted, siga estas pautas:  · Limite el consumo de alcohol a no más de 1 medida por día si es mujer y no está embarazada, y a 2 medidas si es hombre. Una medida equivale a 12 oz (355 ml) de cerveza, 5 oz (148 ml) de vino o 1½ oz (44 ml) de bebidas de alta graduación alcohólica.  · No beba con el estómago vacío.  · Manténgase hidratado con agua, gaseosas dietéticas o té helado sin azúcar.  · Tenga en cuenta que las gaseosas comunes, los jugos y otros refrescos pueden contener mucha azúcar y se deben contar como carbohidratos.    Consejos para seguir este plan  Leer las etiquetas de los alimentos  · Comience por controlar el tamaño de la porción en la etiqueta. La cantidad de calorías, carbohidratos, grasas y otros nutrientes mencionados en la etiqueta se basan en una porción del alimento. Muchos alimentos contienen más de una porción por envase.  · Verifique la cantidad total de gramos (g)   de carbohidratos totales en una porción. Puede calcular la cantidad de porciones de carbohidratos al dividir el total de carbohidratos por 15. Por ejemplo, si un alimento posee un total de 30 g de carbohidratos, equivale a 2 porciones de carbohidratos.  · Verifique la cantidad de gramos (g) de grasas saturadas y grasas trans  en una porción. Escoja alimentos que no contengan grasa o que tengan un bajo contenido.  · Controle la cantidad de miligramos (mg) de sodio en una porción. La mayoría de las personas deben limitar la ingesta de sodio total a menos de 2300 mg por día.  · Siempre consulte la información nutricional de los alimentos etiquetados como “con bajo contenido de grasa” o “sin grasa”. Estos alimentos pueden ser más altos en azúcar agregada o en carbohidratos refinados y deben evitarse.  · Hable con el nutricionista para identificar sus objetivos diarios en cuanto a los nutrientes mencionados en la etiqueta.  De compras  · Evite comprar alimentos procesados, enlatados o prehechos. Estos alimentos tienden a tener mayor cantidad de grasa, sodio y azúcar agregada.  · Compre en la zona exterior de la tienda de comestibles. Esta incluye frutas y vegetales frescos, granos a granel, carnes frescas y productos lácteos frescos.  Cocción  · Utilice métodos de cocción a baja temperatura, como hornear, en lugar de métodos de cocción a alta temperatura, como freír en abundante aceite.  · Cocine con aceites saludables, como el aceite de oliva, canola o girasol.  · Evite cocinar con manteca, crema o carnes con alto contenido de grasa.  Planificación de las comidas  · Consuma las comidas y las colaciones de forma regular, preferentemente a la misma hora todos los días. Evite pasar largos períodos de tiempo sin comer.  · Consuma alimentos ricos en fibra, como frutas frescas, verduras, frijoles y cereales integrales. Consulte al nutricionista sobre cuántas porciones de carbohidratos puede consumir en cada comida.  · Consuma entre 4 y 6 onzas de proteínas magras por día, como carnes magras, pollo, pescado, huevos o tofu. 1 onza equivale a 1 onza de carne, pollo o pescado, 1 huevo, o 1/4 taza de tofu.  · Coma algunos alimentos por día que contengan grasas saludables, como aguacates, frutos secos, semillas y pescado.  Estilo de vida     · Controle su nivel de glucemia con regularidad.  · Haga ejercicio al menos 30 minutos, 5 días o más por semana, o como se lo haya indicado el médico.  · Tome los medicamentos como se lo haya indicado el médico.  · No consuma ningún producto que contenga nicotina o tabaco, como cigarrillos y cigarrillos electrónicos. Si necesita ayuda para dejar de fumar, consulte al médico.  · Trabaje con un asesor o instructor en diabetes para identificar estrategias para controlar el estrés y cualquier desafío emocional y social.  ¿Cuáles son algunas de las preguntas que puedo hacerle a mi médico?  · ¿Es necesario que me reúna con un instructor en diabetes?  · ¿Es necesario que me reúna con un nutricionista?  · ¿A qué número puedo llamar si tengo preguntas?  · ¿Cuáles son los mejores momentos para controlar la glucemia?  Dónde encontrar más información:  · Asociación Americana de la Diabetes (American Diabetes Association): diabetes.org/food-and-fitness/food  · Academia de Nutrición y Dietética (Academy of Nutrition and Dietetics): www.eatright.org/resources/health/diseases-and-conditions/diabetes  · Instituto Nacional de la Diabetes y las Enfermedades Digestivas y Renales (National Institute of Diabetes and Digestive and Kidney Diseases) (Institutos Nacionales de Salud, NIH): www.niddk.nih.gov/health-information/diabetes/overview/diet-eating-physical-activity  Resumen  · Un plan de alimentación saludable   lo ayudará a controlar la glucemia y mantener un estilo de vida saludable.  · Trabajar con un especialista en dietas y nutrición (nutricionista) puede ayudarlo a elaborar el mejor plan de alimentación para usted.  · Tenga en cuenta que los carbohidratos y el alcohol tienen efectos inmediatos en sus niveles de glucemia. Es importante contar los carbohidratos y consumir alcohol con prudencia.  Esta información no tiene como fin reemplazar el consejo del médico. Asegúrese de hacerle al médico cualquier pregunta que tenga.   Document Released: 12/18/2007 Document Revised: 12/31/2016 Document Reviewed: 12/31/2016  Elsevier Interactive Patient Education © 2018 Elsevier Inc.

## 2018-09-03 MED FILL — METFORMIN HCL ER 500 MG TAB: 500 | 30 days supply | Qty: 60 | Fill #0

## 2018-10-15 MED FILL — GLIMEPIRIDE 2 MG TABS: 2 | 30 days supply | Qty: 30 | Fill #1

## 2018-10-15 MED FILL — METFORMIN HCL ER 500 MG TAB: 500 | 30 days supply | Qty: 60 | Fill #1

## 2018-11-17 MED FILL — TRUEplus LANCETS 28G MISC: 30 days supply | Qty: 100 | Fill #1

## 2018-11-17 MED FILL — METFORMIN HCL ER 500 MG TAB: 500 | 30 days supply | Qty: 60 | Fill #2

## 2018-11-17 MED FILL — TRUE METRIX TEST STRIP: 30 days supply | Qty: 100 | Fill #1

## 2018-11-17 MED FILL — GLIMEPIRIDE 2 MG TABS: 2 | 30 days supply | Qty: 30 | Fill #2

## 2018-12-25 MED FILL — GLIMEPIRIDE 2 MG TABS: 2 | 30 days supply | Qty: 30 | Fill #3

## 2018-12-25 MED FILL — METFORMIN HCL ER 500 MG TAB: 500 | 30 days supply | Qty: 60 | Fill #3

## 2019-01-29 MED FILL — GLIMEPIRIDE 2 MG TABS: 2 | 30 days supply | Qty: 30 | Fill #4

## 2019-01-29 MED FILL — METFORMIN HCL ER 500 MG TAB: 500 | 30 days supply | Qty: 60 | Fill #4

## 2019-01-29 MED FILL — TRUE METRIX TEST STRIP: 30 days supply | Qty: 100 | Fill #2

## 2019-02-03 ENCOUNTER — Telehealth: Payer: Self-pay | Admitting: Physician Assistant

## 2019-02-03 NOTE — Telephone Encounter (Signed)
Patient called stating that her sugars are low reports that it was 50 on Friday and was 70 on Saturday. Patient also reports bodily shakes. Please follow up.

## 2019-02-03 NOTE — Telephone Encounter (Signed)
Left message on voicemail to return call with  Assistance of Spanish interpreter- [place]real ID # [phone].

## 2019-02-03 NOTE — Telephone Encounter (Signed)
Route

## 2019-02-03 NOTE — Telephone Encounter (Signed)
Patient called back due to missed call states she talked to a nurse that spoke spanish. Please follow up.

## 2019-02-03 NOTE — Telephone Encounter (Signed)
Patients call returned.  Patient identified by name and date of birth.  Patient states blood sugars are low for her 60-90 with last nights cbg was 112.  Patient states she feels shaky and was awoke last night and could not go back to sleep.  Patient was asked about diet and from what the patient describe the patient is not eating enough.  As well the patient is not eating enough nutrient dense food.   Patient advised on what and how often to eat and told to follow that regiment and call back in two days.  Patient acknowledged understanding of advice.

## 2019-02-04 ENCOUNTER — Ambulatory Visit: Payer: Self-pay | Attending: Primary Care | Admitting: Primary Care

## 2019-02-04 ENCOUNTER — Other Ambulatory Visit: Payer: Self-pay

## 2019-02-04 ENCOUNTER — Encounter: Payer: Self-pay | Admitting: Primary Care

## 2019-02-04 DIAGNOSIS — I10 Essential (primary) hypertension: Secondary | ICD-10-CM

## 2019-02-04 DIAGNOSIS — E119 Type 2 diabetes mellitus without complications: Secondary | ICD-10-CM

## 2019-02-04 DIAGNOSIS — E7841 Elevated Lipoprotein(a): Secondary | ICD-10-CM

## 2019-02-04 DIAGNOSIS — E0800 Diabetes mellitus due to underlying condition with hyperosmolarity without nonketotic hyperglycemic-hyperosmolar coma (NKHHC): Secondary | ICD-10-CM

## 2019-02-04 DIAGNOSIS — Z789 Other specified health status: Secondary | ICD-10-CM

## 2019-02-04 MED ORDER — TRUEPLUS LANCETS 30G MISC: 1.0000 | Freq: Once | 11 refills | Status: AC

## 2019-02-04 MED FILL — TRUEplus LANCETS 28G MISC: 25 days supply | Qty: 100 | Fill #0

## 2019-02-04 NOTE — Progress Notes (Signed)
Blood sugar this morning was 119  Pt states a few days ago she started to feel bad.   Pt states she is not eating to much

## 2019-02-04 NOTE — Progress Notes (Signed)
Virtual Visit via Telephone Note  I connected with [name] Perry on 02/04/19 at 10:10 AM EDT by telephone and verified that I am speaking with the correct person using two identifiers.   I discussed the limitations, risks, security and privacy concerns of performing an evaluation and management service by telephone and the availability of in person appointments. I also discussed with the patient that there may be a patient responsible charge related to this service. The patient expressed understanding and agreed to proceed.   History of Present Illness: Ms. [name] Perry is concerned about blood sugars , she has been feeling shaky , h/a and takes her CBG 3-4 times a day <200. Patient will have a lab visit discussed do not take medication without eating first.   Observations/Objective: Review of Systems  HENT: Negative.   Eyes: Negative.   Respiratory: Negative.   Cardiovascular: Negative.   Gastrointestinal: Positive for nausea.  Genitourinary: Negative.   Musculoskeletal:       Right shin pain denies swelling, redness   Skin: Negative.   Neurological: Positive for weakness and headaches.  Endo/Heme/Allergies: Negative.   Psychiatric/Behavioral: The patient is nervous/anxious.     Assessment and Plan: Diagnoses and all orders for this visit:  Elevated lipoprotein(a) -     Lipid panel; Future  Diabetes mellitus due to underlying condition with hyperosmolarity without coma, without long-term current use of insulin (HCC) -     Hemoglobin A1c; Future -     Comprehensive metabolic panel; Future -     TRUEplus Lancets 30G MISC; Inject 1 each into the skin once for 1 dose.  Language barrier interpretor used   Essential hypertension Unable to ck Bp when she comes in for her lab visit Bp will be taken  Type 2 diabetes mellitus without complication, without long-term current use of insulin (HCC) -     TRUEplus Lancets 30G MISC; Inject 1 each into the skin once for 1  dose.    Follow Up Instructions:    I discussed the assessment and treatment plan with the patient. The patient was provided an opportunity to ask questions and all were answered. The patient agreed with the plan and demonstrated an understanding of the instructions.   The patient was advised to call back or seek an in-person evaluation if the symptoms worsen or if the condition fails to improve as anticipated.  I provided 40 minutes of non-face-to-face time during this encounter.   Grayce Sessions, NP

## 2019-02-06 ENCOUNTER — Ambulatory Visit: Payer: Self-pay | Attending: Family Medicine

## 2019-02-06 ENCOUNTER — Other Ambulatory Visit: Payer: Self-pay

## 2019-02-06 DIAGNOSIS — E7841 Elevated Lipoprotein(a): Secondary | ICD-10-CM

## 2019-02-06 DIAGNOSIS — E0800 Diabetes mellitus due to underlying condition with hyperosmolarity without nonketotic hyperglycemic-hyperosmolar coma (NKHHC): Secondary | ICD-10-CM

## 2019-02-06 DIAGNOSIS — Z01419 Encounter for gynecological examination (general) (routine) without abnormal findings: Secondary | ICD-10-CM

## 2019-02-07 LAB — COMPREHENSIVE METABOLIC PANEL
ALT: 24 IU/L (ref 0–32)
AST: 17 IU/L (ref 0–40)
Albumin/Globulin Ratio: 1.6 (ref 1.2–2.2)
Albumin: 4.4 g/dL (ref 3.8–4.8)
Alkaline Phosphatase: 77 IU/L (ref 39–117)
BUN/Creatinine Ratio: 20 (ref 9–23)
BUN: 14 mg/dL (ref 6–24)
Bilirubin Total: 0.6 mg/dL (ref 0.0–1.2)
CO2: 23 mmol/L (ref 20–29)
Calcium: 9.7 mg/dL (ref 8.7–10.2)
Chloride: 100 mmol/L (ref 96–106)
Creatinine, Ser: 0.7 mg/dL (ref 0.57–1.00)
GFR calc Af Amer: 124 mL/min/{1.73_m2} (ref >59)
GFR calc non Af Amer: 108 mL/min/{1.73_m2} (ref >59)
Globulin, Total: 2.7 g/dL (ref 1.5–4.5)
Glucose: 100 mg/dL — ABNORMAL HIGH (ref 65–99)
Potassium: 4.1 mmol/L (ref 3.5–5.2)
Sodium: 139 mmol/L (ref 134–144)
Total Protein: 7.1 g/dL (ref 6.0–8.5)

## 2019-02-07 LAB — LIPID PANEL
Chol/HDL Ratio: 3.8 ratio (ref 0.0–4.4)
Cholesterol, Total: 184 mg/dL (ref 100–199)
HDL: 48 mg/dL (ref >39)
LDL Calculated: 116 mg/dL — ABNORMAL HIGH (ref 0–99)
Triglycerides: 99 mg/dL (ref 0–149)
VLDL Cholesterol Cal: 20 mg/dL (ref 5–40)

## 2019-02-07 LAB — HEMOGLOBIN A1C
Est. average glucose Bld gHb Est-mCnc: 114 mg/dL
Hgb A1c MFr Bld: 5.6 % (ref 4.8–5.6)

## 2019-02-09 ENCOUNTER — Other Ambulatory Visit: Payer: Self-pay

## 2019-02-09 ENCOUNTER — Encounter: Payer: Self-pay | Admitting: Primary Care

## 2019-02-09 ENCOUNTER — Ambulatory Visit: Payer: Self-pay | Attending: Physician Assistant | Admitting: Primary Care

## 2019-02-09 DIAGNOSIS — E0800 Diabetes mellitus due to underlying condition with hyperosmolarity without nonketotic hyperglycemic-hyperosmolar coma (NKHHC): Secondary | ICD-10-CM

## 2019-02-09 DIAGNOSIS — Z7189 Other specified counseling: Secondary | ICD-10-CM

## 2019-02-09 DIAGNOSIS — E119 Type 2 diabetes mellitus without complications: Secondary | ICD-10-CM

## 2019-02-09 DIAGNOSIS — Z789 Other specified health status: Secondary | ICD-10-CM

## 2019-02-09 MED ORDER — LISINOPRIL 5 MG PO TABS: 5.0000 mg | ORAL_TABLET | Freq: Every day | ORAL | 3 refills | Status: DC

## 2019-02-09 MED ORDER — ATORVASTATIN CALCIUM 10 MG PO TABS: 10.0000 mg | ORAL_TABLET | Freq: Every day | ORAL | 3 refills | Status: DC

## 2019-02-09 MED FILL — ATORVASTATIN 10 MG TABLET: 10 | 30 days supply | Qty: 30 | Fill #0

## 2019-02-09 MED FILL — LISINOPRIL 5 MG TAB: 5 | 30 days supply | Qty: 30 | Fill #0

## 2019-02-09 NOTE — Progress Notes (Signed)
Virtual Visit via Telephone Note  I connected with [name] Perry on 02/09/19 at  9:42 AM EDT by telephone and verified that I am speaking with the correct person using two identifiers.   I discussed the limitations, risks, security and privacy concerns of performing an evaluation and management service by telephone and the availability of in person appointments. I also discussed with the patient that there may be a patient responsible charge related to this service. The patient expressed understanding and agreed to proceed.   History of Present Illness: [name] Perry  is being seen to review labs states she is monitoring what she is eating. Patient is continuing to breast feed. Explained when she stopped. I would like to place her on a Bp meds to protect her kidneys and a chol medication to reduce risk of stokes.   Observations/Objective: Review of Systems  Constitutional: Negative.   HENT: Negative.   Eyes: Negative.   Respiratory: Negative.   Cardiovascular: Negative.   Gastrointestinal: Negative.   Genitourinary: Negative.   Musculoskeletal: Negative.   Skin: Negative.   Neurological: Negative.   Endo/Heme/Allergies: Negative.   Psychiatric/Behavioral: Negative.     Assessment and Plan: Diagnoses and all orders for this visit:  Diabetes mellitus due to underlying condition with hyperosmolarity without coma, without long-term current use of insulin (HCC) Last A1C 5.6 unremarkable adjust medication metformin 518m QAM cont Amaryl 235mQAM F/u repeat A1C  Language barrier interpretor used  Encounter for medication review and counseling And labs with changes originally was adding Ace for renal protection and statin for lowering risk of CAD however she is still breast feeding   Breast feeding status of mother No medication added   Other orders -     Cancel: CBC with Differential/Platelet; Future -     Cancel: CMP14+EGFR -     Cancel: Hemoglobin A1c; Future -     Cancel:  Lipid panel; Future -     Discontinue: atorvastatin (LIPITOR) 10 MG tablet; Take 1 tablet (10 mg total) by mouth daily. -     Discontinue: lisinopril (ZESTRIL) 5 MG tablet; Take 1 tablet (5 mg total) by mouth daily.    Follow Up Instructions:    I discussed the assessment and treatment plan with the patient. The patient was provided an opportunity to ask questions and all were answered. The patient agreed with the plan and demonstrated an understanding of the instructions.   The patient was advised to call back or seek an in-person evaluation if the symptoms worsen or if the condition fails to improve as anticipated.  I provided 20 minutes of non-face-to-face time during this encounter.   MiKerin PernaNP

## 2019-02-09 NOTE — Progress Notes (Signed)
Patient verified DOB Patient has not taken medication. Patient has eaten and will now take metformin. Patient CBG was 115 this morning before she ate. Patient denies pain this time.

## 2019-02-10 MED ORDER — METFORMIN HCL ER 500 MG PO TB24: 500.0000 mg | ORAL_TABLET | Freq: Every day | ORAL | 3 refills | Status: DC

## 2019-02-13 ENCOUNTER — Ambulatory Visit: Payer: Self-pay | Attending: Family Medicine

## 2019-02-13 ENCOUNTER — Other Ambulatory Visit: Payer: Self-pay

## 2019-02-20 ENCOUNTER — Ambulatory Visit: Payer: Self-pay | Admitting: Primary Care

## 2019-03-30 MED FILL — TRUEplus LANCETS 28G MISC: 25 days supply | Qty: 100 | Fill #1

## 2019-03-30 MED FILL — TRUE METRIX TEST STRIP: 30 days supply | Qty: 100 | Fill #3

## 2019-04-09 ENCOUNTER — Encounter (INDEPENDENT_AMBULATORY_CARE_PROVIDER_SITE_OTHER): Payer: Self-pay | Admitting: Primary Care

## 2019-04-09 ENCOUNTER — Other Ambulatory Visit: Payer: Self-pay

## 2019-04-09 NOTE — Progress Notes (Signed)
Virtual Visit via Telephone Note  I connected with Clyda Greener on 04/09/19 at 10:30 AM EDT by telephone and verified that I am speaking with the correct person using two identifiers.   I discussed the limitations, risks, security and privacy concerns of performing an evaluation and management service by telephone and the availability of in person appointments. I also discussed with the patient that there may be a patient responsible charge related to this service. The patient expressed understanding and agreed to proceed.   History of Present Illness:    Observations/Objective: Review of Systems  All other systems reviewed and are negative.   Assessment and Plan:   Follow Up Instructions:    I discussed the assessment and treatment plan with the patient. The patient was provided an opportunity to ask questions and all were answered. The patient agreed with the plan and demonstrated an understanding of the instructions.   The patient was advised to call back or seek an in-person evaluation if the symptoms worsen or if the condition fails to improve as anticipated.  I provided 0 minutes of non-face-to-face time during this encounter.   Kerin Perna, NP   This encounter was created in error - please disregard.

## 2019-04-14 ENCOUNTER — Other Ambulatory Visit: Payer: Self-pay

## 2019-04-14 ENCOUNTER — Ambulatory Visit: Payer: Self-pay | Attending: Family Medicine

## 2019-04-29 ENCOUNTER — Ambulatory Visit (INDEPENDENT_AMBULATORY_CARE_PROVIDER_SITE_OTHER): Payer: Self-pay | Admitting: Primary Care

## 2019-04-29 ENCOUNTER — Other Ambulatory Visit: Payer: Self-pay

## 2019-04-29 ENCOUNTER — Encounter (INDEPENDENT_AMBULATORY_CARE_PROVIDER_SITE_OTHER): Payer: Self-pay | Admitting: Primary Care

## 2019-04-29 VITALS — Ht 65.0 in

## 2019-04-29 DIAGNOSIS — X503XXA Overexertion from repetitive movements, initial encounter: Secondary | ICD-10-CM

## 2019-04-29 DIAGNOSIS — K219 Gastro-esophageal reflux disease without esophagitis: Secondary | ICD-10-CM

## 2019-04-29 DIAGNOSIS — S46912A Strain of unspecified muscle, fascia and tendon at shoulder and upper arm level, left arm, initial encounter: Secondary | ICD-10-CM

## 2019-04-29 MED ORDER — OMEPRAZOLE 20 MG PO CPDR: 20.0000 mg | DELAYED_RELEASE_CAPSULE | Freq: Every day | ORAL | 3 refills | Status: AC

## 2019-04-29 MED ORDER — IBUPROFEN 600 MG PO TABS: 600.0000 mg | ORAL_TABLET | Freq: Three times a day (TID) | ORAL | 0 refills | Status: DC | PRN

## 2019-04-29 MED FILL — IBUPROFEN 600 MG TABLET: 600 | 30 days supply | Qty: 90 | Fill #0

## 2019-04-29 MED FILL — OMEPRAZOLE 20 MG CAP: 20 | 30 days supply | Qty: 30 | Fill #0

## 2019-04-29 NOTE — Progress Notes (Signed)
Pt complains of back pain that extends towards chest  Pain comes and goes  Onset of pain 1 week ago  Pt had gallbladder removed 2 months ago- still has pain near scar. Pain comes and goes as well

## 2019-04-29 NOTE — Progress Notes (Signed)
Virtual Visit via Telephone Note  I connected with [name] Perry on 04/29/19 at  2:30 PM EDT by telephone and verified that I am speaking with the correct person using two identifiers.   I discussed the limitations, risks, security and privacy concerns of performing an evaluation and management service by telephone and the availability of in person appointments. I also discussed with the patient that there may be a patient responsible charge related to this service. The patient expressed understanding and agreed to proceed.  Tele visit  History of Present Illness: Ms. [name] Perry is being seen today for complaints of chest pain which comes and goes she denies eating, or citric but pain occurs when she has eaten and full.    Past Medical History:  Diagnosis Date  . Anxiety   . Ectopic pregnancy march  . Gestational diabetes    glyburide  . Heart palpitations    anxiety related  . Hypertension 2010   Observations/Objective: Review of Systems  Gastrointestinal: Positive for abdominal pain and heartburn.  Genitourinary: Positive for flank pain.  Musculoskeletal:       Muscle strain child is 20 lbs feels a strain     Assessment and Plan: [name] Perry was seen today for establish care.  Diagnoses and all orders for this visit:  Gastroesophageal reflux disease without esophagitis Discussed eating small frequent meal, reduction in acidic foods, fried foods ,spicy foods, alcohol caffeine and tobacco and certain medications. Avoid laying down after eating 61mins-1hour, elevated head of the bed.  Muscle strain of upper extremity, left, initial encounter Associated with lifting and doing repetitive motion   Follow Up Instructions:    I discussed the assessment and treatment plan with the patient. The patient was provided an opportunity to ask questions and all were answered. The patient agreed with the plan and demonstrated an understanding of the instructions.   The patient  was advised to call back or seek an in-person evaluation if the symptoms worsen or if the condition fails to improve as anticipated.  I provided 18 minutes of non-face-to-face time during this encounter.   Kerin Perna, NP

## 2019-05-13 ENCOUNTER — Other Ambulatory Visit: Payer: Self-pay

## 2019-05-13 ENCOUNTER — Ambulatory Visit: Payer: Self-pay | Attending: Family Medicine

## 2019-05-27 ENCOUNTER — Ambulatory Visit (INDEPENDENT_AMBULATORY_CARE_PROVIDER_SITE_OTHER): Payer: Self-pay | Admitting: Primary Care

## 2019-06-12 ENCOUNTER — Ambulatory Visit (INDEPENDENT_AMBULATORY_CARE_PROVIDER_SITE_OTHER): Payer: Self-pay | Admitting: Primary Care

## 2019-06-12 ENCOUNTER — Other Ambulatory Visit: Payer: Self-pay

## 2019-06-12 DIAGNOSIS — E119 Type 2 diabetes mellitus without complications: Secondary | ICD-10-CM

## 2019-06-12 DIAGNOSIS — K219 Gastro-esophageal reflux disease without esophagitis: Secondary | ICD-10-CM

## 2019-06-15 NOTE — Progress Notes (Signed)
Established Patient Office Visit  Subjective:  Patient ID: [name] Perry, female    DOB: 1975/11/17  Age: 43 y.o. MRN: [id number]  CC:  Chief Complaint  Patient presents with  . Diabetes    HPI [name] Perry presents for the management of type 2 diabetes. Previous A1C was 5.3 08/12/2019 and agreed to control her diabetes with diet and exercise.  Past Medical History:  Diagnosis Date  . Anxiety   . Ectopic pregnancy march  . Gestational diabetes    glyburide  . Heart palpitations    anxiety related  . Hypertension 2010    Past Surgical History:  Procedure Laterality Date  . HERNIA REPAIR    . INSERTION OF MESH N/A 01/11/2014   Procedure: INSERTION OF MESH;  Surgeon: Ralene Ok, MD;  Location: [place];  Service: General;  Laterality: N/A;  . UMBILICAL HERNIA REPAIR N/A 01/11/2014   Procedure: LAPAROSCOPIC UMBILICAL HERNIA;  Surgeon: Ralene Ok, MD;  Location: [facility] [facility];  Service: General;  Laterality: N/A;    Family History  Problem Relation Age of Onset  . Cirrhosis Mother        Not alcoholic--not clear of etiology  . Ulcers Mother   . Alcohol abuse Father     Social History   Socioeconomic History  . Marital status: Married    Spouse name: Not on file  . Number of children: 3  . Years of education: Not on file  . Highest education level: Not on file  Occupational History  . Occupation: Ironing in Teacher, early years/pre  Social Needs  . Financial resource strain: Not on file  . Food insecurity    Worry: Not on file    Inability: Not on file  . Transportation needs    Medical: Not on file    Non-medical: Not on file  Tobacco Use  . Smoking status: Never Smoker  . Smokeless tobacco: Never Used  Substance and Sexual Activity  . Alcohol use: No  . Drug use: No  . Sexual activity: Yes  Lifestyle  . Physical activity    Days per week: Not on file    Minutes per session: Not on file  . Stress: Not on file  Relationships  . Social Product manager on phone: Not on file    Gets together: Not on file    Attends religious service: Not on file    Active member of club or organization: Not on file    Attends meetings of clubs or organizations: Not on file    Relationship status: Not on file  . Intimate partner violence    Fear of current or ex partner: Not on file    Emotionally abused: Not on file    Physically abused: Not on file    Forced sexual activity: Not on file  Other Topics Concern  . Not on file  Social History Narrative   Originally from [place], [place]   In [place]. Since 2005   Lives with husband and 3 children   Children:  Ages 63, 15, 5 months.    Outpatient Medications Prior to Visit  Medication Sig Dispense Refill  . ibuprofen (ADVIL) 600 MG tablet Take 1 tablet (600 mg total) by mouth every 8 (eight) hours as needed for moderate pain. 90 tablet 0  . omeprazole (PRILOSEC) 20 MG capsule Take 1 capsule (20 mg total) by mouth daily. 30 capsule 3  . blood glucose meter kit and supplies KIT Dispense based on patient and  insurance preference. Use up to four times daily as directed. (FOR ICD-9 250.00, 250.01). (Patient not taking: Reported on 04/29/2019) 1 each 0  . glimepiride (AMARYL) 2 MG tablet Take 1 tablet (2 mg total) by mouth daily before breakfast. (Patient not taking: Reported on 04/09/2019) 30 tablet 11  . glucose blood (TRUE METRIX BLOOD GLUCOSE TEST) test strip Use three times a day. (Patient not taking: Reported on 04/29/2019) 100 each 11   No facility-administered medications prior to visit.     No Known Allergies  ROS Review of Systems  All other systems reviewed and are negative.     Objective:    Physical Exam  Constitutional: She is oriented to person, place, and time. Vital signs are normal. She appears well-developed and well-nourished.  Eyes: Pupils are equal, round, and reactive to light. EOM are normal.  Neck: Normal range of motion.  Cardiovascular: Normal rate and regular rhythm.   Pulmonary/Chest: Effort normal and breath sounds normal.  Abdominal: Soft. Bowel sounds are normal.  Musculoskeletal: Normal range of motion.  Neurological: She is oriented to person, place, and time.  Skin: Skin is warm and dry.  Psychiatric: She has a normal mood and affect.    There were no vitals taken for this visit. Wt Readings from Last 3 Encounters:  08/11/18 138 lb 3.2 oz (62.7 kg)  05/22/18 142 lb 9.6 oz (64.7 kg)  04/17/18 142 lb (64.4 kg)     Health Maintenance Due  Topic Date Due  . PNEUMOCOCCAL POLYSACCHARIDE VACCINE AGE 66-64 HIGH RISK  03/12/1979  . OPHTHALMOLOGY EXAM  03/12/1987  . PAP SMEAR-Modifier  06/02/2018  . INFLUENZA VACCINE  04/25/2019  . URINE MICROALBUMIN  05/23/2019    There are no preventive care reminders to display for this patient.  Lab Results  Component Value Date   TSH 1.880 04/17/2018   Lab Results  Component Value Date   WBC 7.0 04/17/2018   HGB 14.1 04/17/2018   HCT 41.8 04/17/2018   MCV 84 04/17/2018   PLT 297 04/17/2018   Lab Results  Component Value Date   NA 139 02/06/2019   K 4.1 02/06/2019   CO2 23 02/06/2019   GLUCOSE 100 (H) 02/06/2019   BUN 14 02/06/2019   CREATININE 0.70 02/06/2019   BILITOT 0.6 02/06/2019   ALKPHOS 77 02/06/2019   AST 17 02/06/2019   ALT 24 02/06/2019   PROT 7.1 02/06/2019   ALBUMIN 4.4 02/06/2019   CALCIUM 9.7 02/06/2019   ANIONGAP 7 05/06/2017   Lab Results  Component Value Date   CHOL 184 02/06/2019   Lab Results  Component Value Date   HDL 48 02/06/2019   Lab Results  Component Value Date   LDLCALC 116 (H) 02/06/2019   Lab Results  Component Value Date   TRIG 99 02/06/2019   Lab Results  Component Value Date   CHOLHDL 3.8 02/06/2019   Lab Results  Component Value Date   HGBA1C 5.6 02/06/2019      Assessment & Plan:   [name] Perry was seen today for diabetes.  Diagnoses and all orders for this visit:  Type 2 diabetes mellitus without complication, without  long-term current use of insulin ([place]) Diabetes well controled with diet and exercise . She is no longer taking any oral agents for diabetes. Follow up 6 months will re-evaluate A1C  Encounter for diabetic foot exam ([place]) Sensory exam of the foot is normal, tested with the monofilament. Good pulses, no lesions or ulcers, good peripheral pulses.  Gastroesophageal reflux disease without esophagitis Discussed eating small frequent meal, reduction in acidic foods, fried foods ,spicy foods, alcohol caffeine and tobacco and certain medications. Avoid laying down after eating 45mns-1hour, elevated head of the bed.  No orders of the defined types were placed in this encounter.   Follow-up: Return in about 6 months (around 12/10/2019) for Check A1C.    MKerin Perna NP

## 2019-07-10 ENCOUNTER — Other Ambulatory Visit: Payer: Self-pay

## 2019-07-10 ENCOUNTER — Encounter (INDEPENDENT_AMBULATORY_CARE_PROVIDER_SITE_OTHER): Payer: Self-pay | Admitting: Primary Care

## 2019-07-10 ENCOUNTER — Ambulatory Visit (INDEPENDENT_AMBULATORY_CARE_PROVIDER_SITE_OTHER): Payer: Self-pay | Admitting: Primary Care

## 2019-07-10 VITALS — BP 120/82 | HR 72 | Temp 97.5°F | Ht 65.0 in | Wt 155.2 lb

## 2019-07-10 DIAGNOSIS — Z124 Encounter for screening for malignant neoplasm of cervix: Secondary | ICD-10-CM

## 2019-07-10 DIAGNOSIS — E119 Type 2 diabetes mellitus without complications: Secondary | ICD-10-CM

## 2019-07-10 DIAGNOSIS — Z23 Encounter for immunization: Secondary | ICD-10-CM

## 2019-07-10 DIAGNOSIS — R739 Hyperglycemia, unspecified: Secondary | ICD-10-CM

## 2019-07-10 LAB — POCT GLYCOSYLATED HEMOGLOBIN (HGB A1C): Hemoglobin A1C: 6.1 % — AB (ref 4.0–5.6)

## 2019-07-10 NOTE — Progress Notes (Signed)
Established Patient Office Visit  Subjective:  Patient ID: [name] Perry, female    DOB: [birth year]  Age: 43 y.o. MRN: [id number]  CC:  Chief Complaint  Patient presents with  . Follow-up    HTN/prediabetes     HPI [name] Perry presents for follow up on Diabetes last visit discontinue medication. Blood pressure 120/82 a goal. She denies  shortness of breath, headaches, chest pain or lower extremity edema  Past Medical History:  Diagnosis Date  . Anxiety   . Ectopic pregnancy march  . Gestational diabetes    glyburide  . Heart palpitations    anxiety related  . Hypertension 2010    Past Surgical History:  Procedure Laterality Date  . HERNIA REPAIR    . INSERTION OF MESH N/A 01/11/2014   Procedure: INSERTION OF MESH;  Surgeon: Ralene Ok, MD;  Location: [place] [facility];  Service: General;  Laterality: N/A;  . UMBILICAL HERNIA REPAIR N/A 01/11/2014   Procedure: LAPAROSCOPIC UMBILICAL HERNIA;  Surgeon: Ralene Ok, MD;  Location: [facility];  Service: General;  Laterality: N/A;    Family History  Problem Relation Age of Onset  . Cirrhosis Mother        Not alcoholic--not clear of etiology  . Ulcers Mother   . Alcohol abuse Father     Social History   Socioeconomic History  . Marital status: Married    Spouse name: Not on file  . Number of children: 3  . Years of education: Not on file  . Highest education level: Not on file  Occupational History  . Occupation: Ironing in Teacher, early years/pre  Social Needs  . Financial resource strain: Not on file  . Food insecurity    Worry: Not on file    Inability: Not on file  . Transportation needs    Medical: Not on file    Non-medical: Not on file  Tobacco Use  . Smoking status: Never Smoker  . Smokeless tobacco: Never Used  Substance and Sexual Activity  . Alcohol use: No  . Drug use: No  . Sexual activity: Yes  Lifestyle  . Physical activity    Days per week: Not on file    Minutes per session: Not on file   . Stress: Not on file  Relationships  . Social Herbalist on phone: Not on file    Gets together: Not on file    Attends religious service: Not on file    Active member of club or organization: Not on file    Attends meetings of clubs or organizations: Not on file    Relationship status: Not on file  . Intimate partner violence    Fear of current or ex partner: Not on file    Emotionally abused: Not on file    Physically abused: Not on file    Forced sexual activity: Not on file  Other Topics Concern  . Not on file  Social History Narrative   Originally from [place], [place]   In [place]. Since 2005   Lives with husband and 3 children   Children:  Ages 74, 6, 5 months.    Outpatient Medications Prior to Visit  Medication Sig Dispense Refill  . blood glucose meter kit and supplies KIT Dispense based on patient and insurance preference. Use up to four times daily as directed. (FOR ICD-9 250.00, 250.01). (Patient not taking: Reported on 04/29/2019) 1 each 0  . glimepiride (AMARYL) 2 MG tablet Take 1 tablet (2 mg total)  by mouth daily before breakfast. (Patient not taking: Reported on 04/09/2019) 30 tablet 11  . glucose blood (TRUE METRIX BLOOD GLUCOSE TEST) test strip Use three times a day. (Patient not taking: Reported on 04/29/2019) 100 each 11  . omeprazole (PRILOSEC) 20 MG capsule Take 1 capsule (20 mg total) by mouth daily. (Patient not taking: Reported on 07/10/2019) 30 capsule 3  . ibuprofen (ADVIL) 600 MG tablet Take 1 tablet (600 mg total) by mouth every 8 (eight) hours as needed for moderate pain. 90 tablet 0   No facility-administered medications prior to visit.     No Known Allergies  ROS Review of Systems  All other systems reviewed and are negative.     Objective:    Physical Exam  Constitutional: She is oriented to person, place, and time. She appears well-developed and well-nourished.  HENT:  Head: Normocephalic.  Neck: Normal range of motion. Neck  supple.  Cardiovascular: Normal rate and regular rhythm.  Pulmonary/Chest: Effort normal and breath sounds normal.  Abdominal: Soft. Bowel sounds are normal.  Musculoskeletal: Normal range of motion.  Neurological: She is oriented to person, place, and time.  Skin: Skin is warm.  Psychiatric: She has a normal mood and affect.    BP 120/82 (BP Location: Right Arm, Patient Position: Sitting, Cuff Size: Normal)   Pulse 72   Temp (!) 97.5 F (36.4 C) (Temporal)   Ht 5' 5" (1.651 m)   Wt 155 lb 3.2 oz (70.4 kg)   SpO2 99%   BMI 25.83 kg/m  Wt Readings from Last 3 Encounters:  07/10/19 155 lb 3.2 oz (70.4 kg)  08/11/18 138 lb 3.2 oz (62.7 kg)  05/22/18 142 lb 9.6 oz (64.7 kg)     Health Maintenance Due  Topic Date Due  . OPHTHALMOLOGY EXAM  03/12/1987  . PAP SMEAR-Modifier  06/02/2018    There are no preventive care reminders to display for this patient.  Lab Results  Component Value Date   TSH 1.880 04/17/2018   Lab Results  Component Value Date   WBC 7.9 07/10/2019   HGB 13.8 07/10/2019   HCT 41.2 07/10/2019   MCV 87 07/10/2019   PLT 294 07/10/2019   Lab Results  Component Value Date   NA 139 07/10/2019   K 4.2 07/10/2019   CO2 23 07/10/2019   GLUCOSE 113 (H) 07/10/2019   BUN 12 07/10/2019   CREATININE 0.75 07/10/2019   BILITOT 0.6 07/10/2019   ALKPHOS 80 07/10/2019   AST 21 07/10/2019   ALT 26 07/10/2019   PROT 7.6 07/10/2019   ALBUMIN 4.4 07/10/2019   CALCIUM 9.4 07/10/2019   ANIONGAP 7 05/06/2017   Lab Results  Component Value Date   CHOL 174 07/10/2019   Lab Results  Component Value Date   HDL 36 (L) 07/10/2019   Lab Results  Component Value Date   LDLCALC 74 07/10/2019   Lab Results  Component Value Date   TRIG 407 (H) 07/10/2019   Lab Results  Component Value Date   CHOLHDL 4.8 (H) 07/10/2019   Lab Results  Component Value Date   HGBA1C 6.1 (A) 07/10/2019      Assessment & Plan:  [name] Perry was seen today for  follow-up.  Diagnoses and all orders for this visit:  Type 2 diabetes mellitus without complication, without long-term current use of insulin (HCC) Controlled on diet modification and exercise -     Ambulatory referral to Ophthalmology -     CBC with Differential -  Lipid Panel -     CMP14+EGFR  Hyperglycemia -     HgB A1c -     Microalbumin/Creatinine Ratio, Urine  Cervical cancer screening Patient completed application for BCCP while in clinic and application has and faxed to [facility]. Patient aware that [facility] will contact her directly to schedule appointment.  Need for prophylactic vaccination and inoculation against influenza Completed at [facility]   Other orders -     Pneumococcal polysaccharide vaccine 23-valent greater than or equal to 2yo subcutaneous/IM    No orders of the defined types were placed in this encounter.   Follow-up: Return in about 6 months (around 01/08/2020) for follow up DM in person .    Kerin Perna, NP

## 2019-07-10 NOTE — Patient Instructions (Signed)
Diabetes mellitus y actividad física °Diabetes Mellitus and Exercise °Hacer actividad física habitualmente es importante para el estado de salud general, en especial si tiene diabetes (diabetes mellitus). La actividad física no solo se reduce a bajar de peso. Aporta muchos beneficios para la salud, como aumento de la fuerza muscular y la densidad ósea, y reducción de las grasas corporales y el estrés. Esto mejora el estado físico, la flexibilidad y la resistencia, y todo ello redunda en un mejor estado de salud general. °La actividad física tiene beneficios adicionales para los diabéticos, entre ellos: °· Disminuye el apetito. °· Ayuda a bajar y mantener la glucemia bajo control. °· Baja la presión arterial. °· Ayuda a controlar las cantidades de sustancias grasas (lípidos) en la sangre, como el colesterol y los triglicéridos. °· Mejora la respuesta del cuerpo a la insulina (optimización de la sensibilidad a la insulina). °· Reduce la cantidad de insulina que el cuerpo necesita. °· Reduce el riesgo de sufrir cardiopatía coronaria de la siguiente forma: °? Baja los niveles de colesterol y triglicéridos. °? Aumenta los niveles de colesterol bueno. °? Disminuye la glucemia. °¿Cuál es mi plan de actividad? °El médico o un educador para la diabetes certificado pueden ayudarlo a elaborar un plan respecto del tipo y de la frecuencia de actividad física (plan de actividades) adecuado para usted. Asegúrese de lo siguiente: °· Haga por lo menos 150 minutos semanales de ejercicios de intensidad moderada o vigorosa. Estos podrían ser caminatas dinámicas, ciclismo o gimnasia acuática. °? Haga ejercicios de elongación y de fortalecimiento, como yoga o levantamiento de pesas, por lo menos 2 veces por semana. °? Reparta la actividad en al menos 3 días de la semana. °· Haga algún tipo de actividad física todos los días. °? No deje pasar más de 2 días seguidos sin hacer algún tipo de actividad física. °? Evite permanecer inactivo  durante más de 30 minutos seguidos. Tómese descansos frecuentes para caminar o estirarse. °· Elija un tipo de ejercicio o de actividad que disfrute y establezca objetivos realistas. °· Comience lentamente y aumente de manera gradual la intensidad del ejercicio con el correr del tiempo. °¿Qué debo saber acerca del control de la diabetes? ° °· Contrólese la glucemia antes y después de ejercitarse. °? Si la glucemia es de 240 mg/dl (13,3 mmol/l) o más antes de comenzar a hacer actividad física, controle la orina para detectar la presencia de cetonas. Si tiene cetonas en la orina, no haga ejercicio hasta que la glucemia se normalice. °? Si la glucemia es de 100 mg/dl (5.6 mmol/l) o menos, tome una colación que contenga entre 15 y 20 gramos de carbohidratos. Controle la glucemia 15 minutos después de la colación para asegurarse de que el nivel esté por encima de 100 mg/dl (5.6 mmol/l) antes de comenzar a hacer actividad física. °· Conozca los síntomas de la glucemia baja (hipoglucemia) y aprenda cómo tratarla. El riesgo de tener hipoglucemia aumenta durante y después de hacer actividad física. Los síntomas frecuentes de hipoglucemia pueden incluir los siguientes: °? Hambre. °? Ansiedad. °? Sudoración y piel húmeda. °? Confusión. °? Mareos o sensación de desvanecimiento. °? Aumento de la frecuencia cardíaca o palpitaciones. °? Visión borrosa. °? Hormigueo o adormecimiento alrededor de la boca, los labios o la lengua. °? Estremecimientos y temblores. °? Irritabilidad. °· Tenga una colación de carbohidratos de acción rápida disponible antes, durante y después de ejercitarse, a fin de evitar o tratar la hipoglucemia. °· Evite inyectarse insulina en las zonas del cuerpo que ejercitará. Por ejemplo, evite inyectarse insulina en: °? Los brazos,   si juega al tenis. °? Las piernas, si corre. °· Lleve registros de sus hábitos de actividad física. Esto puede ayudarlos a usted y al médico a adaptar el plan de control de la diabetes  según sea necesario. Escriba los siguientes datos: °? Los alimentos que consume antes y después de hacer actividad física. °? Los niveles de glucosa en la sangre antes y después de hacer ejericios. °? El tipo y cantidad de actividad física que realiza. °? Cuando se prevé que la insulina alcance su valor máximo, si usa insulina. No haga actividad física en los momentos en que insulina alcanza su valor máximo. °· Cuando comience un ejercicio o una actividad nuevos, trabaje con el médico para asegurarse de que la actividad sea segura para usted y para ajustar la insulina, los medicamentos o la ingesta de alimentos según sea necesario. °· Beba gran cantidad de agua mientras hace ejercicio para evitar la deshidratación o los golpes de calor. Beba suficiente líquido como para mantener la orina clara o de color amarillo pálido. °Resumen °· Hacer actividad física habitualmente es importante para el estado de salud general, en especial si tiene diabetes (diabetes mellitus). °· La actividad física aporta muchos beneficios para la salud, como aumentar la fuerza muscular y la densidad ósea, y reducir las grasas corporales y el estrés. °· El médico o un educador para la diabetes certificado pueden ayudarlo a elaborar un plan respecto del tipo y de la frecuencia de actividad física (plan de actividades) adecuado para usted. °· Cuando comience un ejercicio o una actividad nuevos, trabaje con el médico para asegurarse de que la actividad sea segura para usted y para ajustar la insulina, los medicamentos o la ingesta de alimentos según sea necesario. °Esta información no tiene como fin reemplazar el consejo del médico. Asegúrese de hacerle al médico cualquier pregunta que tenga. °Document Released: 09/30/2007 Document Revised: 07/08/2017 Document Reviewed: 02/20/2016 °Elsevier Patient Education © 2020 Elsevier Inc. ° °

## 2019-07-11 LAB — MICROALBUMIN / CREATININE URINE RATIO
Creatinine, Urine: 206.7 mg/dL
Microalb/Creat Ratio: 17 mg/g creat (ref 0–29)
Microalbumin, Urine: 34.3 ug/mL

## 2019-07-11 LAB — LIPID PANEL
Chol/HDL Ratio: 4.8 ratio — ABNORMAL HIGH (ref 0.0–4.4)
Cholesterol, Total: 174 mg/dL (ref 100–199)
HDL: 36 mg/dL — ABNORMAL LOW (ref >39)
LDL Chol Calc (NIH): 74 mg/dL (ref 0–99)
Triglycerides: 407 mg/dL — ABNORMAL HIGH (ref 0–149)
VLDL Cholesterol Cal: 64 mg/dL — ABNORMAL HIGH (ref 5–40)

## 2019-07-11 LAB — CMP14+EGFR
ALT: 26 IU/L (ref 0–32)
AST: 21 IU/L (ref 0–40)
Albumin/Globulin Ratio: 1.4 (ref 1.2–2.2)
Albumin: 4.4 g/dL (ref 3.8–4.8)
Alkaline Phosphatase: 80 IU/L (ref 39–117)
BUN/Creatinine Ratio: 16 (ref 9–23)
BUN: 12 mg/dL (ref 6–24)
Bilirubin Total: 0.6 mg/dL (ref 0.0–1.2)
CO2: 23 mmol/L (ref 20–29)
Calcium: 9.4 mg/dL (ref 8.7–10.2)
Chloride: 104 mmol/L (ref 96–106)
Creatinine, Ser: 0.75 mg/dL (ref 0.57–1.00)
GFR calc Af Amer: 114 mL/min/{1.73_m2} (ref >59)
GFR calc non Af Amer: 99 mL/min/{1.73_m2} (ref >59)
Globulin, Total: 3.2 g/dL (ref 1.5–4.5)
Glucose: 113 mg/dL — ABNORMAL HIGH (ref 65–99)
Potassium: 4.2 mmol/L (ref 3.5–5.2)
Sodium: 139 mmol/L (ref 134–144)
Total Protein: 7.6 g/dL (ref 6.0–8.5)

## 2019-07-11 LAB — CBC WITH DIFFERENTIAL/PLATELET
Basophils Absolute: 0.1 10*3/uL (ref 0.0–0.2)
Basos: 1 %
EOS (ABSOLUTE): 0.3 10*3/uL (ref 0.0–0.4)
Eos: 4 %
Hematocrit: 41.2 % (ref 34.0–46.6)
Hemoglobin: 13.8 g/dL (ref 11.1–15.9)
Immature Grans (Abs): 0 10*3/uL (ref 0.0–0.1)
Immature Granulocytes: 0 %
Lymphocytes Absolute: 2.8 10*3/uL (ref 0.7–3.1)
Lymphs: 35 %
MCH: 29.2 pg (ref 26.6–33.0)
MCHC: 33.5 g/dL (ref 31.5–35.7)
MCV: 87 fL (ref 79–97)
Monocytes Absolute: 0.4 10*3/uL (ref 0.1–0.9)
Monocytes: 4 %
Neutrophils Absolute: 4.4 10*3/uL (ref 1.4–7.0)
Neutrophils: 56 %
Platelets: 294 10*3/uL (ref 150–450)
RBC: 4.72 x10E6/uL (ref 3.77–5.28)
RDW: 13.6 % (ref 11.7–15.4)
WBC: 7.9 10*3/uL (ref 3.4–10.8)

## 2019-07-13 ENCOUNTER — Other Ambulatory Visit (INDEPENDENT_AMBULATORY_CARE_PROVIDER_SITE_OTHER): Payer: Self-pay | Admitting: Primary Care

## 2019-07-13 MED ORDER — ATORVASTATIN CALCIUM 40 MG PO TABS: 40.0000 mg | ORAL_TABLET | Freq: Every day | ORAL | 1 refills | Status: DC

## 2019-07-13 MED FILL — ?ATORVASTATIN 40MG TABLET: 40 | 30 days supply | Qty: 30 | Fill #0

## 2019-07-15 ENCOUNTER — Telehealth (INDEPENDENT_AMBULATORY_CARE_PROVIDER_SITE_OTHER): Payer: Self-pay

## 2019-07-15 NOTE — Telephone Encounter (Signed)
-----   Message from Kerin Perna, NP sent at 07/13/2019 11:00 AM EDT ----- I have reviewed all labs and they are normal. Except cholesterol elevated sent in atorvastin 40mg  decrease your fatty foods, red meat, cheese, milk and increase fiber like whole grains and veggies. You can also add a fiber supplement like Metamucil or Benefiber.

## 2019-07-15 NOTE — Telephone Encounter (Signed)
Call placed to patient using pacific interpreter [phone]) she is aware that labs are normal except elevated cholesterol, atorvastatin 40 mg sent to pharmacy to help lower cholesterol. Advised patient to decrease the amount of fatty foods, red meat, cheese and milk she consumes and increase fiber with whole grains and vegetables. Advised that she can also add a fiber supplement like metamucil or benefiber. Patient expressed understanding of results. No questions. Nat Christen, CMA

## 2019-07-31 ENCOUNTER — Other Ambulatory Visit (HOSPITAL_COMMUNITY)
Admission: RE | Admit: 2019-07-31 | Discharge: 2019-07-31 | Disposition: A | Discharge status: Home / Self Care | Payer: Self-pay | Source: Ambulatory Visit | Attending: Primary Care | Admitting: Primary Care

## 2019-07-31 ENCOUNTER — Ambulatory Visit (INDEPENDENT_AMBULATORY_CARE_PROVIDER_SITE_OTHER): Payer: Self-pay | Admitting: Primary Care

## 2019-07-31 ENCOUNTER — Other Ambulatory Visit: Payer: Self-pay

## 2019-07-31 ENCOUNTER — Encounter (INDEPENDENT_AMBULATORY_CARE_PROVIDER_SITE_OTHER): Payer: Self-pay | Admitting: Primary Care

## 2019-07-31 VITALS — BP 121/84 | HR 71 | Ht 65.0 in | Wt 158.4 lb

## 2019-07-31 DIAGNOSIS — Z01419 Encounter for gynecological examination (general) (routine) without abnormal findings: Secondary | ICD-10-CM

## 2019-07-31 DIAGNOSIS — Z1239 Encounter for other screening for malignant neoplasm of breast: Secondary | ICD-10-CM

## 2019-07-31 DIAGNOSIS — Z124 Encounter for screening for malignant neoplasm of cervix: Secondary | ICD-10-CM | POA: Insufficient documentation

## 2019-07-31 DIAGNOSIS — Z114 Encounter for screening for human immunodeficiency virus [HIV]: Secondary | ICD-10-CM

## 2019-07-31 NOTE — Patient Instructions (Signed)
The USPSTF recommendations screening of cervical cancer every 3 years with cervical cytology.  All women age 42 to 65 years are at risk for cervical cancer because of potential exposure to high risk HPV types to sexual intercourse and should be screened.  Certainly risk factors further increased risk for cervical cancer including HIV infection, a compromised immune system, and utero exposure to diethylstilbestrol and previous treatment of high-grade precancerous lesions or cervical cancer.  Women with these risk factors should receive individual follow-up 

## 2019-07-31 NOTE — Progress Notes (Signed)
Subjective:     [name] Perry is a 43 y.o. female and is here for a comprehensive physical exam. The patient reports no problems or concerns. Social History   Socioeconomic History  . Marital status: Married    Spouse name: Not on file  . Number of children: 3  . Years of education: Not on file  . Highest education level: Not on file  Occupational History  . 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 Financial resource strain: Not on file  . Food insecurity    Worry: Not on file    Inability: Not on file  . Transportation needs    Medical: Not on file    Non-medical: Not on file  Tobacco Use  . Smoking status: Never Smoker  . Smokeless tobacco: Never Used  Substance and Sexual Activity  . Alcohol use: No  . Drug use: No  . Sexual activity: Yes  Lifestyle  . Physical activity    Days per week: Not on file    Minutes per session: Not on file  . Stress: Not on file  Relationships  . Social Musician on phone: Not on file    Gets together: Not on file    Attends religious service: Not on file    Active member of club or organization: Not on file    Attends meetings of clubs or organizations: Not on file    Relationship status: Not on file  . Intimate partner violence    Fear of current or ex partner: Not on file    Emotionally abused: Not on file    Physically abused: Not on file    Forced sexual activity: Not on file  Other Topics Concern  . Not on file  Social History Narrative   [name]   [place]   Lives with husband and 3 children   Children:  Ages 77, 35, 5 months.   Health Maintenance  Topic Date Due  . OPHTHALMOLOGY EXAM  03/12/1987  . PAP SMEAR-Modifier  06/02/2018  . HEMOGLOBIN A1C  01/08/2020  . FOOT EXAM  06/11/2020  . URINE MICROALBUMIN  07/09/2020  . TETANUS/TDAP  09/19/2026  . INFLUENZA VACCINE  Completed  . PNEUMOCOCCAL POLYSACCHARIDE VACCINE AGE 2-64 HIGH RISK  Completed  . HIV  Screening  Completed      Review of Systems Review of Systems  All other systems reviewed and are negative.   Objective:     Assessment:    Healthy female exam.  CONSTITUTIONAL: Well-developed, nourished female in no acute distress. Thin frame HENT:  Normocephalic, atraumatic, External right and left ear normal.  EYES: Conjunctivae and EOM are normal. Pupils are equal, round, and reactive to light. No scleral icterus.  NECK: Normal range of motion, supple, no masses.  Normal thyroid.  SKIN: Skin is warm and dry. No rash noted. Not diaphoretic. No erythema. No pallor. NEUROLGIC: Alert and oriented to person, place, and time. Normal reflexes, muscle tone coordination. No cranial nerve deficit noted. PSYCHIATRIC: Normal mood and affect. Normal behavior. Normal judgment and thought content. CARDIOVASCULAR: Normal heart rate noted, regular rhythm RESPIRATORY: Clear to auscultation bilaterally. Effort and breath sounds normal, no problems with respiration noted. BREASTS: Taught SBE ABDOMEN: Soft, normal bowel sounds, no distention noted.  No tenderness, rebound or guarding.  PELVIC: Normal appearing external genitalia; normal appearing vaginal mucosa and cervix.  No abnormal discharge noted.  Pap smear obtained.  Normal uterine size,  no other palpable masses, no uterine or adnexal tenderness. MUSCULOSKELETAL: Normal range of motion. No tenderness.   Plan:     [name] was seen today for gynecologic exam and annual exam.  Diagnoses and all orders for this visit:  Encounter for screening for HIV -     HIV antibody (with reflex)  Screening for malignant neoplasm of cervix -     Cytology - PAP ([facility]) -     Cervicovaginal ancillary only  Breast screening Patient completed application for [facility] while in clinic and application has and faxed to [facility]. Patient aware that [facility] will contact her directly to schedule appointment.   See After Visit Summary for Counseling Recommendations

## 2019-08-01 LAB — HIV ANTIBODY (ROUTINE TESTING W REFLEX): HIV Screen 4th Generation wRfx: NONREACTIVE

## 2019-08-03 LAB — CYTOLOGY - PAP: Diagnosis: NEGATIVE

## 2019-08-04 ENCOUNTER — Other Ambulatory Visit (INDEPENDENT_AMBULATORY_CARE_PROVIDER_SITE_OTHER): Payer: Self-pay | Admitting: Primary Care

## 2019-08-04 LAB — CERVICOVAGINAL ANCILLARY ONLY
Bacterial Vaginitis (gardnerella): POSITIVE — AB
Candida Glabrata: NEGATIVE
Candida Vaginitis: NEGATIVE
Chlamydia: NEGATIVE
Comment: NEGATIVE
Comment: NEGATIVE
Comment: NEGATIVE
Comment: NEGATIVE
Comment: NEGATIVE
Comment: NORMAL
Neisseria Gonorrhea: NEGATIVE
Trichomonas: NEGATIVE

## 2019-08-04 MED ORDER — METRONIDAZOLE 500 MG PO TABS: 500.0000 mg | ORAL_TABLET | Freq: Two times a day (BID) | ORAL | 0 refills | Status: DC

## 2019-08-04 MED FILL — ?METRONIDAZOLE 500 MG TABS: 500 | 7 days supply | Qty: 14 | Fill #0

## 2019-08-07 ENCOUNTER — Telehealth (INDEPENDENT_AMBULATORY_CARE_PROVIDER_SITE_OTHER): Payer: Self-pay

## 2019-08-07 NOTE — Telephone Encounter (Signed)
Call placed using pacific interpreter [phone]) patient is aware that pap is normal,STD negative. BV is positive, metronidazole sent to [facility]. Take twice a day for 7 days and avoid alcohol while taking. Patient expressed understanding. Nat Christen, CMA

## 2019-08-07 NOTE — Telephone Encounter (Signed)
-----   Message from Kerin Perna, NP sent at 08/04/2019  3:21 PM EST ----- Dx BV a prescription for metronidazole.sent in.  You take this medication twice a day for 7 days. Be sure that you do not drink alcohol when you take this medication because the combination can give you severe nausea and vomiting. Negative for STD and HIV

## 2019-08-17 MED FILL — ?METRONIDAZOLE 500 MG TABS: 500 | 7 days supply | Qty: 14 | Fill #0

## 2019-08-26 ENCOUNTER — Other Ambulatory Visit (INDEPENDENT_AMBULATORY_CARE_PROVIDER_SITE_OTHER): Payer: Self-pay | Admitting: Primary Care

## 2019-08-26 DIAGNOSIS — Z124 Encounter for screening for malignant neoplasm of cervix: Secondary | ICD-10-CM

## 2019-08-26 DIAGNOSIS — Z1231 Encounter for screening mammogram for malignant neoplasm of breast: Secondary | ICD-10-CM

## 2019-10-20 ENCOUNTER — Ambulatory Visit (INDEPENDENT_AMBULATORY_CARE_PROVIDER_SITE_OTHER): Payer: Self-pay | Admitting: Primary Care

## 2019-10-20 ENCOUNTER — Encounter (INDEPENDENT_AMBULATORY_CARE_PROVIDER_SITE_OTHER): Payer: Self-pay | Admitting: Primary Care

## 2019-10-20 ENCOUNTER — Other Ambulatory Visit: Payer: Self-pay

## 2019-10-20 ENCOUNTER — Other Ambulatory Visit (HOSPITAL_COMMUNITY)
Admission: RE | Admit: 2019-10-20 | Discharge: 2019-10-20 | Disposition: A | Discharge status: Home / Self Care | Payer: Self-pay | Source: Ambulatory Visit | Attending: Primary Care | Admitting: Primary Care

## 2019-10-20 VITALS — BP 134/90 | HR 74 | Temp 97.3°F | Ht 65.0 in | Wt 155.4 lb

## 2019-10-20 DIAGNOSIS — B3731 Acute candidiasis of vulva and vagina: Secondary | ICD-10-CM

## 2019-10-20 DIAGNOSIS — B373 Candidiasis of vulva and vagina: Secondary | ICD-10-CM | POA: Insufficient documentation

## 2019-10-20 MED ORDER — FLUCONAZOLE 150 MG PO TABS: 150.0000 mg | ORAL_TABLET | Freq: Once | ORAL | 0 refills | Status: AC

## 2019-10-20 NOTE — Progress Notes (Signed)
Acute Office Visit  Subjective:    Patient ID: [name] [place], female    DOB: 03/10/1976, 44 y.o.   MRN: [id number]  Chief Complaint  Patient presents with  . Vaginitis    2 weeks. bough OTC cream-not helping    HPI Patient is in today for acute visit she is experiencing itching in her vagina no discharge purchase vagasil used for 2 weeks no relieve    Past Medical History:  Diagnosis Date  . Anxiety   . Ectopic pregnancy march  . Gestational diabetes    glyburide  . Heart palpitations    anxiety related  . Hypertension 2010    Past Surgical History:  Procedure Laterality Date  . HERNIA REPAIR    . INSERTION OF MESH N/A 01/11/2014   Procedure: INSERTION OF MESH;  Surgeon: Ralene Ok, MD;  Location: [facility];  Service: General;  Laterality: N/A;  . UMBILICAL HERNIA REPAIR N/A 01/11/2014   Procedure: LAPAROSCOPIC UMBILICAL HERNIA;  Surgeon: Ralene Ok, MD;  Location: [facility];  Service: General;  Laterality: N/A;    Family History  Problem Relation Age of Onset  . Cirrhosis Mother        Not alcoholic--not clear of etiology  . Ulcers Mother   . Alcohol abuse Father     Social History   Socioeconomic History  . Marital status: Married    Spouse name: Not on file  . Number of children: 3  . Years of education: Not on file  . Highest education level: Not on file  Occupational History  . 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 Smoking status: Never Smoker  . Smokeless tobacco: Never Used  Substance and Sexual Activity  . Alcohol use: No  . Drug use: No  . Sexual activity: Yes  Other Topics Concern  . Not on file  Social History Narrative   Originally from [place], [place]   In [place]. Since 2005   Lives with husband and 3 children   Children:  Ages 7, 77, 5 months.   Social Determinants of Health   Financial Resource Strain:   . Difficulty of Paying Living Expenses: Not on file  Food Insecurity:   . Worried About Charity fundraiser  in the Last Year: Not on file  . Ran Out of Food in the Last Year: Not on file  Transportation Needs:   . Lack of Transportation (Medical): Not on file  . Lack of Transportation (Non-Medical): Not on file  Physical Activity:   . Days of Exercise per Week: Not on file  . Minutes of Exercise per Session: Not on file  Stress:   . Feeling of Stress : Not on file  Social Connections:   . Frequency of Communication with Friends and Family: Not on file  . Frequency of Social Gatherings with Friends and Family: Not on file  . Attends Religious Services: Not on file  . Active Member of Clubs or Organizations: Not on file  . Attends Archivist Meetings: Not on file  . Marital Status: Not on file  Intimate Partner Violence:   . Fear of Current or Ex-Partner: Not on file  . Emotionally Abused: Not on file  . Physically Abused: Not on file  . Sexually Abused: Not on file    Outpatient Medications Prior to Visit  Medication Sig Dispense Refill  . atorvastatin (LIPITOR) 40 MG tablet Take 1 tablet (40 mg total) by mouth daily. (Patient not taking: Reported on  10/20/2019) 90 tablet 1  . blood glucose meter kit and supplies KIT Dispense based on patient and insurance preference. Use up to four times daily as directed. (FOR ICD-9 250.00, 250.01). (Patient not taking: Reported on 04/29/2019) 1 each 0  . glimepiride (AMARYL) 2 MG tablet Take 1 tablet (2 mg total) by mouth daily before breakfast. (Patient not taking: Reported on 04/09/2019) 30 tablet 11  . glucose blood (TRUE METRIX BLOOD GLUCOSE TEST) test strip Use three times a day. (Patient not taking: Reported on 04/29/2019) 100 each 11  . omeprazole (PRILOSEC) 20 MG capsule Take 1 capsule (20 mg total) by mouth daily. (Patient not taking: Reported on 07/10/2019) 30 capsule 3  . metroNIDAZOLE (FLAGYL) 500 MG tablet Take 1 tablet (500 mg total) by mouth 2 (two) times daily. 14 tablet 0   No facility-administered medications prior to visit.     No Known Allergies  Review of Systems  Genitourinary: Positive for frequency and vaginal discharge.  All other systems reviewed and are negative.      Objective:    Physical Exam Vitals reviewed.  Constitutional:      Appearance: Normal appearance.  Cardiovascular:     Rate and Rhythm: Normal rate and regular rhythm.  Pulmonary:     Effort: Pulmonary effort is normal.     Breath sounds: Normal breath sounds.  Abdominal:     General: Bowel sounds are normal.  Musculoskeletal:        General: Normal range of motion.     Cervical back: Normal range of motion.  Neurological:     Mental Status: She is alert.  Psychiatric:        Mood and Affect: Mood normal.     BP 134/90 (BP Location: Right Arm, Patient Position: Sitting, Cuff Size: Normal)   Pulse 74   Temp (!) 97.3 F (36.3 C) (Temporal)   Ht 5' 5" (1.651 m)   Wt 155 lb 6.4 oz (70.5 kg)   SpO2 94%   BMI 25.86 kg/m  Wt Readings from Last 3 Encounters:  10/20/19 155 lb 6.4 oz (70.5 kg)  07/31/19 158 lb 6.4 oz (71.8 kg)  07/10/19 155 lb 3.2 oz (70.4 kg)    Health Maintenance Due  Topic Date Due  . OPHTHALMOLOGY EXAM  03/12/1987    There are no preventive care reminders to display for this patient.   Lab Results  Component Value Date   TSH 1.880 04/17/2018   Lab Results  Component Value Date   WBC 7.9 07/10/2019   HGB 13.8 07/10/2019   HCT 41.2 07/10/2019   MCV 87 07/10/2019   PLT 294 07/10/2019   Lab Results  Component Value Date   NA 139 07/10/2019   K 4.2 07/10/2019   CO2 23 07/10/2019   GLUCOSE 113 (H) 07/10/2019   BUN 12 07/10/2019   CREATININE 0.75 07/10/2019   BILITOT 0.6 07/10/2019   ALKPHOS 80 07/10/2019   AST 21 07/10/2019   ALT 26 07/10/2019   PROT 7.6 07/10/2019   ALBUMIN 4.4 07/10/2019   CALCIUM 9.4 07/10/2019   ANIONGAP 7 05/06/2017   Lab Results  Component Value Date   CHOL 174 07/10/2019   Lab Results  Component Value Date   HDL 36 (L) 07/10/2019   Lab Results   Component Value Date   LDLCALC 74 07/10/2019   Lab Results  Component Value Date   TRIG 407 (H) 07/10/2019   Lab Results  Component Value Date   CHOLHDL 4.8 (H)  07/10/2019   Lab Results  Component Value Date   HGBA1C 6.1 (A) 07/10/2019       Assessment & Plan:  Chaselynn was seen today for vaginitis.  Diagnoses and all orders for this visit:  Vaginal yeast infection   Thrives in a moist dark areas increase with immune compromise, obese, and diabetic patients.  Macropopular or nodule skin rash. -     fluconazole (DIFLUCAN) 150 MG tablet; Take 1 tablet (150 mg total) by mouth once for 1 dose. May repeat dose if symptoms persist -     Cervicovaginal ancillary only      No orders of the defined types were placed in this encounter.    Kerin Perna, NP

## 2019-10-21 MED FILL — FLUCONAZOLE 150 MG TABS: 150 | 2 days supply | Qty: 2 | Fill #0

## 2019-10-22 ENCOUNTER — Ambulatory Visit
Admission: RE | Admit: 2019-10-22 | Discharge: 2019-10-22 | Disposition: A | Discharge status: Home / Self Care | Payer: Self-pay | Source: Ambulatory Visit | Attending: Primary Care | Admitting: Primary Care

## 2019-10-22 ENCOUNTER — Other Ambulatory Visit: Payer: Self-pay

## 2019-10-22 DIAGNOSIS — Z1231 Encounter for screening mammogram for malignant neoplasm of breast: Secondary | ICD-10-CM

## 2019-10-22 LAB — CERVICOVAGINAL ANCILLARY ONLY
Bacterial Vaginitis (gardnerella): NEGATIVE
Candida Glabrata: NEGATIVE
Candida Vaginitis: POSITIVE — AB
Chlamydia: NEGATIVE
Comment: NEGATIVE
Comment: NEGATIVE
Comment: NEGATIVE
Comment: NEGATIVE
Comment: NEGATIVE
Comment: NORMAL
Neisseria Gonorrhea: NEGATIVE
Trichomonas: NEGATIVE

## 2019-10-23 ENCOUNTER — Other Ambulatory Visit (INDEPENDENT_AMBULATORY_CARE_PROVIDER_SITE_OTHER): Payer: Self-pay | Admitting: Primary Care

## 2019-10-23 ENCOUNTER — Telehealth (INDEPENDENT_AMBULATORY_CARE_PROVIDER_SITE_OTHER): Payer: Self-pay

## 2019-10-23 MED ORDER — FLUCONAZOLE 150 MG PO TABS: 150.0000 mg | ORAL_TABLET | Freq: Once | ORAL | 0 refills | Status: AC

## 2019-10-23 MED FILL — FLUCONAZOLE 150 MG TABS: 150 | 1 days supply | Qty: 1 | Fill #0

## 2019-10-23 NOTE — Telephone Encounter (Signed)
Call placed to patient using pacific interpreter [phone]) patient is aware that pap is normal. She already picked up medication and took first dose. Maryjean Morn, CMA

## 2019-10-23 NOTE — Telephone Encounter (Signed)
-----   Message from Grayce Sessions, NP sent at 10/23/2019 10:06 AM EST ----- Pap is negative for cervical cancer but results are positive for yeast sent in diflucan

## 2019-11-25 ENCOUNTER — Ambulatory Visit (INDEPENDENT_AMBULATORY_CARE_PROVIDER_SITE_OTHER): Payer: Self-pay | Admitting: Primary Care

## 2019-11-25 ENCOUNTER — Encounter (INDEPENDENT_AMBULATORY_CARE_PROVIDER_SITE_OTHER): Payer: Self-pay | Admitting: Primary Care

## 2019-11-25 ENCOUNTER — Other Ambulatory Visit: Payer: Self-pay

## 2019-11-25 VITALS — BP 130/89 | HR 85 | Temp 97.2°F | Ht 65.0 in | Wt 148.6 lb

## 2019-11-25 DIAGNOSIS — N898 Other specified noninflammatory disorders of vagina: Secondary | ICD-10-CM

## 2019-11-25 MED ORDER — FLUCONAZOLE 150 MG PO TABS: 150.0000 mg | ORAL_TABLET | Freq: Once | ORAL | 0 refills | Status: AC

## 2019-11-25 MED FILL — FLUCONAZOLE 150 MG TABLET: 150 | 3 days supply | Qty: 2 | Fill #0

## 2019-11-25 NOTE — Patient Instructions (Signed)
Vaginitis Vaginitis  La vaginitis es la irritacin e hinchazn (inflamacin) de la vagina. Ocurre cuando las bacterias y levaduras que se encuentran normalmente en la vagina crecen demasiado. Hay muchos tipos de esta afeccin. El tratamiento depende del tipo que usted tenga. Siga estas indicaciones en su casa: Estilo de vida  Mantenga el rea vaginal limpia y seca. ? Evite usar jabn. ? Enjuague la zona con agua.  No haga las siguientes cosas hasta que el mdico lo autorice: ? Lavar y limpiar dentro de la vagina (ducha vaginal). ? Usar tampones. ? Tener relaciones sexuales.  Cuando vaya al bao, lmpiese de adelante hacia atrs.  Deje que la vagina respire. ? Use ropa interior de algodn. ? No use:  Ropa interior mientras duerme.  Pantalones ajustados.  Ropa interior tipo tanga.  Ropa interior o medias de nailon sin proteccin de algodn. ? Qutese la ropa hmeda, como los trajes de bao, lo antes posible.  Use productos suaves y sin perfume. No use cosas que puedan irritar la vagina, como los suavizantes para telas. Evite los siguientes productos si son perfumados: ? Aerosoles ntimos femeninos. ? Detergentes. ? Tampones. ? Productos de higiene femenina. ? Jabones o baos de espuma.  Practique sexo seguro y use preservativos. Instrucciones generales  Tome los medicamentos de venta libre y los recetados solamente como se lo haya indicado el mdico.  Si le recetaron un antibitico, tmelo o selo como se lo haya indicado el mdico. No deje de tomar ni de usar los antibiticos aunque comience a sentirse mejor.  Concurra a todas las visitas de control como se lo haya indicado el mdico. Esto es importante. Comunquese con un mdico si:  Tiene dolor de vientre.  Tiene fiebre.  Sus sntomas duran ms de 2 o 3das. Solicite ayuda de inmediato si:  Tiene fiebre y los sntomas empeoran de manera sbita. Resumen  La vaginitis es la irritacin e hinchazn de la  vagina. Puede ocurrir cuando las bacterias y levaduras que se encuentran normalmente en la vagina crecen demasiado. Hay varios tipos.  El tratamiento depende del tipo que usted tenga.  No se haga duchas vaginales, no use tampones ni tenga relaciones sexuales hasta que el mdico la autorice. Cuando pueda volver a tener relaciones sexuales, practique sexo seguro y use condones. Esta informacin no tiene como fin reemplazar el consejo del mdico. Asegrese de hacerle al mdico cualquier pregunta que tenga. Document Revised: 06/06/2017 Document Reviewed: 02/21/2012 Elsevier Patient Education  2020 Elsevier Inc.  

## 2019-11-25 NOTE — Progress Notes (Signed)
Established Patient Office Visit  Subjective:  Patient ID: [name] Perry, female    DOB: [birth year]  Age: 44 y.o. MRN: [id number]  CC:  Chief Complaint  Patient presents with  . Vaginal Itching    HPI Jasmain Ahlberg presents for vaginal itching.   Past Medical History:  Diagnosis Date  . Anxiety   . Ectopic pregnancy march  . Gestational diabetes    glyburide  . Heart palpitations    anxiety related  . Hypertension 2010    Past Surgical History:  Procedure Laterality Date  . HERNIA REPAIR    . INSERTION OF MESH N/A 01/11/2014   Procedure: INSERTION OF MESH;  Surgeon: Ralene Ok, MD;  Location: [facility];  Service: General;  Laterality: N/A;  . UMBILICAL HERNIA REPAIR N/A 01/11/2014   Procedure: LAPAROSCOPIC UMBILICAL HERNIA;  Surgeon: Ralene Ok, MD;  Location: [facility];  Service: General;  Laterality: N/A;    Family History  Problem Relation Age of Onset  . Cirrhosis Mother        Not alcoholic--not clear of etiology  . Ulcers Mother   . Alcohol abuse Father     Social History   Socioeconomic History  . Marital status: Married    Spouse name: Not on file  . Number of children: 3  . Years of education: Not on file  . Highest education level: Not on file  Occupational History  . 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 Smoking status: Never Smoker  . Smokeless tobacco: Never Used  Substance and Sexual Activity  . Alcohol use: No  . Drug use: No  . Sexual activity: Yes  Other Topics Concern  . Not on file  Social History Narrative   Originally from [place], [place]   In [place]. Since 2005   Lives with husband and 3 children   Children:  Ages 2, 68, 5 months.   Social Determinants of Health   Financial Resource Strain:   . Difficulty of Paying Living Expenses: Not on file  Food Insecurity:   . Worried About Charity fundraiser in the Last Year: Not on file  . Ran Out of Food in the Last Year: Not on file  Transportation  Needs:   . Lack of Transportation (Medical): Not on file  . Lack of Transportation (Non-Medical): Not on file  Physical Activity:   . Days of Exercise per Week: Not on file  . Minutes of Exercise per Session: Not on file  Stress:   . Feeling of Stress : Not on file  Social Connections:   . Frequency of Communication with Friends and Family: Not on file  . Frequency of Social Gatherings with Friends and Family: Not on file  . Attends Religious Services: Not on file  . Active Member of Clubs or Organizations: Not on file  . Attends Archivist Meetings: Not on file  . Marital Status: Not on file  Intimate Partner Violence:   . Fear of Current or Ex-Partner: Not on file  . Emotionally Abused: Not on file  . Physically Abused: Not on file  . Sexually Abused: Not on file    Outpatient Medications Prior to Visit  Medication Sig Dispense Refill  . atorvastatin (LIPITOR) 40 MG tablet Take 1 tablet (40 mg total) by mouth daily. (Patient not taking: Reported on 10/20/2019) 90 tablet 1  . blood glucose meter kit and supplies KIT Dispense based on patient and insurance preference. Use up to four times daily  as directed. (FOR ICD-9 250.00, 250.01). (Patient not taking: Reported on 04/29/2019) 1 each 0  . glimepiride (AMARYL) 2 MG tablet Take 1 tablet (2 mg total) by mouth daily before breakfast. (Patient not taking: Reported on 04/09/2019) 30 tablet 11  . glucose blood (TRUE METRIX BLOOD GLUCOSE TEST) test strip Use three times a day. (Patient not taking: Reported on 04/29/2019) 100 each 11  . omeprazole (PRILOSEC) 20 MG capsule Take 1 capsule (20 mg total) by mouth daily. (Patient not taking: Reported on 07/10/2019) 30 capsule 3   No facility-administered medications prior to visit.    No Known Allergies  ROS Review of Systems  Genitourinary: Positive for vaginal discharge.       Itching       Objective:    Physical Exam  Constitutional: She is oriented to person, place, and time.  She appears well-developed and well-nourished.  HENT:  Head: Normocephalic.  Cardiovascular: Normal rate and regular rhythm.  Pulmonary/Chest: Effort normal and breath sounds normal.  Abdominal: Soft. Bowel sounds are normal.  Musculoskeletal:        General: Normal range of motion.     Cervical back: Normal range of motion.  Neurological: She is oriented to person, place, and time.  Skin: Skin is warm and dry.  Psychiatric: She has a normal mood and affect. Her behavior is normal. Judgment and thought content normal.    BP 130/89 (BP Location: Right Arm, Patient Position: Sitting, Cuff Size: Normal)   Pulse 85   Temp (!) 97.2 F (36.2 C) (Temporal)   Ht '5\' 5"'$  (1.651 m)   Wt 148 lb 9.6 oz (67.4 kg)   SpO2 97%   BMI 24.73 kg/m  Wt Readings from Last 3 Encounters:  11/25/19 148 lb 9.6 oz (67.4 kg)  10/20/19 155 lb 6.4 oz (70.5 kg)  07/31/19 158 lb 6.4 oz (71.8 kg)     Health Maintenance Due  Topic Date Due  . OPHTHALMOLOGY EXAM  03/12/1987    There are no preventive care reminders to display for this patient.  Lab Results  Component Value Date   TSH 1.880 04/17/2018   Lab Results  Component Value Date   WBC 7.9 07/10/2019   HGB 13.8 07/10/2019   HCT 41.2 07/10/2019   MCV 87 07/10/2019   PLT 294 07/10/2019   Lab Results  Component Value Date   NA 139 07/10/2019   K 4.2 07/10/2019   CO2 23 07/10/2019   GLUCOSE 113 (H) 07/10/2019   BUN 12 07/10/2019   CREATININE 0.75 07/10/2019   BILITOT 0.6 07/10/2019   ALKPHOS 80 07/10/2019   AST 21 07/10/2019   ALT 26 07/10/2019   PROT 7.6 07/10/2019   ALBUMIN 4.4 07/10/2019   CALCIUM 9.4 07/10/2019   ANIONGAP 7 05/06/2017   Lab Results  Component Value Date   CHOL 174 07/10/2019   Lab Results  Component Value Date   HDL 36 (L) 07/10/2019   Lab Results  Component Value Date   LDLCALC 74 07/10/2019   Lab Results  Component Value Date   TRIG 407 (H) 07/10/2019   Lab Results  Component Value Date    CHOLHDL 4.8 (H) 07/10/2019   Lab Results  Component Value Date   HGBA1C 6.1 (A) 07/10/2019      Assessment & Plan:  Starlyn was seen today for vaginal itching.  Diagnoses and all orders for this visit:  Vaginal itching -     Cervicovaginal ancillary only  Other orders -  fluconazole (DIFLUCAN) 150 MG tablet; Take 1 tablet (150 mg total) by mouth once for 1 dose. May repeat dose in 2-3 days if symptoms have not resolved   Meds ordered this encounter  Medications  . fluconazole (DIFLUCAN) 150 MG tablet    Sig: Take 1 tablet (150 mg total) by mouth once for 1 dose. May repeat dose in 2-3 days if symptoms have not resolved    Dispense:  2 tablet    Refill:  0    Follow-up: Return in about 6 months (around 05/27/2020) for Check A1C in person.    Kerin Perna, NP

## 2019-11-26 ENCOUNTER — Other Ambulatory Visit (INDEPENDENT_AMBULATORY_CARE_PROVIDER_SITE_OTHER): Payer: Self-pay | Admitting: Primary Care

## 2019-11-26 ENCOUNTER — Other Ambulatory Visit (HOSPITAL_COMMUNITY)
Admission: RE | Admit: 2019-11-26 | Discharge: 2019-11-26 | Disposition: A | Discharge status: Home / Self Care | Payer: No Typology Code available for payment source | Source: Ambulatory Visit | Attending: Primary Care | Admitting: Primary Care

## 2019-11-26 DIAGNOSIS — N898 Other specified noninflammatory disorders of vagina: Secondary | ICD-10-CM | POA: Insufficient documentation

## 2019-11-27 LAB — CERVICOVAGINAL ANCILLARY ONLY
Bacterial Vaginitis (gardnerella): POSITIVE — AB
Candida Glabrata: NEGATIVE
Candida Vaginitis: POSITIVE — AB
Chlamydia: NEGATIVE
Comment: NEGATIVE
Comment: NEGATIVE
Comment: NEGATIVE
Comment: NEGATIVE
Comment: NEGATIVE
Comment: NORMAL
Neisseria Gonorrhea: NEGATIVE
Trichomonas: NEGATIVE

## 2019-12-02 ENCOUNTER — Other Ambulatory Visit (INDEPENDENT_AMBULATORY_CARE_PROVIDER_SITE_OTHER): Payer: Self-pay | Admitting: Primary Care

## 2019-12-02 MED ORDER — FLUCONAZOLE 150 MG PO TABS: 150.0000 mg | ORAL_TABLET | Freq: Once | ORAL | 0 refills | Status: AC

## 2019-12-02 MED ORDER — METRONIDAZOLE 500 MG PO TABS: 500.0000 mg | ORAL_TABLET | Freq: Two times a day (BID) | ORAL | 0 refills | Status: DC

## 2019-12-02 MED FILL — metroNIDAZOLE 500 MG TABS: 500 | 7 days supply | Qty: 14 | Fill #0

## 2019-12-04 MED FILL — FLUCONAZOLE 150 MG TABLET: 150 | 1 days supply | Qty: 1 | Fill #0

## 2020-01-01 ENCOUNTER — Ambulatory Visit: Payer: Self-pay | Attending: Primary Care

## 2020-01-01 ENCOUNTER — Other Ambulatory Visit: Payer: Self-pay

## 2020-01-08 ENCOUNTER — Ambulatory Visit (INDEPENDENT_AMBULATORY_CARE_PROVIDER_SITE_OTHER): Payer: Self-pay | Admitting: Primary Care

## 2020-02-24 ENCOUNTER — Encounter (INDEPENDENT_AMBULATORY_CARE_PROVIDER_SITE_OTHER): Payer: Self-pay | Admitting: Primary Care

## 2020-02-24 ENCOUNTER — Telehealth (INDEPENDENT_AMBULATORY_CARE_PROVIDER_SITE_OTHER): Payer: Self-pay | Admitting: Primary Care

## 2020-02-24 ENCOUNTER — Other Ambulatory Visit: Payer: Self-pay

## 2020-02-24 DIAGNOSIS — N898 Other specified noninflammatory disorders of vagina: Secondary | ICD-10-CM

## 2020-02-24 NOTE — Progress Notes (Addendum)
Virtual Visit via Telephone Note  I connected with [name] Perry on 02/24/20 at  2:50 PM EDT by telephone and verified that I am speaking with the correct person using two identifiers.   I discussed the limitations, risks, security and privacy concerns of performing an evaluation and management service by telephone and the availability of in person appointments. I also discussed with the patient that there may be a patient responsible charge related to this service. The patient expressed understanding and agreed to proceed. Patient home [name] Perry in the office at [facility]    History of Present Illness: Ms. [name] Perry is a 44 year old Hispanic female speaks Spanish only interpretor used [place] [phone]. She I  s having a tele visit for vaginal itching and discharge. Asked availibity    Observations/Objective: Review of Systems  Genitourinary:       Vaginal discharge and itching .  All other systems reviewed and are negative.   Assessment and Plan: [name] Perry was seen today for vaginal itching.  Diagnoses and all orders for this visit:  Vaginal itching Discharge, itching will be in on Friday for cervical ancillary    Follow Up Instructions:    I discussed the assessment and treatment plan with the patient. The patient was provided an opportunity to ask questions and all were answered. The patient agreed with the plan and demonstrated an understanding of the instructions.   The patient was advised to call back or seek an in-person evaluation if the symptoms worsen or if the condition fails to improve as anticipated.  I provided 8 minutes of non-face-to-face time during this encounter.   Grayce Sessions, NP

## 2020-02-26 ENCOUNTER — Other Ambulatory Visit (HOSPITAL_COMMUNITY)
Admission: RE | Admit: 2020-02-26 | Discharge: 2020-02-26 | Disposition: A | Discharge status: Home / Self Care | Payer: No Typology Code available for payment source | Source: Ambulatory Visit | Attending: Primary Care | Admitting: Primary Care

## 2020-02-26 DIAGNOSIS — N898 Other specified noninflammatory disorders of vagina: Secondary | ICD-10-CM | POA: Insufficient documentation

## 2020-02-26 NOTE — Addendum Note (Signed)
Addended by: Maryjean Morn on: 02/26/2020 08:59 AM   Modules accepted: Orders

## 2020-02-29 LAB — CERVICOVAGINAL ANCILLARY ONLY
Bacterial Vaginitis (gardnerella): NEGATIVE
Candida Glabrata: POSITIVE — AB
Candida Vaginitis: POSITIVE — AB
Chlamydia: NEGATIVE
Comment: NEGATIVE
Comment: NEGATIVE
Comment: NEGATIVE
Comment: NEGATIVE
Comment: NEGATIVE
Comment: NORMAL
Neisseria Gonorrhea: NEGATIVE
Trichomonas: NEGATIVE

## 2020-03-01 ENCOUNTER — Other Ambulatory Visit (INDEPENDENT_AMBULATORY_CARE_PROVIDER_SITE_OTHER): Payer: Self-pay | Admitting: Primary Care

## 2020-03-01 DIAGNOSIS — B379 Candidiasis, unspecified: Secondary | ICD-10-CM

## 2020-03-01 MED ORDER — FLUCONAZOLE 150 MG PO TABS: 150.0000 mg | ORAL_TABLET | Freq: Once | ORAL | 0 refills | Status: AC

## 2020-03-03 ENCOUNTER — Other Ambulatory Visit (INDEPENDENT_AMBULATORY_CARE_PROVIDER_SITE_OTHER): Payer: Self-pay | Admitting: Primary Care

## 2020-03-03 MED ORDER — FLUCONAZOLE 150 MG PO TABS
150.0000 mg | ORAL_TABLET | Freq: Once | ORAL | 0 refills | Status: AC
Start: 2020-03-03 — End: 2020-03-03

## 2020-05-27 ENCOUNTER — Ambulatory Visit (INDEPENDENT_AMBULATORY_CARE_PROVIDER_SITE_OTHER): Payer: No Typology Code available for payment source | Admitting: Primary Care

## 2020-07-14 ENCOUNTER — Ambulatory Visit: Payer: Self-pay | Attending: Internal Medicine | Admitting: Family

## 2020-07-14 ENCOUNTER — Other Ambulatory Visit: Payer: Self-pay

## 2020-07-14 DIAGNOSIS — Z789 Other specified health status: Secondary | ICD-10-CM

## 2020-07-14 DIAGNOSIS — Z7689 Persons encountering health services in other specified circumstances: Secondary | ICD-10-CM

## 2020-07-14 DIAGNOSIS — B379 Candidiasis, unspecified: Secondary | ICD-10-CM

## 2020-07-14 DIAGNOSIS — B3731 Acute candidiasis of vulva and vagina: Secondary | ICD-10-CM

## 2020-07-14 DIAGNOSIS — N898 Other specified noninflammatory disorders of vagina: Secondary | ICD-10-CM

## 2020-07-14 DIAGNOSIS — B373 Candidiasis of vulva and vagina: Secondary | ICD-10-CM

## 2020-07-14 NOTE — Progress Notes (Signed)
Virtual Visit via Telephone Note  I connected with [name] Perry, on 07/14/2020 at 1:49 PM by telephone due to the COVID-19 pandemic and verified that I am speaking with the correct person using two identifiers.  Due to current restrictions/limitations of in-office visits due to the COVID-19 pandemic, this scheduled clinical appointment was converted to a telehealth visit.   Consent: I discussed the limitations, risks, security and privacy concerns of performing an evaluation and management service by telephone and the availability of in person appointments. I also discussed with the patient that there may be a patient responsible charge related to this service. The patient expressed understanding and agreed to proceed.  Location of Patient: Home  Location of Provider: [place]  Persons participating in Telemedicine visit: [name] Perry [name] Fruits, NP [name] Perry, [employer], Interpreter Name: [name] Perry, NW#:[id number]  History of Present Illness: [name] Perry is a 44 year-old female with history of CAP, gestational diabetes mellitus affecting pregnancy, and hypertension in pregnancy who presents today to establish care.   PRESENT ILLNESS:  Has diabetes and previously taking medications which were discontinued because disease was managed well. Complains of vaginal itching for 3 months. Taking folic acid and calcium pills. Does not smoke or drink alcohol. Denies allergies.  PAST HISTORY:   Childhood illnesses: denies  Medical history: ectopic pregnancy, hypertension, anxiety, gestational diabetes, heart palpitations  Surgical history: umbilical hernia repair 11/29/6576; insertion of mesh 01/11/2014; gallbladder surgery 02/26/2019 and no follow-up    Obstetric/gynecology: pregnacies 6; 4 births; miscarriages 2; abortion 0; miscarriages 2; LMP: 06/22/2020 medium flow, 3 days; birth control Mirena  Psychiatric illness:  denies  Health maintenance: has not had flu vaccine or Covid vaccines yet  FAMILY HISTORY:   Mother, deceased, cirrhosis (not alcoholic, not clear etiology) and ulcers   Father, alcohol abuse   PERSONAL AND SOCIAL HISTORY:   Occupation: unemployed  Last year of schooling:  middle school 8th/9th   Home situation: husband and 4 children  Denies stress  Cooking for leisure activity  Christian religious beliefs  Exercise by walking sometimes  Diet balanced    Past Medical History:  Diagnosis Date  . Anxiety   . Ectopic pregnancy march  . Gestational diabetes    glyburide  . Heart palpitations    anxiety related  . Hypertension 2010   No Known Allergies  Current Outpatient Medications on File Prior to Visit  Medication Sig Dispense Refill  . atorvastatin (LIPITOR) 40 MG tablet Take 1 tablet (40 mg total) by mouth daily. (Patient not taking: Reported on 10/20/2019) 90 tablet 1  . blood glucose meter kit and supplies KIT Dispense based on patient and insurance preference. Use up to four times daily as directed. (FOR ICD-9 250.00, 250.01). (Patient not taking: Reported on 04/29/2019) 1 each 0  . glimepiride (AMARYL) 2 MG tablet Take 1 tablet (2 mg total) by mouth daily before breakfast. (Patient not taking: Reported on 04/09/2019) 30 tablet 11  . glucose blood (TRUE METRIX BLOOD GLUCOSE TEST) test strip Use three times a day. (Patient not taking: Reported on 04/29/2019) 100 each 11  . omeprazole (PRILOSEC) 20 MG capsule Take 1 capsule (20 mg total) by mouth daily. (Patient not taking: Reported on 07/10/2019) 30 capsule 3   No current facility-administered medications on file prior to visit.    Observations/Objective: Alert and oriented x 3. Not in acute distress. Physical examination not completed as this is a telemedicine visit.  Assessment and Plan: 1. Encounter to establish  care: - Presents today to establish care. - Follow-up in 1 month with primary physician for  management of chronic conditions.   2. Vaginal itching: - Cervicovaginal ancillary to screen for sexually transmitted infections.  - Cervicovaginal ancillary only  3. Language barrier: Psychologist, forensic participated during today's visit. Interpreter Name: [name] Perry CC#:[id number]  Follow Up Instructions: Follow-up with primary physician 1 month or sooner if needed.   Patient was given clear instructions to go to Emergency Department or return to medical center if symptoms don't improve, worsen, or new problems develop.The patient verbalized understanding.  I discussed the assessment and treatment plan with the patient. The patient was provided an opportunity to ask questions and all were answered. The patient agreed with the plan and demonstrated an understanding of the instructions.   The patient was advised to call back or seek an in-person evaluation if the symptoms worsen or if the condition fails to improve as anticipated.   I provided 27 minutes total of non-face-to-face time during this encounter including median intraservice time, reviewing previous notes, labs, imaging, medications, management and patient verbalized understanding.    Camillia Herter, NP  [facility] [place], [place]   07/14/2020, 8:04 AM

## 2020-07-14 NOTE — Patient Instructions (Addendum)
Mantenimiento de la salud en las mujeres Health Maintenance, Female Adoptar un estilo de vida saludable y recibir atencin preventiva son importantes para promover la salud y el bienestar. Consulte al mdico sobre:  El esquema adecuado para hacerse pruebas y exmenes peridicos.  Cosas que puede hacer por su cuenta para prevenir enfermedades y mantenerse sana. Qu debo saber sobre la dieta, el peso y el ejercicio? Consuma una dieta saludable   Consuma una dieta que incluya muchas verduras, frutas, productos lcteos con bajo contenido de grasa y protenas magras.  No consuma muchos alimentos ricos en grasas slidas, azcares agregados o sodio. Mantenga un peso saludable El ndice de masa muscular (IMC) se utiliza para identificar problemas de peso. Proporciona una estimacin de la grasa corporal basndose en el peso y la altura. Su mdico puede ayudarle a determinar su IMC y a lograr o mantener un peso saludable. Haga ejercicio con regularidad Haga ejercicio con regularidad. Esta es una de las prcticas ms importantes que puede hacer por su salud. La mayora de los adultos deben seguir estas pautas:  Realizar, al menos, 150minutos de actividad fsica por semana. El ejercicio debe aumentar la frecuencia cardaca y hacerlo transpirar (ejercicio de intensidad moderada).  Hacer ejercicios de fortalecimiento por lo menos dos veces por semana. Agregue esto a su plan de ejercicio de intensidad moderada.  Pasar menos tiempo sentados. Incluso la actividad fsica ligera puede ser beneficiosa. Controle sus niveles de colesterol y lpidos en la sangre Comience a realizarse anlisis de lpidos y colesterol en la sangre a los 20aos y luego reptalos cada 5aos. Hgase controlar los niveles de colesterol con mayor frecuencia si:  Sus niveles de lpidos y colesterol son altos.  Es mayor de 40aos.  Presenta un alto riesgo de padecer enfermedades cardacas. Qu debo saber sobre las pruebas de  deteccin del cncer? Segn su historia clnica y sus antecedentes familiares, es posible que deba realizarse pruebas de deteccin del cncer en diferentes edades. Esto puede incluir pruebas de deteccin de lo siguiente:  Cncer de mama.  Cncer de cuello uterino.  Cncer colorrectal.  Cncer de piel.  Cncer de pulmn. Qu debo saber sobre la enfermedad cardaca, la diabetes y la hipertensin arterial? Presin arterial y enfermedad cardaca  La hipertensin arterial causa enfermedades cardacas y aumenta el riesgo de accidente cerebrovascular. Es ms probable que esto se manifieste en las personas que tienen lecturas de presin arterial alta, tienen ascendencia africana o tienen sobrepeso.  Hgase controlar la presin arterial: ? Cada 3 a 5 aos si tiene entre 18 y 39 aos. ? Todos los aos si es mayor de 40aos. Diabetes Realcese exmenes de deteccin de la diabetes con regularidad. Este anlisis revisa el nivel de azcar en la sangre en ayunas. Hgase las pruebas de deteccin:  Cada tresaos despus de los 40aos de edad si tiene un peso normal y un bajo riesgo de padecer diabetes.  Con ms frecuencia y a partir de una edad inferior si tiene sobrepeso o un alto riesgo de padecer diabetes. Qu debo saber sobre la prevencin de infecciones? Hepatitis B Si tiene un riesgo ms alto de contraer hepatitis B, debe someterse a un examen de deteccin de este virus. Hable con el mdico para averiguar si tiene riesgo de contraer la infeccin por hepatitis B. Hepatitis C Se recomienda el anlisis a:  Todos los que nacieron entre 1945 y 1965.  Todas las personas que tengan un riesgo de haber contrado hepatitis C. Enfermedades de transmisin sexual (ETS)  Hgase las   pruebas de deteccin de ITS, incluidas la gonorrea y la clamidia, si: ? Es sexualmente activa y es menor de 24aos. ? Es mayor de 24aos, y el mdico le informa que corre riesgo de tener este tipo de  infecciones. ? La actividad sexual ha cambiado desde que le hicieron la ltima prueba de deteccin y tiene un riesgo mayor de tener clamidia o gonorrea. Pregntele al mdico si usted tiene riesgo.  Pregntele al mdico si usted tiene un alto riesgo de contraer VIH. El mdico tambin puede recomendarle un medicamento recetado para ayudar a evitar la infeccin por el VIH. Si elige tomar medicamentos para prevenir el VIH, primero debe hacerse los anlisis de deteccin del VIH. Luego debe hacerse anlisis cada 3meses mientras est tomando los medicamentos. Embarazo  Si est por dejar de menstruar (fase premenopusica) y usted puede quedar embarazada, busque asesoramiento antes de quedar embarazada.  Tome de 400 a 800microgramos (mcg) de cido flico todos los das si queda embarazada.  Pida mtodos de control de la natalidad (anticonceptivos) si desea evitar un embarazo no deseado. Osteoporosis y menopausia La osteoporosis es una enfermedad en la que los huesos pierden los minerales y la fuerza por el avance de la edad. El resultado pueden ser fracturas en los huesos. Si tiene 65aos o ms, o si est en riesgo de sufrir osteoporosis y fracturas, pregunte a su mdico si debe:  Hacerse pruebas de deteccin de prdida sea.  Tomar un suplemento de calcio o de vitamina D para reducir el riesgo de fracturas.  Recibir terapia de reemplazo hormonal (TRH) para tratar los sntomas de la menopausia. Siga estas instrucciones en su casa: Estilo de vida  No consuma ningn producto que contenga nicotina o tabaco, como cigarrillos, cigarrillos electrnicos y tabaco de mascar. Si necesita ayuda para dejar de fumar, consulte al mdico.  No consuma drogas.  No comparta agujas.  Solicite ayuda a su mdico si necesita apoyo o informacin para abandonar las drogas. Consumo de alcohol  No beba alcohol si: ? Su mdico le indica no hacerlo. ? Est embarazada, puede estar embarazada o est tratando de quedar  embarazada.  Si bebe alcohol: ? Limite la cantidad que consume de 0 a 1 medida por da. ? Limite la ingesta si est amamantando.  Est atento a la cantidad de alcohol que hay en las bebidas que toma. En [place], una medida equivale a una botella de cerveza de 12oz (355ml), un vaso de vino de 5oz (148ml) o un vaso de una bebida alcohlica de alta graduacin de 1oz (44ml). Instrucciones generales  Realcese los estudios de rutina de la salud, dentales y de la vista.  Mantngase al da con las vacunas.  Infrmele a su mdico si: ? Se siente deprimida con frecuencia. ? Alguna vez ha sido vctima de maltrato o no se siente segura en su casa. Resumen  Adoptar un estilo de vida saludable y recibir atencin preventiva son importantes para promover la salud y el bienestar.  Siga las instrucciones del mdico acerca de una dieta saludable, el ejercicio y la realizacin de pruebas o exmenes para detectar enfermedades.  Siga las instrucciones del mdico con respecto al control del colesterol y la presin arterial. Esta informacin no tiene como fin reemplazar el consejo del mdico. Asegrese de hacerle al mdico cualquier pregunta que tenga. Document Revised: 10/01/2018 Document Reviewed: 10/01/2018 Elsevier Patient Education  2020 Elsevier Inc.  

## 2020-07-15 ENCOUNTER — Other Ambulatory Visit: Payer: Self-pay | Admitting: Family

## 2020-07-15 LAB — CERVICOVAGINAL ANCILLARY ONLY
Bacterial Vaginitis (gardnerella): NEGATIVE
Candida Glabrata: POSITIVE — AB
Candida Vaginitis: POSITIVE — AB
Chlamydia: NEGATIVE
Comment: NEGATIVE
Comment: NEGATIVE
Comment: NEGATIVE
Comment: NEGATIVE
Comment: NEGATIVE
Comment: NORMAL
Neisseria Gonorrhea: NEGATIVE
Trichomonas: NEGATIVE

## 2020-07-15 MED ORDER — FLUCONAZOLE 150 MG PO TABS: 150.0000 mg | ORAL_TABLET | Freq: Once | ORAL | 0 refills | Status: DC

## 2020-07-15 NOTE — Addendum Note (Signed)
Addended by: Rema Fendt on: 07/15/2020 05:04 PM   Modules accepted: Orders

## 2020-07-15 NOTE — Progress Notes (Signed)
Please call patient with update.  Patient has yeast infection. Fluconazole prescribed and sent to pharmacy on file. Follow-up with primary physician as needed.  Gonorrhea, chlamydia, trichomonas, and bacterial vaginitis all negative.

## 2020-07-18 MED FILL — FLUCONAZOLE 150 MG TABLET: 150 | 1 days supply | Qty: 1 | Fill #0

## 2020-07-19 ENCOUNTER — Other Ambulatory Visit: Payer: Self-pay | Admitting: Family

## 2020-07-19 MED ORDER — FLUCONAZOLE 150 MG PO TABS: 150.0000 mg | ORAL_TABLET | Freq: Once | ORAL | 0 refills | Status: DC

## 2020-07-19 MED FILL — FLUCONAZOLE 150 MG TABLET: 150 | 1 days supply | Qty: 1 | Fill #0

## 2020-07-19 NOTE — Addendum Note (Signed)
Addended by: Rema Fendt on: 07/19/2020 01:42 PM   Modules accepted: Orders

## 2020-09-28 ENCOUNTER — Ambulatory Visit: Payer: No Typology Code available for payment source | Admitting: Family Medicine

## 2020-11-07 ENCOUNTER — Ambulatory Visit: Payer: Self-pay | Attending: Family Medicine | Admitting: Family Medicine

## 2020-11-07 ENCOUNTER — Encounter: Payer: Self-pay | Admitting: Family Medicine

## 2020-11-07 ENCOUNTER — Other Ambulatory Visit: Payer: Self-pay | Admitting: Family Medicine

## 2020-11-07 ENCOUNTER — Other Ambulatory Visit: Payer: Self-pay

## 2020-11-07 VITALS — BP 115/76 | HR 83 | Ht 65.0 in | Wt 113.0 lb

## 2020-11-07 DIAGNOSIS — N898 Other specified noninflammatory disorders of vagina: Secondary | ICD-10-CM

## 2020-11-07 DIAGNOSIS — R634 Abnormal weight loss: Secondary | ICD-10-CM

## 2020-11-07 DIAGNOSIS — E1165 Type 2 diabetes mellitus with hyperglycemia: Secondary | ICD-10-CM

## 2020-11-07 DIAGNOSIS — L0292 Furuncle, unspecified: Secondary | ICD-10-CM

## 2020-11-07 LAB — GLUCOSE, POCT (MANUAL RESULT ENTRY): POC Glucose: 283 mg/dl — AB (ref 70–99)

## 2020-11-07 LAB — POCT GLYCOSYLATED HEMOGLOBIN (HGB A1C): HbA1c POC (<> result, manual entry): >15 % (ref 4.0–5.6)

## 2020-11-07 MED ORDER — FLUCONAZOLE 150 MG PO TABS: 150.0000 mg | ORAL_TABLET | Freq: Once | ORAL | 3 refills | Status: DC

## 2020-11-07 MED ORDER — METFORMIN HCL 500 MG PO TABS: 500.0000 mg | ORAL_TABLET | Freq: Two times a day (BID) | ORAL | 3 refills | Status: DC

## 2020-11-07 MED ORDER — TRUEPLUS LANCETS 28G MISC: 1.0000 | Freq: Three times a day (TID) | 12 refills | Status: DC

## 2020-11-07 MED ORDER — ATORVASTATIN CALCIUM 40 MG PO TABS: 40.0000 mg | ORAL_TABLET | Freq: Every day | ORAL | 3 refills | Status: DC

## 2020-11-07 MED ORDER — CEPHALEXIN 500 MG PO CAPS: 500.0000 mg | ORAL_CAPSULE | Freq: Two times a day (BID) | ORAL | 0 refills | Status: DC

## 2020-11-07 MED ORDER — TRUE METRIX BLOOD GLUCOSE TEST VI STRP
ORAL_STRIP | 11 refills | Status: DC
Start: 2020-11-07 — End: 2020-11-07

## 2020-11-07 MED ORDER — GLIPIZIDE 5 MG PO TABS: 5.0000 mg | ORAL_TABLET | Freq: Two times a day (BID) | ORAL | 3 refills | Status: DC

## 2020-11-07 MED FILL — METFORMIN HCL 500 MG TABS: 500 | 30 days supply | Qty: 60 | Fill #0

## 2020-11-07 MED FILL — TRUE METRIX GLUCOSE TEST ST: 33 days supply | Qty: 100 | Fill #0

## 2020-11-07 MED FILL — TRUEplus LANCETS 28G MISC: 33 days supply | Qty: 100 | Fill #0

## 2020-11-07 MED FILL — ?GLIPIZIDE 5MG TABLET: 5 | 30 days supply | Qty: 60 | Fill #0

## 2020-11-07 MED FILL — ?CEPHALEXIN 500 MG CAPSULE: 500 | 7 days supply | Qty: 14 | Fill #0

## 2020-11-07 MED FILL — FLUCONAZOLE 150 MG TABLET: 150 | 4 days supply | Qty: 2 | Fill #0

## 2020-11-07 MED FILL — ?ATORVASTATIN 40MG TABLET: 40 | 30 days supply | Qty: 30 | Fill #0

## 2020-11-07 NOTE — Patient Instructions (Signed)
Diabetes mellitus y nutricin, en adultos Diabetes Mellitus and Nutrition, Adult Si sufre de diabetes, o diabetes mellitus, es muy importante tener hbitos alimenticios saludables debido a que sus niveles de azcar en la sangre (glucosa) se ven afectados en gran medida por lo que come y bebe. Comer alimentos saludables en las cantidades correctas, aproximadamente a la misma hora todos los das, lo ayudar a: Controlar la glucemia. Disminuir el riesgo de sufrir una enfermedad cardaca. Mejorar la presin arterial. Alcanzar o mantener un peso saludable. Qu puede afectar mi plan de alimentacin? Todas las personas que sufren de diabetes son diferentes y cada una tiene necesidades diferentes en cuanto a un plan de alimentacin. El mdico puede recomendarle que trabaje con un nutricionista para elaborar el mejor plan para usted. Su plan de alimentacin puede variar segn factores como: Las caloras que necesita. Los medicamentos que toma. Su peso. Sus niveles de glucemia, presin arterial y colesterol. Su nivel de actividad. Otras afecciones que tenga, como enfermedades cardacas o renales. Cmo me afectan los carbohidratos? Los carbohidratos, o hidratos de carbono, afectan su nivel de glucemia ms que cualquier otro tipo de alimento. La ingesta de carbohidratos naturalmente aumenta la cantidad de glucosa en la sangre. El recuento de carbohidratos es un mtodo destinado a llevar un registro de la cantidad de carbohidratos que se consumen. El recuento de carbohidratos es importante para mantener la glucemia a un nivel saludable, especialmente si utiliza insulina o toma determinados medicamentos por va oral para la diabetes. Es importante conocer la cantidad de carbohidratos que se pueden ingerir en cada comida sin correr ningn riesgo. Esto es diferente en cada persona. Su nutricionista puede ayudarlo a calcular la cantidad de carbohidratos que debe ingerir en cada comida y en cada refrigerio. Cmo  me afecta el alcohol? El alcohol puede provocar disminuciones sbitas de la glucemia (hipoglucemia), especialmente si utiliza insulina o toma determinados medicamentos por va oral para la diabetes. La hipoglucemia es una afeccin potencialmente mortal. Los sntomas de la hipoglucemia, como somnolencia, mareos y confusin, son similares a los sntomas de haber consumido demasiado alcohol. No beba alcohol si: Su mdico le indica no hacerlo. Est embarazada, puede estar embarazada o est tratando de quedar embarazada. Si bebe alcohol: No beba con el estmago vaco. Limite la cantidad que bebe: De 0 a 1 medida por da para las mujeres. De 0 a 2 medidas por da para los hombres. Est atento a la cantidad de alcohol que hay en las bebidas que toma. En los [place], una medida equivale a una botella de cerveza de 12 oz (355 ml), un vaso de vino de 5 oz (148 ml) o un vaso de una bebida alcohlica de alta graduacin de 1 oz (44 ml). Mantngase hidratado bebiendo agua, refrescos dietticos o t helado sin azcar. Tenga en cuenta que los refrescos comunes, los jugos y otras bebida para mezclar pueden contener mucha azcar y se deben contar como carbohidratos. Consejos para seguir este plan Leer las etiquetas de los alimentos Comience por leer el tamao de la porcin en la "Informacin nutricional" en las etiquetas de los alimentos envasados y las bebidas. La cantidad de caloras, carbohidratos, grasas y otros nutrientes mencionados en la etiqueta se basan en una porcin del alimento. Muchos alimentos contienen ms de una porcin por envase. Verifique la cantidad total de gramos (g) de carbohidratos totales en una porcin. Puede calcular la cantidad de porciones de carbohidratos al dividir el total de carbohidratos por 15. Por ejemplo, si un alimento tiene un   total de 30 g de carbohidratos totales por porcin, equivale a 2 porciones de carbohidratos. Verifique la cantidad de gramos (g) de grasas  saturadas y grasas trans de una porcin. Escoja alimentos que no contengan estas grasas o que su contenido de estas sea bajo. Verifique la cantidad de miligramos (mg) de sal (sodio) en una porcin. La mayora de las personas deben limitar la ingesta de sodio total a menos de 2300 mg por da. Siempre consulte la informacin nutricional de los alimentos etiquetados como "con bajo contenido de grasa" o "sin grasa". Estos alimentos pueden tener un mayor contenido de azcar agregada o carbohidratos refinados, y deben evitarse. Hable con su nutricionista para identificar sus objetivos diarios en cuanto a los nutrientes mencionados en la etiqueta. Al ir de compras Evite comprar alimentos procesados, enlatados o precocidos. Estos alimentos tienden a tener una mayor cantidad de grasa, sodio y azcar agregada. Compre en la zona exterior de la tienda de comestibles. Esta es la zona donde se encuentran con mayor frecuencia las frutas y las verduras frescas, los cereales a granel, las carnes frescas y los productos lcteos frescos. Al cocinar Utilice mtodos de coccin a baja temperatura, como hornear, en lugar de mtodos de coccin a alta temperatura, como frer en abundante aceite. Cocine con aceites saludables, como el aceite de oliva, canola o girasol. Evite cocinar con manteca, crema o carnes con alto contenido de grasa. Planificacin de las comidas Coma las comidas y los refrigerios regularmente, preferentemente a la misma hora todos los das. Evite pasar largos perodos de tiempo sin comer. Consuma alimentos ricos en fibra, como frutas frescas, verduras, frijoles y cereales integrales. Consulte a su nutricionista sobre cuntas porciones de carbohidratos puede consumir en cada comida. Consuma entre 4 y 6 onzas (entre 112 y 168 g) de protenas magras por da, como carnes magras, pollo, pescado, huevos o tofu. Una onza (oz) de protena magra equivale a: 1 onza (28 g) de carne, pollo o pescado. 1 huevo.  de  taza (62 g) de tofu. Coma algunos alimentos por da que contengan grasas saludables, como aguacates, frutos secos, semillas y pescado. Qu alimentos debo comer? Frutas Bayas. Manzanas. Naranjas. Duraznos. Damascos. Ciruelas. Uvas. Mango. Papaya. Granada. Kiwi. Cerezas. Verduras Lechuga. Espinaca. Verduras de hoja verde, que incluyen col rizada, acelga, hojas de berza y de mostaza. Remolachas. Coliflor. Repollo. Brcoli. Zanahorias. Judas verdes. Tomates. Pimientos. Cebollas. Pepinos. Coles de Bruselas. Granos Granos integrales, como panes, galletas, tortillas, cereales y pastas de salvado o integrales. Avena sin azcar. Quinua. Arroz integral o salvaje. Carnes y otras protenas Mariscos. Carne de ave sin piel. Cortes magros de ave y carne de res. Tofu. Frutos secos. Semillas. Lcteos Productos lcteos sin grasa o con bajo contenido de grasa, como leche, yogur y queso. Es posible que los productos que se enumeran ms arriba no constituyan una lista completa de los alimentos y las bebidas que puede tomar. Consulte a un nutricionista para obtener ms informacin. Qu alimentos debo evitar? Frutas Frutas enlatadas al almbar. Verduras Verduras enlatadas. Verduras congeladas con mantequilla o salsa de crema. Granos Productos elaborados con harina y harina blanca refinada, como panes, pastas, bocadillos y cereales. Evite todos los alimentos procesados. Carnes y otras protenas Cortes de carne con alto contenido de grasa. Carne de ave con piel. Carnes empanizadas o fritas. Carne procesada. Evite las grasas saturadas. Lcteos Yogur, queso o leche enteros. Bebidas Bebidas azucaradas, como gaseosas o t helado. Es posible que los productos que se enumeran ms arriba no constituyan una lista completa de   los alimentos y las bebidas que debe evitar. Consulte a un nutricionista para obtener ms informacin. Preguntas para hacerle al mdico Es necesario que me rena con un instructor en el cuidado  de la diabetes? Es necesario que me rena con un nutricionista? A qu nmero puedo llamar si tengo preguntas? Cules son los mejores momentos para controlar la glucemia? Dnde encontrar ms informacin: Asociacin Estadounidense de la Diabetes (American Diabetes Association): diabetes.org Academy of Nutrition and Dietetics (Academia de Nutricin y Diettica): www.eatright.org National Institute of Diabetes and Digestive and Kidney Diseases (Instituto Nacional de la Diabetes y las Enfermedades Digestivas y Renales): www.niddk.nih.gov Association of Diabetes Care and Education Specialists (Asociacin de Especialistas en Atencin y Educacin sobre la Diabetes): www.diabeteseducator.org Resumen Es importante tener hbitos alimenticios saludables debido a que sus niveles de azcar en la sangre (glucosa) se ven afectados en gran medida por lo que come y bebe. Un plan de alimentacin saludable lo ayudar a controlar la glucemia y mantener un estilo de vida saludable. El mdico puede recomendarle que trabaje con un nutricionista para elaborar el mejor plan para usted. Tenga en cuenta que los carbohidratos (hidratos de carbono) y el alcohol tienen efectos inmediatos en sus niveles de glucemia. Es importante contar los carbohidratos que ingiere y consumir alcohol con prudencia. Esta informacin no tiene como fin reemplazar el consejo del mdico. Asegrese de hacerle al mdico cualquier pregunta que tenga. Document Revised: 10/15/2019 Document Reviewed: 10/15/2019 Elsevier Patient Education  2021 Elsevier Inc.  

## 2020-11-07 NOTE — Progress Notes (Signed)
Subjective:  Patient ID: [name] Perry, female    DOB: Oct 09, 1975  Age: 45 y.o. MRN: [id number]  CC: New Patient (Initial Visit)   HPI [name] Perry is a 45 year old female with a history of gestational diabetes mellitus who presents today with the following symptoms.  Complains of vaginal itching which is causing her a lot of stress and this has been recurrent with improvement whenever she receives treatment after which symptoms would return.  Denies presence of discharge.  She has no monthly cycles. She has a R ear swelling with a "pinching sensation/ pain at night" causing her distress. She has lost 42 lbs in the last year. Her appetite has increased, she denies presence of diarrhea or palpitations.  A1c performed in the clinic returned greater than 15 and blood sugar in the clinic is 283.  On further questioning she informed me she was previously told she was diabetic but then she was 'cured'.  Last A1c in 2020 was 6.1.  Past Medical History:  Diagnosis Date  . Anxiety   . Ectopic pregnancy march  . Gestational diabetes    glyburide  . Heart palpitations    anxiety related  . Hypertension 2010    Past Surgical History:  Procedure Laterality Date  . HERNIA REPAIR    . INSERTION OF MESH N/A 01/11/2014   Procedure: INSERTION OF MESH;  Surgeon: Ralene Ok, MD;  Location: [place];  Service: General;  Laterality: N/A;  . UMBILICAL HERNIA REPAIR N/A 01/11/2014   Procedure: LAPAROSCOPIC UMBILICAL HERNIA;  Surgeon: Ralene Ok, MD;  Location: [facility];  Service: General;  Laterality: N/A;    Family History  Problem Relation Age of Onset  . Cirrhosis Mother        Not alcoholic--not clear of etiology  . Ulcers Mother   . Alcohol abuse Father     No Known Allergies  Outpatient Medications Prior to Visit  Medication Sig Dispense Refill  . blood glucose meter kit and supplies KIT Dispense based on patient and insurance preference. Use up to four times daily as  directed. (FOR ICD-9 250.00, 250.01). (Patient not taking: No sig reported) 1 each 0  . omeprazole (PRILOSEC) 20 MG capsule Take 1 capsule (20 mg total) by mouth daily. (Patient not taking: No sig reported) 30 capsule 3  . atorvastatin (LIPITOR) 40 MG tablet Take 1 tablet (40 mg total) by mouth daily. (Patient not taking: No sig reported) 90 tablet 1  . glimepiride (AMARYL) 2 MG tablet Take 1 tablet (2 mg total) by mouth daily before breakfast. (Patient not taking: No sig reported) 30 tablet 11  . glucose blood (TRUE METRIX BLOOD GLUCOSE TEST) test strip Use three times a day. (Patient not taking: No sig reported) 100 each 11   No facility-administered medications prior to visit.     ROS Review of Systems  Constitutional: Positive for unexpected weight change. Negative for activity change, appetite change and fatigue.  HENT: Negative for congestion, sinus pressure and sore throat.   Eyes: Negative for visual disturbance.  Respiratory: Negative for cough, chest tightness, shortness of breath and wheezing.   Cardiovascular: Negative for chest pain and palpitations.  Gastrointestinal: Negative for abdominal distention, abdominal pain and constipation.  Endocrine: Negative for polydipsia.  Genitourinary: Negative for dysuria and frequency.  Musculoskeletal: Negative for arthralgias and back pain.  Skin: Negative for rash.  Neurological: Negative for tremors, light-headedness and numbness.  Hematological: Does not bruise/bleed easily.  Psychiatric/Behavioral: Negative for agitation and behavioral problems.  Objective:  BP 115/76   Pulse 83   Ht $R'5\' 5"'ze$  (1.651 m)   Wt 113 lb (51.3 kg)   SpO2 100%   BMI 18.80 kg/m   BP/Weight 11/07/2020 11/25/2019 01/27/3975  Systolic BP 734 193 790  Diastolic BP 76 89 90  Wt. (Lbs) 113 148.6 155.4  BMI 18.8 24.73 25.86      Physical Exam Constitutional:      Appearance: She is well-developed.  HENT:     Ears:     Comments: Medial aspect of  right tragus with furuncle, no discharge Neck:     Vascular: No JVD.  Cardiovascular:     Rate and Rhythm: Normal rate.     Heart sounds: Normal heart sounds. No murmur heard.   Pulmonary:     Effort: Pulmonary effort is normal.     Breath sounds: Normal breath sounds. No wheezing or rales.  Chest:     Chest wall: No tenderness.  Abdominal:     General: Bowel sounds are normal. There is no distension.     Palpations: Abdomen is soft. There is no mass.     Tenderness: There is no abdominal tenderness.  Musculoskeletal:        General: Normal range of motion.     Right lower leg: No edema.     Left lower leg: No edema.  Neurological:     Mental Status: She is alert and oriented to person, place, and time.  Psychiatric:        Mood and Affect: Mood normal.     CMP Latest Ref Rng & Units 07/10/2019 02/06/2019 04/17/2018  Glucose 65 - 99 mg/dL 113(H) 100(H) 290(H)  BUN 6 - 24 mg/dL $Remove'12 14 14  'KHVTppn$ Creatinine 0.57 - 1.00 mg/dL 0.75 0.70 0.84  Sodium 134 - 144 mmol/L 139 139 137  Potassium 3.5 - 5.2 mmol/L 4.2 4.1 3.6  Chloride 96 - 106 mmol/L 104 100 101  CO2 20 - 29 mmol/L 23 23 19(L)  Calcium 8.7 - 10.2 mg/dL 9.4 9.7 9.2  Total Protein 6.0 - 8.5 g/dL 7.6 7.1 7.8  Total Bilirubin 0.0 - 1.2 mg/dL 0.6 0.6 0.4  Alkaline Phos 39 - 117 IU/L 80 77 113  AST 0 - 40 IU/L $Remov'21 17 18  'uhIqZR$ ALT 0 - 32 IU/L $Remov'26 24 20    'vDxWbn$ Lipid Panel     Component Value Date/Time   CHOL 174 07/10/2019 1027   TRIG 407 (H) 07/10/2019 1027   HDL 36 (L) 07/10/2019 1027   CHOLHDL 4.8 (H) 07/10/2019 1027   LDLCALC 74 07/10/2019 1027    CBC    Component Value Date/Time   WBC 7.9 07/10/2019 1027   WBC 7.5 05/06/2017 1006   RBC 4.72 07/10/2019 1027   RBC 4.36 05/06/2017 1006   HGB 13.8 07/10/2019 1027   HGB 11.5 07/09/2016 0000   HCT 41.2 07/10/2019 1027   HCT 36 07/09/2016 0000   PLT 294 07/10/2019 1027   PLT 243 07/09/2016 0000   MCV 87 07/10/2019 1027   MCH 29.2 07/10/2019 1027   MCH 29.4 05/06/2017 1006    MCHC 33.5 07/10/2019 1027   MCHC 34.0 05/06/2017 1006   RDW 13.6 07/10/2019 1027   LYMPHSABS 2.8 07/10/2019 1027   MONOABS 0.4 05/06/2017 1006   EOSABS 0.3 07/10/2019 1027   BASOSABS 0.1 07/10/2019 1027    Lab Results  Component Value Date   HGBA1C >15 11/07/2020    Assessment & Plan:  1. Type 2 diabetes  mellitus with hyperglycemia, without long-term current use of insulin ([place]) Uncontrolled with A1c of greater than 15, was 6.1 in 2020; goal is less than 7.0 Ideally should be a candidate for insulin but declines at this time We will start off with glipizide and Metformin We will have her follow-up with the Pharm.D. to review blood sugar log and consider increasing Metformin dose at that visit if not at goal Counseled on Diabetic diet, my plate method, 096 minutes of moderate intensity exercise/week Blood sugar logs with fasting goals of 80-120 mg/dl, random of less than 180 and in the event of sugars less than 60 mg/dl or greater than 400 mg/dl encouraged to notify the clinic. Advised on the need for annual eye exams, annual foot exams, Pneumonia vaccine. - POCT glycosylated hemoglobin (Hb A1C) - POCT glucose (manual entry) - atorvastatin (LIPITOR) 40 MG tablet; Take 1 tablet (40 mg total) by mouth daily.  Dispense: 30 tablet; Refill: 3 - glipiZIDE (GLUCOTROL) 5 MG tablet; Take 1 tablet (5 mg total) by mouth 2 (two) times daily before a meal.  Dispense: 60 tablet; Refill: 3 - metFORMIN (GLUCOPHAGE) 500 MG tablet; Take 1 tablet (500 mg total) by mouth 2 (two) times daily with a meal.  Dispense: 60 tablet; Refill: 3 - glucose blood (TRUE METRIX BLOOD GLUCOSE TEST) test strip; Use three times a day.  Dispense: 100 each; Refill: 11 - TRUEplus Lancets 28G MISC; 1 each by Does not apply route 3 (three) times daily before meals.  Dispense: 100 each; Refill: 12  2. Vaginal itching Likely due to hyperglycemia Hopefully optimization of diabetes control will bring about improvement -  fluconazole (DIFLUCAN) 150 MG tablet; Take 1 tablet (150 mg total) by mouth once for 1 dose. Then repeat in 72 hours  Dispense: 2 tablet; Refill: 3  3. Weight loss Secondary to #1 We will also evaluate for thyroid problems - CBC with Differential/Platelet - T4, free - TSH - Basic Metabolic Panel  4. Furuncle Advised to apply warm compress - cephALEXin (KEFLEX) 500 MG capsule; Take 1 capsule (500 mg total) by mouth 2 (two) times daily.  Dispense: 14 capsule; Refill: 0   Meds ordered this encounter  Medications  . fluconazole (DIFLUCAN) 150 MG tablet    Sig: Take 1 tablet (150 mg total) by mouth once for 1 dose. Then repeat in 72 hours    Dispense:  2 tablet    Refill:  3  . cephALEXin (KEFLEX) 500 MG capsule    Sig: Take 1 capsule (500 mg total) by mouth 2 (two) times daily.    Dispense:  14 capsule    Refill:  0  . atorvastatin (LIPITOR) 40 MG tablet    Sig: Take 1 tablet (40 mg total) by mouth daily.    Dispense:  30 tablet    Refill:  3  . glipiZIDE (GLUCOTROL) 5 MG tablet    Sig: Take 1 tablet (5 mg total) by mouth 2 (two) times daily before a meal.    Dispense:  60 tablet    Refill:  3  . metFORMIN (GLUCOPHAGE) 500 MG tablet    Sig: Take 1 tablet (500 mg total) by mouth 2 (two) times daily with a meal.    Dispense:  60 tablet    Refill:  3  . glucose blood (TRUE METRIX BLOOD GLUCOSE TEST) test strip    Sig: Use three times a day.    Dispense:  100 each    Refill:  11  . TRUEplus  Lancets 28G MISC    Sig: 1 each by Does not apply route 3 (three) times daily before meals.    Dispense:  100 each    Refill:  12    Follow-up: Return in about 1 month (around 12/05/2020) for [place] with PCP for follow up of Diabetes; 1 week with Lurena Joiner -Diabetes.       Charlott Rakes, MD, FAAFP. [facility] [place], [place]   11/07/2020, 12:37 PM

## 2020-11-07 NOTE — Progress Notes (Signed)
Having pain in right ear. Has vaginal itching. Concerned about weight loss.

## 2020-11-08 LAB — BASIC METABOLIC PANEL
BUN/Creatinine Ratio: 18 (ref 9–23)
BUN: 12 mg/dL (ref 6–24)
CO2: 20 mmol/L (ref 20–29)
Calcium: 9.4 mg/dL (ref 8.7–10.2)
Chloride: 95 mmol/L — ABNORMAL LOW (ref 96–106)
Creatinine, Ser: 0.68 mg/dL (ref 0.57–1.00)
GFR calc Af Amer: 124 mL/min/{1.73_m2} (ref >59)
GFR calc non Af Amer: 107 mL/min/{1.73_m2} (ref >59)
Glucose: 300 mg/dL — ABNORMAL HIGH (ref 65–99)
Potassium: 4 mmol/L (ref 3.5–5.2)
Sodium: 136 mmol/L (ref 134–144)

## 2020-11-08 LAB — CBC WITH DIFFERENTIAL/PLATELET
Basophils Absolute: 0 10*3/uL (ref 0.0–0.2)
Basos: 1 %
EOS (ABSOLUTE): 0.1 10*3/uL (ref 0.0–0.4)
Eos: 2 %
Hematocrit: 42.2 % (ref 34.0–46.6)
Hemoglobin: 13.9 g/dL (ref 11.1–15.9)
Immature Grans (Abs): 0 10*3/uL (ref 0.0–0.1)
Immature Granulocytes: 0 %
Lymphocytes Absolute: 2.4 10*3/uL (ref 0.7–3.1)
Lymphs: 43 %
MCH: 29.7 pg (ref 26.6–33.0)
MCHC: 32.9 g/dL (ref 31.5–35.7)
MCV: 90 fL (ref 79–97)
Monocytes Absolute: 0.2 10*3/uL (ref 0.1–0.9)
Monocytes: 4 %
Neutrophils Absolute: 2.8 10*3/uL (ref 1.4–7.0)
Neutrophils: 50 %
Platelets: 261 10*3/uL (ref 150–450)
RBC: 4.68 x10E6/uL (ref 3.77–5.28)
RDW: 12.9 % (ref 11.7–15.4)
WBC: 5.6 10*3/uL (ref 3.4–10.8)

## 2020-11-08 LAB — TSH: TSH: 1.46 u[IU]/mL (ref 0.450–4.500)

## 2020-11-08 LAB — T4, FREE: Free T4: 1.33 ng/dL (ref 0.82–1.77)

## 2020-11-22 NOTE — Progress Notes (Unsigned)
    S:     PCP: Gwinda Passe PMH: gestational diabetes, HTN, anxiety, heart palpitations  Patient arrives in good spirits. Presents for diabetes evaluation, education, and management. Patient was referred and last seen by Dr. Alvis Lemmings on 11/08/19. At that visit, BG was 283 and A1C >15% (previously 6.1% in 2020). Pt reported unexplained 42 lb weight loss within the last year. Pt declined starting insulin, therefore, she was initiated on glipizide 5 mg BID and metformin 500 mg BID.  Today, patient reports medication adherence with metformin and glipizide twice daily. Reports mild nausea and blurry vision. Reports home BG 275-300s. Denies BG <70.  Family/Social History:  -Fhx: cirrhosis (mother), ulcers (mother), alcohol abuse (father) -Tobacco use  Insurance coverage/medication affordability: dnbi  Medication adherence reported fair.   Current diabetes medications include: metformin 500 mg BID, glipizide 5 mg BID  Current hypertension medications include: none  Current hyperlipidemia medications include: atorvastatin 40 mg daily  Patient denies hypoglycemic events.  Patient reported dietary habits: will address at next visit  Patient-reported exercise habits: will address at next visit  O:  POCT: 313 (post prandial)  Home fasting blood sugars: 275, 280, 300 2 hour post-meal/random blood sugars: 275  Lab Results  Component Value Date   HGBA1C >15 11/07/2020   There were no vitals filed for this visit.  Lipid Panel     Component Value Date/Time   CHOL 174 07/10/2019 1027   TRIG 407 (H) 07/10/2019 1027   HDL 36 (L) 07/10/2019 1027   CHOLHDL 4.8 (H) 07/10/2019 1027   LDLCALC 74 07/10/2019 1027    Clinical Atherosclerotic Cardiovascular Disease (ASCVD): No  The 10-year ASCVD risk score Denman George DC Jr., et al., 2013) is: 1.8%   Values used to calculate the score:     Age: 45 years     Sex: Female     Is Non-Hispanic African American: No     Diabetic: Yes     Tobacco  smoker: No     Systolic Blood Pressure: 115 mmHg     Is BP treated: No     HDL Cholesterol: 36 mg/dL     Total Cholesterol: 174 mg/dL    A/P: Diabetes longstanding currently uncontrolled. Patient is able to verbalize appropriate hypoglycemia management plan. Medication adherence appears optimal. Discussed initiating insulin today given sugars 275-300s, however pt wants to first see if oral meds will lower sugars. Patient amenable to starting insulin at next visit if sugars remain uncontrolled.  -Switch metformin IR to XR and increased to 1000 mg twice daily  -Continued glipizide 5 mg BID  -Extensively discussed pathophysiology of diabetes, recommended lifestyle interventions, dietary effects on blood sugar control -Counseled on s/sx of and management of hypoglycemia -Next A1C anticipated May 2022   ASCVD risk - primary prevention in patient with diabetes. Last LDL is controlled. ASCVD risk score is not >20%  - moderate intensity statin indicated. -Continued atorvastatin 40 mg daily.   Written patient instructions provided.  Total time in face to face counseling 25 minutes.   Follow up Pharmacist Clinic Visit in 2 weeks.    Fabio Neighbors, PharmD, BCPS PGY2 Ambulatory Care Resident [facility]

## 2020-11-23 ENCOUNTER — Other Ambulatory Visit: Payer: Self-pay | Admitting: Primary Care

## 2020-11-23 ENCOUNTER — Ambulatory Visit: Payer: Self-pay | Attending: Family Medicine | Admitting: Pharmacist

## 2020-11-23 ENCOUNTER — Other Ambulatory Visit: Payer: Self-pay

## 2020-11-23 DIAGNOSIS — E1165 Type 2 diabetes mellitus with hyperglycemia: Secondary | ICD-10-CM

## 2020-11-23 LAB — GLUCOSE, POCT (MANUAL RESULT ENTRY): POC Glucose: 313 mg/dl — AB (ref 70–99)

## 2020-11-23 MED ORDER — METFORMIN HCL ER 500 MG PO TB24: 1000.0000 mg | ORAL_TABLET | Freq: Two times a day (BID) | ORAL | 11 refills | Status: DC

## 2020-11-23 MED FILL — metFORMIN HCL ER 500 MG TB2: 500 | 30 days supply | Qty: 120 | Fill #0

## 2020-12-01 ENCOUNTER — Other Ambulatory Visit: Payer: Self-pay

## 2020-12-01 ENCOUNTER — Ambulatory Visit: Payer: No Typology Code available for payment source | Attending: Primary Care

## 2020-12-05 ENCOUNTER — Ambulatory Visit (INDEPENDENT_AMBULATORY_CARE_PROVIDER_SITE_OTHER): Payer: No Typology Code available for payment source | Admitting: Primary Care

## 2020-12-05 NOTE — Addendum Note (Signed)
Addended by: Grayce Sessions on: 12/05/2020 09:48 PM   Modules accepted: Level of Service

## 2020-12-13 ENCOUNTER — Ambulatory Visit: Payer: No Typology Code available for payment source | Admitting: Pharmacist

## 2020-12-14 ENCOUNTER — Ambulatory Visit (INDEPENDENT_AMBULATORY_CARE_PROVIDER_SITE_OTHER): Payer: No Typology Code available for payment source | Admitting: Primary Care

## 2020-12-21 ENCOUNTER — Ambulatory Visit: Payer: Self-pay | Attending: Primary Care | Admitting: Pharmacist

## 2020-12-21 ENCOUNTER — Other Ambulatory Visit: Payer: Self-pay

## 2020-12-21 ENCOUNTER — Encounter: Payer: Self-pay | Admitting: Pharmacist

## 2020-12-21 DIAGNOSIS — E1165 Type 2 diabetes mellitus with hyperglycemia: Secondary | ICD-10-CM

## 2020-12-21 LAB — GLUCOSE, POCT (MANUAL RESULT ENTRY): POC Glucose: 160 mg/dl — AB (ref 70–99)

## 2020-12-21 MED FILL — ?ATORVASTATIN 40MG TABLET: 40 | 30 days supply | Qty: 30 | Fill #1

## 2020-12-21 MED FILL — ?METFORMIN HCL 500MG TABLET: 500 | 30 days supply | Qty: 60 | Fill #1

## 2020-12-21 MED FILL — TRUEplus LANCETS 28G MISC: 33 days supply | Qty: 100 | Fill #1

## 2020-12-21 MED FILL — TRUE METRIX GLUCOSE TEST ST: 33 days supply | Qty: 100 | Fill #1

## 2020-12-21 MED FILL — glipiZIDE 5 MG TABS: 5 | 30 days supply | Qty: 60 | Fill #1

## 2020-12-21 NOTE — Progress Notes (Signed)
S:    PCP: Gwinda Passe PMH: gestational diabetes, HTN, anxiety, heart palpitations  Patient arrives in good spirits. Presents for diabetes evaluation, education, and management. Patient was referred and last seen by Dr. Alvis Lemmings on 11/08/19. Last seen by CPP on 11/23/20. At that visit, metformin was switched from IR to XR and increased to 1000 mg BID and glipizide was continued. BG was 313 and pt reported some mild nausea and blurry vision.  Pt has declined starting insulin even though A1C is >15%.  Today, patient reports medication adherence with metformin and glipizide twice daily. Reports some stomachache since starting new metformin dose but states that her blurry vision has resolved. Does report she had a reading of 75 and felt nervous and felt like she was going to vomit. Also says she gets this "nervous feeling" when her BG is in the 90s and reports some numbness in her left hand.  Family/Social History:  -Fhx: cirrhosis (mother), ulcers (mother), alcohol abuse (father) -Tobacco use  Insurance coverage/medication affordability: dnbi  Medication adherence reported fair.   Current diabetes medications include: metformin 1000 mg BID (takes two XR 500mg  tablets BID), glipizide 5 mg BID  Current hypertension medications include: none  Current hyperlipidemia medications include: atorvastatin 40 mg daily.  Patient reported dietary habits: will address at next visit  Patient-reported exercise habits: will address at next visit  O:  POCT: 160 (fasting)  Home fasting blood sugars: 95, 105, 150 2 hour post-meal/random blood sugars: n/a  Lab Results  Component Value Date   HGBA1C >15 11/07/2020   There were no vitals filed for this visit.  Lipid Panel     Component Value Date/Time   CHOL 174 07/10/2019 1027   TRIG 407 (H) 07/10/2019 1027   HDL 36 (L) 07/10/2019 1027   CHOLHDL 4.8 (H) 07/10/2019 1027   LDLCALC 74 07/10/2019 1027    Clinical Atherosclerotic  Cardiovascular Disease (ASCVD): No  The 10-year ASCVD risk score 07/12/2019 DC Jr., et al., 2013) is: 1.8%   Values used to calculate the score:     Age: 45 years     Sex: Female     Is Non-Hispanic African American: No     Diabetic: Yes     Tobacco smoker: No     Systolic Blood Pressure: 115 mmHg     Is BP treated: No     HDL Cholesterol: 36 mg/dL     Total Cholesterol: 174 mg/dL    A/P: Diabetes longstanding currently uncontrolled. Patient is able to verbalize appropriate hypoglycemia management plan. Medication adherence appears optimal. Pt has greatly improved fasting sugars at home and also here in clinic so no changes made today. Emphasized continuing to be adherent with medications. Counseled that "nervous feeling" was most likely due to her body getting used to a lower BG and should resolve in the coming weeks. -Continued metformin XR 1000 mg BID.  -Continued glipizide 5 mg BID.  -Extensively discussed pathophysiology of diabetes, recommended lifestyle interventions, dietary effects on blood sugar control -Counseled on s/sx of and management of hypoglycemia -Next A1C anticipated May 2022   ASCVD risk - primary prevention in patient with diabetes. Last LDL is controlled. ASCVD risk score is not >20%  - moderate intensity statin indicated. -Continued atorvastatin 40 mg daily.  -Lipid  Written patient instructions provided.  Total time in face to face counseling 20 minutes.   Follow up Pharmacist Clinic Visit in 2 weeks.    June 2022, PharmD Candidate [employer]  2024  Butch Penny, PharmD, Patsy [place], CPP Clinical Pharmacist [facility] [phone]

## 2020-12-22 LAB — LIPID PANEL
Chol/HDL Ratio: 4.5 ratio — ABNORMAL HIGH (ref 0.0–4.4)
Cholesterol, Total: 197 mg/dL (ref 100–199)
HDL: 44 mg/dL (ref >39)
LDL Chol Calc (NIH): 102 mg/dL — ABNORMAL HIGH (ref 0–99)
Triglycerides: 298 mg/dL — ABNORMAL HIGH (ref 0–149)
VLDL Cholesterol Cal: 51 mg/dL — ABNORMAL HIGH (ref 5–40)

## 2020-12-23 ENCOUNTER — Other Ambulatory Visit (INDEPENDENT_AMBULATORY_CARE_PROVIDER_SITE_OTHER): Payer: Self-pay | Admitting: Primary Care

## 2020-12-23 ENCOUNTER — Telehealth (INDEPENDENT_AMBULATORY_CARE_PROVIDER_SITE_OTHER): Payer: Self-pay

## 2020-12-23 DIAGNOSIS — E7841 Elevated Lipoprotein(a): Secondary | ICD-10-CM

## 2020-12-23 MED ORDER — FENOFIBRATE 145 MG PO TABS: 145.0000 mg | ORAL_TABLET | Freq: Every day | ORAL | 1 refills | Status: DC

## 2020-12-23 NOTE — Telephone Encounter (Signed)
-----   Message from Grayce Sessions, NP sent at 12/23/2020  8:39 AM EDT ----- Change cholesterol medication to tricor 148mg  nightly . She can continue atorvastatin until gone. Decrease fatty food in diet

## 2020-12-23 NOTE — Telephone Encounter (Signed)
Call placed to patient with assistance of pacific interpreter [phone]. Patient is aware of cholesterol medication being changed. Continue atorvastatin until complete then start new prescription. She verbalized understanding. Maryjean Morn, CMA

## 2020-12-24 ENCOUNTER — Other Ambulatory Visit: Payer: Self-pay

## 2020-12-26 ENCOUNTER — Other Ambulatory Visit: Payer: Self-pay

## 2020-12-26 ENCOUNTER — Ambulatory Visit (INDEPENDENT_AMBULATORY_CARE_PROVIDER_SITE_OTHER): Payer: Self-pay | Admitting: Primary Care

## 2020-12-26 ENCOUNTER — Encounter (INDEPENDENT_AMBULATORY_CARE_PROVIDER_SITE_OTHER): Payer: Self-pay | Admitting: Primary Care

## 2020-12-26 VITALS — BP 108/75 | HR 76 | Temp 97.7°F | Ht 65.0 in | Wt 128.0 lb

## 2020-12-26 DIAGNOSIS — E119 Type 2 diabetes mellitus without complications: Secondary | ICD-10-CM

## 2020-12-26 DIAGNOSIS — J301 Allergic rhinitis due to pollen: Secondary | ICD-10-CM

## 2020-12-26 LAB — GLUCOSE, POCT (MANUAL RESULT ENTRY): POC Glucose: 116 mg/dl — AB (ref 70–99)

## 2020-12-26 MED ORDER — LORATADINE 10 MG PO TABS
10.0000 mg | ORAL_TABLET | Freq: Every day | ORAL | 11 refills | Status: DC
  Filled 2020-12-26: qty 30, 30d supply, fill #0

## 2020-12-26 MED ORDER — FLUTICASONE PROPIONATE 50 MCG/ACT NA SUSP
2.0000 | Freq: Every day | NASAL | 6 refills | Status: DC
  Filled 2020-12-26: qty 16, 30d supply, fill #0

## 2020-12-26 NOTE — Patient Instructions (Signed)
Diabetes mellitus y nutricin, en adultos Diabetes Mellitus and Nutrition, Adult Si sufre de diabetes, o diabetes mellitus, es muy importante tener hbitos alimenticios saludables debido a que sus niveles de azcar en la sangre (glucosa) se ven afectados en gran medida por lo que come y bebe. Comer alimentos saludables en las cantidades correctas, aproximadamente a la misma hora todos los das, lo ayudar a: Controlar la glucemia. Disminuir el riesgo de sufrir una enfermedad cardaca. Mejorar la presin arterial. Alcanzar o mantener un peso saludable. Qu puede afectar mi plan de alimentacin? Todas las personas que sufren de diabetes son diferentes y cada una tiene necesidades diferentes en cuanto a un plan de alimentacin. El mdico puede recomendarle que trabaje con un nutricionista para elaborar el mejor plan para usted. Su plan de alimentacin puede variar segn factores como: Las caloras que necesita. Los medicamentos que toma. Su peso. Sus niveles de glucemia, presin arterial y colesterol. Su nivel de actividad. Otras afecciones que tenga, como enfermedades cardacas o renales. Cmo me afectan los carbohidratos? Los carbohidratos, o hidratos de carbono, afectan su nivel de glucemia ms que cualquier otro tipo de alimento. La ingesta de carbohidratos naturalmente aumenta la cantidad de glucosa en la sangre. El recuento de carbohidratos es un mtodo destinado a llevar un registro de la cantidad de carbohidratos que se consumen. El recuento de carbohidratos es importante para mantener la glucemia a un nivel saludable, especialmente si utiliza insulina o toma determinados medicamentos por va oral para la diabetes. Es importante conocer la cantidad de carbohidratos que se pueden ingerir en cada comida sin correr ningn riesgo. Esto es diferente en cada persona. Su nutricionista puede ayudarlo a calcular la cantidad de carbohidratos que debe ingerir en cada comida y en cada refrigerio. Cmo  me afecta el alcohol? El alcohol puede provocar disminuciones sbitas de la glucemia (hipoglucemia), especialmente si utiliza insulina o toma determinados medicamentos por va oral para la diabetes. La hipoglucemia es una afeccin potencialmente mortal. Los sntomas de la hipoglucemia, como somnolencia, mareos y confusin, son similares a los sntomas de haber consumido demasiado alcohol. No beba alcohol si: Su mdico le indica no hacerlo. Est embarazada, puede estar embarazada o est tratando de quedar embarazada. Si bebe alcohol: No beba con el estmago vaco. Limite la cantidad que bebe: De 0 a 1 medida por da para las mujeres. De 0 a 2 medidas por da para los hombres. Est atento a la cantidad de alcohol que hay en las bebidas que toma. En los [place], una medida equivale a una botella de cerveza de 12 oz (355 ml), un vaso de vino de 5 oz (148 ml) o un vaso de una bebida alcohlica de alta graduacin de 1 oz (44 ml). Mantngase hidratado bebiendo agua, refrescos dietticos o t helado sin azcar. Tenga en cuenta que los refrescos comunes, los jugos y otras bebida para mezclar pueden contener mucha azcar y se deben contar como carbohidratos. Consejos para seguir este plan Leer las etiquetas de los alimentos Comience por leer el tamao de la porcin en la "Informacin nutricional" en las etiquetas de los alimentos envasados y las bebidas. La cantidad de caloras, carbohidratos, grasas y otros nutrientes mencionados en la etiqueta se basan en una porcin del alimento. Muchos alimentos contienen ms de una porcin por envase. Verifique la cantidad total de gramos (g) de carbohidratos totales en una porcin. Puede calcular la cantidad de porciones de carbohidratos al dividir el total de carbohidratos por 15. Por ejemplo, si un alimento tiene un   total de 30 g de carbohidratos totales por porcin, equivale a 2 porciones de carbohidratos. Verifique la cantidad de gramos (g) de grasas  saturadas y grasas trans de una porcin. Escoja alimentos que no contengan estas grasas o que su contenido de estas sea bajo. Verifique la cantidad de miligramos (mg) de sal (sodio) en una porcin. La mayora de las personas deben limitar la ingesta de sodio total a menos de 2300 mg por da. Siempre consulte la informacin nutricional de los alimentos etiquetados como "con bajo contenido de grasa" o "sin grasa". Estos alimentos pueden tener un mayor contenido de azcar agregada o carbohidratos refinados, y deben evitarse. Hable con su nutricionista para identificar sus objetivos diarios en cuanto a los nutrientes mencionados en la etiqueta. Al ir de compras Evite comprar alimentos procesados, enlatados o precocidos. Estos alimentos tienden a tener una mayor cantidad de grasa, sodio y azcar agregada. Compre en la zona exterior de la tienda de comestibles. Esta es la zona donde se encuentran con mayor frecuencia las frutas y las verduras frescas, los cereales a granel, las carnes frescas y los productos lcteos frescos. Al cocinar Utilice mtodos de coccin a baja temperatura, como hornear, en lugar de mtodos de coccin a alta temperatura, como frer en abundante aceite. Cocine con aceites saludables, como el aceite de oliva, canola o girasol. Evite cocinar con manteca, crema o carnes con alto contenido de grasa. Planificacin de las comidas Coma las comidas y los refrigerios regularmente, preferentemente a la misma hora todos los das. Evite pasar largos perodos de tiempo sin comer. Consuma alimentos ricos en fibra, como frutas frescas, verduras, frijoles y cereales integrales. Consulte a su nutricionista sobre cuntas porciones de carbohidratos puede consumir en cada comida. Consuma entre 4 y 6 onzas (entre 112 y 168 g) de protenas magras por da, como carnes magras, pollo, pescado, huevos o tofu. Una onza (oz) de protena magra equivale a: 1 onza (28 g) de carne, pollo o pescado. 1 huevo.  de  taza (62 g) de tofu. Coma algunos alimentos por da que contengan grasas saludables, como aguacates, frutos secos, semillas y pescado. Qu alimentos debo comer? Frutas Bayas. Manzanas. Naranjas. Duraznos. Damascos. Ciruelas. Uvas. Mango. Papaya. Granada. Kiwi. Cerezas. Verduras Lechuga. Espinaca. Verduras de hoja verde, que incluyen col rizada, acelga, hojas de berza y de mostaza. Remolachas. Coliflor. Repollo. Brcoli. Zanahorias. Judas verdes. Tomates. Pimientos. Cebollas. Pepinos. Coles de Bruselas. Granos Granos integrales, como panes, galletas, tortillas, cereales y pastas de salvado o integrales. Avena sin azcar. Quinua. Arroz integral o salvaje. Carnes y otras protenas Mariscos. Carne de ave sin piel. Cortes magros de ave y carne de res. Tofu. Frutos secos. Semillas. Lcteos Productos lcteos sin grasa o con bajo contenido de grasa, como leche, yogur y queso. Es posible que los productos que se enumeran ms arriba no constituyan una lista completa de los alimentos y las bebidas que puede tomar. Consulte a un nutricionista para obtener ms informacin. Qu alimentos debo evitar? Frutas Frutas enlatadas al almbar. Verduras Verduras enlatadas. Verduras congeladas con mantequilla o salsa de crema. Granos Productos elaborados con harina y harina blanca refinada, como panes, pastas, bocadillos y cereales. Evite todos los alimentos procesados. Carnes y otras protenas Cortes de carne con alto contenido de grasa. Carne de ave con piel. Carnes empanizadas o fritas. Carne procesada. Evite las grasas saturadas. Lcteos Yogur, queso o leche enteros. Bebidas Bebidas azucaradas, como gaseosas o t helado. Es posible que los productos que se enumeran ms arriba no constituyan una lista completa de   los alimentos y las bebidas que debe evitar. Consulte a un nutricionista para obtener ms informacin. Preguntas para hacerle al mdico Es necesario que me rena con un instructor en el cuidado  de la diabetes? Es necesario que me rena con un nutricionista? A qu nmero puedo llamar si tengo preguntas? Cules son los mejores momentos para controlar la glucemia? Dnde encontrar ms informacin: Asociacin Estadounidense de la Diabetes (American Diabetes Association): diabetes.org Academy of Nutrition and Dietetics (Academia de Nutricin y Diettica): www.eatright.org National Institute of Diabetes and Digestive and Kidney Diseases (Instituto Nacional de la Diabetes y las Enfermedades Digestivas y Renales): www.niddk.nih.gov Association of Diabetes Care and Education Specialists (Asociacin de Especialistas en Atencin y Educacin sobre la Diabetes): www.diabeteseducator.org Resumen Es importante tener hbitos alimenticios saludables debido a que sus niveles de azcar en la sangre (glucosa) se ven afectados en gran medida por lo que come y bebe. Un plan de alimentacin saludable lo ayudar a controlar la glucemia y mantener un estilo de vida saludable. El mdico puede recomendarle que trabaje con un nutricionista para elaborar el mejor plan para usted. Tenga en cuenta que los carbohidratos (hidratos de carbono) y el alcohol tienen efectos inmediatos en sus niveles de glucemia. Es importante contar los carbohidratos que ingiere y consumir alcohol con prudencia. Esta informacin no tiene como fin reemplazar el consejo del mdico. Asegrese de hacerle al mdico cualquier pregunta que tenga. Document Revised: 10/15/2019 Document Reviewed: 10/15/2019 Elsevier Patient Education  2021 Elsevier Inc.  

## 2020-12-26 NOTE — Progress Notes (Signed)
Subjective:  Patient ID: [name] Perry, female    DOB: Mar 25, 1976  Age: 45 y.o. MRN: [id number]  CC: Medication Management (DM/levemir)   HPI Ms. [name] Perry is a 45 year old Hispanic female(Myrna interpretor [phone]) presents for Follow-up of diabetes. Patient does check blood sugar at home. Recently seen Clinical pharmacist and adjusted her medication  Compliant with meds - Yes Checking CBGs? Yes  Fasting avg - 90-150 Exercising regularly? - No Watching carbohydrate intake? - Yes Neuropathy ? - No Hypoglycemic events - No  - Recovers with :   Pertinent ROS:  Polyuria - No Polydipsia - No Vision problems - No  Medications as noted below. Taking them regularly without complication/adverse reaction being reported today.   History [name] Perry has a past medical history of Anxiety, Ectopic pregnancy (march), Gestational diabetes, Heart palpitations, and Hypertension (2010).   She has a past surgical history that includes Umbilical hernia repair (N/A, 01/11/2014); Insertion of mesh (N/A, 01/11/2014); and Hernia repair.   Her family history includes Alcohol abuse in her father; Cirrhosis in her mother; Ulcers in her mother.She reports that she has never smoked. She has never used smokeless tobacco. She reports that she does not drink alcohol and does not use drugs.  Current Outpatient Medications on File Prior to Visit  Medication Sig Dispense Refill  . blood glucose meter kit and supplies KIT Dispense based on patient and insurance preference. Use up to four times daily as directed. (FOR ICD-9 250.00, 250.01). 1 each 0  . fenofibrate (TRICOR) 145 MG tablet TAKE 1 TABLET (145 MG TOTAL) BY MOUTH DAILY. 90 tablet 1  . fluconazole (DIFLUCAN) 150 MG tablet TAKE 1 TABLET BY MOUTH FOR 1 DOSE MAY REPEAT IN 72 HOURS 2 tablet 3  . glipiZIDE (GLUCOTROL) 5 MG tablet TAKE 1 TABLET (5 MG TOTAL) BY MOUTH 2 (TWO) TIMES DAILY BEFORE A MEAL. 60 tablet 3  . glucose blood test strip USE AS  DIRECTED 3 TIMES DAILY (Patient taking differently: USE AS DIRECTED 3 TIMES DAILY) 100 strip 11  . metFORMIN (GLUCOPHAGE) 500 MG tablet TAKE 1 TABLET (500 MG TOTAL) BY MOUTH 2 (TWO) TIMES DAILY WITH A MEAL. 60 tablet 3  . metFORMIN (GLUCOPHAGE-XR) 500 MG 24 hr tablet TAKE 2 TABLETS (1,000 MG TOTAL) BY MOUTH 2 (TWO) TIMES DAILY WITH BREAKFAST AND LUNCH. 120 tablet 11  . TRUEplus Lancets 28G MISC USE AS DIRECTED 3 TIMES DAILY 100 each 12  . omeprazole (PRILOSEC) 20 MG capsule Take 1 capsule (20 mg total) by mouth daily. (Patient not taking: No sig reported) 30 capsule 3  . [DISCONTINUED] atorvastatin (LIPITOR) 40 MG tablet Take 1 tablet (40 mg total) by mouth daily. 30 tablet 3   No current facility-administered medications on file prior to visit.    ROS Review of Systems  HENT: Positive for postnasal drip and sneezing.   All other systems reviewed and are negative.   Objective:  BP 108/75 (BP Location: Right Arm, Patient Position: Sitting, Cuff Size: Normal)   Pulse 76   Temp 97.7 F (36.5 C) (Temporal)   Ht $R'5\' 5"'aX$  (1.651 m)   Wt 128 lb (58.1 kg)   SpO2 97%   BMI 21.30 kg/m   BP Readings from Last 3 Encounters:  12/26/20 108/75  11/07/20 115/76  11/25/19 130/89    Wt Readings from Last 3 Encounters:  12/26/20 128 lb (58.1 kg)  11/07/20 113 lb (51.3 kg)  11/25/19 148 lb 9.6 oz (67.4 kg)    Physical Exam Vitals  reviewed.  Constitutional:      Appearance: Normal appearance.  HENT:     Head: Normocephalic.     Right Ear: External ear normal.     Left Ear: External ear normal.     Nose: Nose normal.  Eyes:     Extraocular Movements: Extraocular movements intact.  Cardiovascular:     Rate and Rhythm: Normal rate and regular rhythm.  Pulmonary:     Effort: Pulmonary effort is normal.     Breath sounds: Normal breath sounds.  Abdominal:     General: Abdomen is flat. Bowel sounds are normal.     Palpations: Abdomen is soft.  Musculoskeletal:        General: Normal  range of motion.     Cervical back: Normal range of motion.  Skin:    General: Skin is warm and dry.  Neurological:     Mental Status: She is alert and oriented to person, place, and time.  Psychiatric:        Mood and Affect: Mood normal.        Behavior: Behavior normal.        Thought Content: Thought content normal.        Judgment: Judgment normal.     Lab Results  Component Value Date   HGBA1C >15 11/07/2020   HGBA1C 6.1 (A) 07/10/2019   HGBA1C 5.6 02/06/2019    Lab Results  Component Value Date   WBC 5.6 11/07/2020   HGB 13.9 11/07/2020   HCT 42.2 11/07/2020   PLT 261 11/07/2020   GLUCOSE 300 (H) 11/07/2020   CHOL 197 12/21/2020   TRIG 298 (H) 12/21/2020   HDL 44 12/21/2020   LDLCALC 102 (H) 12/21/2020   ALT 26 07/10/2019   AST 21 07/10/2019   NA 136 11/07/2020   K 4.0 11/07/2020   CL 95 (L) 11/07/2020   CREATININE 0.68 11/07/2020   BUN 12 11/07/2020   CO2 20 11/07/2020   TSH 1.460 11/07/2020   HGBA1C >15 11/07/2020     Assessment & Plan:   [name] Perry was seen today for medication management.  Diagnoses and all orders for this visit:  Type 2 diabetes mellitus without complication, without long-term current use of insulin (HCC) - Goal of therapy: Less than 6.5 hemoglobin A1c.  Decrease foods that are high in carbohydrates are the following rice, potatoes, breads, sugars, and pastas.  Reduction in the intake (eating) will assist in lowering your blood sugars. Discussed  co- morbidities with uncontrol diabetes  Complications -diabetic retinopathy, (close your eyes ? What do you see nothing) nephropathy decrease in kidney function- can lead to dialysis-on a machine 3 days a week to filter your kidney, neuropathy- numbness and tinging in your hands and feet,  increase risk of heart attack and stroke, and amputation due to decrease wound healing and circulation. Decrease your risk by taking medication daily as prescribed, monitor carbohydrates- foods that are high  in carbohydrates are the following rice, potatoes, breads, sugars, and pastas.  Reduction in the intake (eating) will assist in lowering your blood sugars. Exercise daily at least 30 minutes daily. Ambulatory referral to Ophthalmology  Encounter for diabetic foot exam [facility]) Completed   Seasonal allergic rhinitis due to pollen  Use Flonase nasal spray for at least duration of your allergy season.  - For appropriate administration of the nasal spray, clear the nose, use opposite hand for opposite nare, sniff gently, exhale through your mouth. - For maximal effect take these two nasal sprays  at least 30 minutes apart. Claritin  each day, as needed. - Drink at least 64 ounces of water each day.  - If I have discontinued Bricia Mejia-Perez's cephALEXin. I am also having her start on loratadine and fluticasone. Additionally, I am having her maintain her blood glucose meter kit and supplies, omeprazole, fenofibrate, metFORMIN, TRUEplus Lancets 28G, glucose blood, metFORMIN, glipiZIDE, and fluconazole.  Meds ordered this encounter  Medications  . loratadine (CLARITIN) 10 MG tablet    Sig: Take 1 tablet (10 mg total) by mouth daily.    Dispense:  30 tablet    Refill:  11  . fluticasone (FLONASE) 50 MCG/ACT nasal spray    Sig: Place 2 sprays into both nostrils daily.    Dispense:  16 g    Refill:  6     Follow-up:   Return in about 3 months (around 03/27/2021) for DM f/u.  The above assessment and management plan was discussed with the patient. The patient verbalized understanding of and has agreed to the management plan. Patient is aware to call the clinic if symptoms fail to improve or worsen. Patient is aware when to return to the clinic for a follow-up visit. Patient educated on when it is appropriate to go to the emergency department.   Juluis Mire, NP-C

## 2020-12-27 LAB — MICROALBUMIN, URINE: Microalbumin, Urine: 9.3 ug/mL

## 2020-12-30 ENCOUNTER — Telehealth (INDEPENDENT_AMBULATORY_CARE_PROVIDER_SITE_OTHER): Payer: Self-pay

## 2020-12-30 NOTE — Telephone Encounter (Signed)
Contacted patient with pacific interpreter [phone]) patient is aware of normal micro albumin. Maryjean Morn, CMA

## 2020-12-30 NOTE — Telephone Encounter (Signed)
-----   Message from Grayce Sessions, NP sent at 12/28/2020  1:59 PM EDT ----- Microalbumin, Urine- normal

## 2021-01-02 ENCOUNTER — Other Ambulatory Visit: Payer: Self-pay

## 2021-01-17 ENCOUNTER — Other Ambulatory Visit: Payer: Self-pay

## 2021-01-17 MED FILL — Fenofibrate Tab 145 MG: ORAL | 30 days supply | Qty: 30 | Fill #0 | Status: AC

## 2021-02-13 ENCOUNTER — Other Ambulatory Visit: Payer: Self-pay

## 2021-02-13 MED FILL — Glipizide Tab 5 MG: ORAL | 30 days supply | Qty: 60 | Fill #0 | Status: AC

## 2021-02-13 MED FILL — Glucose Blood Test Strip: 30 days supply | Qty: 100 | Fill #0 | Status: AC

## 2021-02-13 MED FILL — Metformin HCl Tab 500 MG: ORAL | 30 days supply | Qty: 60 | Fill #0 | Status: AC

## 2021-02-13 MED FILL — Lancets: 30 days supply | Qty: 100 | Fill #0 | Status: AC

## 2021-03-28 ENCOUNTER — Ambulatory Visit (INDEPENDENT_AMBULATORY_CARE_PROVIDER_SITE_OTHER): Payer: No Typology Code available for payment source | Admitting: Primary Care

## 2021-04-10 ENCOUNTER — Other Ambulatory Visit: Payer: Self-pay

## 2021-04-10 MED FILL — Glipizide Tab 5 MG: ORAL | 30 days supply | Qty: 60 | Fill #1 | Status: AC

## 2021-04-10 MED FILL — Glucose Blood Test Strip: 30 days supply | Qty: 100 | Fill #1 | Status: AC

## 2021-04-10 MED FILL — Metformin HCl Tab 500 MG: ORAL | 30 days supply | Qty: 60 | Fill #1 | Status: AC

## 2021-04-11 ENCOUNTER — Ambulatory Visit (INDEPENDENT_AMBULATORY_CARE_PROVIDER_SITE_OTHER): Payer: No Typology Code available for payment source | Admitting: Primary Care

## 2021-04-20 ENCOUNTER — Other Ambulatory Visit: Payer: Self-pay

## 2021-04-20 ENCOUNTER — Encounter (INDEPENDENT_AMBULATORY_CARE_PROVIDER_SITE_OTHER): Payer: Self-pay | Admitting: Primary Care

## 2021-04-20 ENCOUNTER — Ambulatory Visit (INDEPENDENT_AMBULATORY_CARE_PROVIDER_SITE_OTHER): Payer: Self-pay | Admitting: Primary Care

## 2021-04-20 VITALS — BP 142/91 | HR 68 | Temp 97.5°F | Resp 16 | Wt 140.0 lb

## 2021-04-20 DIAGNOSIS — I10 Essential (primary) hypertension: Secondary | ICD-10-CM

## 2021-04-20 DIAGNOSIS — E119 Type 2 diabetes mellitus without complications: Secondary | ICD-10-CM

## 2021-04-20 LAB — POCT GLYCOSYLATED HEMOGLOBIN (HGB A1C): HbA1c, POC (controlled diabetic range): 8.1 % — AB (ref 0.0–7.0)

## 2021-04-20 MED ORDER — LISINOPRIL 10 MG PO TABS
10.0000 mg | ORAL_TABLET | Freq: Every day | ORAL | 3 refills | Status: DC
  Filled 2021-04-20: qty 30, 30d supply, fill #0
  Filled 2021-06-01: qty 30, 30d supply, fill #1
  Filled 2021-07-10: qty 30, 30d supply, fill #2
  Filled 2021-08-23: qty 30, 30d supply, fill #3
  Filled 2021-10-05: qty 30, 30d supply, fill #0
  Filled 2021-11-20: qty 30, 30d supply, fill #1
  Filled 2022-01-01: qty 30, 30d supply, fill #2

## 2021-04-20 MED ORDER — SITAGLIPTIN PHOSPHATE 100 MG PO TABS
100.0000 mg | ORAL_TABLET | Freq: Every day | ORAL | 1 refills | Status: DC
  Filled 2021-04-20: qty 30, 30d supply, fill #0
  Filled 2021-06-01: qty 30, 30d supply, fill #1
  Filled 2021-07-10: qty 30, 30d supply, fill #2

## 2021-04-20 MED ORDER — METFORMIN HCL 1000 MG PO TABS
1000.0000 mg | ORAL_TABLET | Freq: Two times a day (BID) | ORAL | 1 refills | Status: DC
  Filled 2021-04-20: qty 60, 30d supply, fill #0
  Filled 2021-06-01: qty 60, 30d supply, fill #1
  Filled 2021-07-10: qty 60, 30d supply, fill #2

## 2021-04-20 MED ORDER — GLIPIZIDE 10 MG PO TABS
10.0000 mg | ORAL_TABLET | Freq: Two times a day (BID) | ORAL | 1 refills | Status: DC
  Filled 2021-04-20: qty 60, 30d supply, fill #0

## 2021-04-20 NOTE — Progress Notes (Signed)
F/u DM  left hip pain x days Taking Tylenol

## 2021-04-20 NOTE — Patient Instructions (Signed)
Diabetes mellitus y nutricin, en adultos Diabetes Mellitus and Nutrition, Adult Si sufre de diabetes, o diabetes mellitus, es muy importante tener hbitos alimenticios saludables debido a que sus niveles de azcar en la sangre (glucosa) se ven afectados en gran medida por lo que come y bebe. Comer alimentos saludables en las cantidades correctas, aproximadamente a la misma hora todos los das, lo ayudar a: Controlar la glucemia. Disminuir el riesgo de sufrir una enfermedad cardaca. Mejorar la presin arterial. Alcanzar o mantener un peso saludable. Qu puede afectar mi plan de alimentacin? Todas las personas que sufren de diabetes son diferentes y cada una tiene necesidades diferentes en cuanto a un plan de alimentacin. El mdico puede recomendarle que trabaje con un nutricionista para elaborar el mejor plan para usted. Su plan de alimentacin puede variar segn factores como: Las caloras que necesita. Los medicamentos que toma. Su peso. Sus niveles de glucemia, presin arterial y colesterol. Su nivel de actividad. Otras afecciones que tenga, como enfermedades cardacas o renales. Cmo me afectan los carbohidratos? Los carbohidratos, o hidratos de carbono, afectan su nivel de glucemia ms que cualquier otro tipo de alimento. La ingesta de carbohidratos naturalmente aumenta la cantidad de glucosa en la sangre. El recuento de carbohidratos es un mtodo destinado a llevar un registro de la cantidad de carbohidratos que se consumen. El recuento de carbohidratos es importante para mantener la glucemia a un nivel saludable, especialmente si utiliza insulina o toma determinados medicamentos por va oral para la diabetes. Es importante conocer la cantidad de carbohidratos que se pueden ingerir en cada comida sin correr ningn riesgo. Esto es diferente en cada persona. Su nutricionista puede ayudarlo a calcular la cantidad de carbohidratos que debe ingerir en cada comida y en cada refrigerio. Cmo  me afecta el alcohol? El alcohol puede provocar disminuciones sbitas de la glucemia (hipoglucemia), especialmente si utiliza insulina o toma determinados medicamentos por va oral para la diabetes. La hipoglucemia es una afeccin potencialmente mortal. Los sntomas de la hipoglucemia, como somnolencia, mareos y confusin, son similares a los sntomas de haber consumido demasiado alcohol. No beba alcohol si: Su mdico le indica no hacerlo. Est embarazada, puede estar embarazada o est tratando de quedar embarazada. Si bebe alcohol: No beba con el estmago vaco. Limite la cantidad que bebe: De 0 a 1 medida por da para las mujeres. De 0 a 2 medidas por da para los hombres. Est atento a la cantidad de alcohol que hay en las bebidas que toma. En los [place], una medida equivale a una botella de cerveza de 12 oz (355 ml), un vaso de vino de 5 oz (148 ml) o un vaso de una bebida alcohlica de alta graduacin de 1 oz (44 ml). Mantngase hidratado bebiendo agua, refrescos dietticos o t helado sin azcar. Tenga en cuenta que los refrescos comunes, los jugos y otras bebida para mezclar pueden contener mucha azcar y se deben contar como carbohidratos. Consejos para seguir este plan Leer las etiquetas de los alimentos Comience por leer el tamao de la porcin en la "Informacin nutricional" en las etiquetas de los alimentos envasados y las bebidas. La cantidad de caloras, carbohidratos, grasas y otros nutrientes mencionados en la etiqueta se basan en una porcin del alimento. Muchos alimentos contienen ms de una porcin por envase. Verifique la cantidad total de gramos (g) de carbohidratos totales en una porcin. Puede calcular la cantidad de porciones de carbohidratos al dividir el total de carbohidratos por 15. Por ejemplo, si un alimento tiene un   total de 30 g de carbohidratos totales por porcin, equivale a 2 porciones de carbohidratos. Verifique la cantidad de gramos (g) de grasas  saturadas y grasas trans de una porcin. Escoja alimentos que no contengan estas grasas o que su contenido de estas sea bajo. Verifique la cantidad de miligramos (mg) de sal (sodio) en una porcin. La mayora de las personas deben limitar la ingesta de sodio total a menos de 2300 mg por da. Siempre consulte la informacin nutricional de los alimentos etiquetados como "con bajo contenido de grasa" o "sin grasa". Estos alimentos pueden tener un mayor contenido de azcar agregada o carbohidratos refinados, y deben evitarse. Hable con su nutricionista para identificar sus objetivos diarios en cuanto a los nutrientes mencionados en la etiqueta. Al ir de compras Evite comprar alimentos procesados, enlatados o precocidos. Estos alimentos tienden a tener una mayor cantidad de grasa, sodio y azcar agregada. Compre en la zona exterior de la tienda de comestibles. Esta es la zona donde se encuentran con mayor frecuencia las frutas y las verduras frescas, los cereales a granel, las carnes frescas y los productos lcteos frescos. Al cocinar Utilice mtodos de coccin a baja temperatura, como hornear, en lugar de mtodos de coccin a alta temperatura, como frer en abundante aceite. Cocine con aceites saludables, como el aceite de oliva, canola o girasol. Evite cocinar con manteca, crema o carnes con alto contenido de grasa. Planificacin de las comidas Coma las comidas y los refrigerios regularmente, preferentemente a la misma hora todos los das. Evite pasar largos perodos de tiempo sin comer. Consuma alimentos ricos en fibra, como frutas frescas, verduras, frijoles y cereales integrales. Consulte a su nutricionista sobre cuntas porciones de carbohidratos puede consumir en cada comida. Consuma entre 4 y 6 onzas (entre 112 y 168 g) de protenas magras por da, como carnes magras, pollo, pescado, huevos o tofu. Una onza (oz) de protena magra equivale a: 1 onza (28 g) de carne, pollo o pescado. 1 huevo.  de  taza (62 g) de tofu. Coma algunos alimentos por da que contengan grasas saludables, como aguacates, frutos secos, semillas y pescado. Qu alimentos debo comer? Frutas Bayas. Manzanas. Naranjas. Duraznos. Damascos. Ciruelas. Uvas. Mango. Papaya. Granada. Kiwi. Cerezas. Verduras Lechuga. Espinaca. Verduras de hoja verde, que incluyen col rizada, acelga, hojas de berza y de mostaza. Remolachas. Coliflor. Repollo. Brcoli. Zanahorias. Judas verdes. Tomates. Pimientos. Cebollas. Pepinos. Coles de Bruselas. Granos Granos integrales, como panes, galletas, tortillas, cereales y pastas de salvado o integrales. Avena sin azcar. Quinua. Arroz integral o salvaje. Carnes y otras protenas Mariscos. Carne de ave sin piel. Cortes magros de ave y carne de res. Tofu. Frutos secos. Semillas. Lcteos Productos lcteos sin grasa o con bajo contenido de grasa, como leche, yogur y queso. Es posible que los productos que se enumeran ms arriba no constituyan una lista completa de los alimentos y las bebidas que puede tomar. Consulte a un nutricionista para obtener ms informacin. Qu alimentos debo evitar? Frutas Frutas enlatadas al almbar. Verduras Verduras enlatadas. Verduras congeladas con mantequilla o salsa de crema. Granos Productos elaborados con harina y harina blanca refinada, como panes, pastas, bocadillos y cereales. Evite todos los alimentos procesados. Carnes y otras protenas Cortes de carne con alto contenido de grasa. Carne de ave con piel. Carnes empanizadas o fritas. Carne procesada. Evite las grasas saturadas. Lcteos Yogur, queso o leche enteros. Bebidas Bebidas azucaradas, como gaseosas o t helado. Es posible que los productos que se enumeran ms arriba no constituyan una lista completa de   los alimentos y las bebidas que debe evitar. Consulte a un nutricionista para obtener ms informacin. Preguntas para hacerle al mdico Es necesario que me rena con un instructor en el cuidado  de la diabetes? Es necesario que me rena con un nutricionista? A qu nmero puedo llamar si tengo preguntas? Cules son los mejores momentos para controlar la glucemia? Dnde encontrar ms informacin: Asociacin Estadounidense de la Diabetes (American Diabetes Association): diabetes.org Academy of Nutrition and Dietetics (Academia de Nutricin y Diettica): www.eatright.org National Institute of Diabetes and Digestive and Kidney Diseases (Instituto Nacional de la Diabetes y las Enfermedades Digestivas y Renales): www.niddk.nih.gov Association of Diabetes Care and Education Specialists (Asociacin de Especialistas en Atencin y Educacin sobre la Diabetes): www.diabeteseducator.org Resumen Es importante tener hbitos alimenticios saludables debido a que sus niveles de azcar en la sangre (glucosa) se ven afectados en gran medida por lo que come y bebe. Un plan de alimentacin saludable lo ayudar a controlar la glucemia y mantener un estilo de vida saludable. El mdico puede recomendarle que trabaje con un nutricionista para elaborar el mejor plan para usted. Tenga en cuenta que los carbohidratos (hidratos de carbono) y el alcohol tienen efectos inmediatos en sus niveles de glucemia. Es importante contar los carbohidratos que ingiere y consumir alcohol con prudencia. Esta informacin no tiene como fin reemplazar el consejo del mdico. Asegrese de hacerle al mdico cualquier pregunta que tenga. Document Revised: 10/15/2019 Document Reviewed: 10/15/2019 Elsevier Patient Education  2021 Elsevier Inc.  

## 2021-04-20 NOTE — Progress Notes (Signed)
Subjective:  Patient ID: [name] Perry, female    DOB: [birth year]  Age: 45 y.o. MRN: [id number]  CC: No chief complaint on file.  Interpretor-Rafael [id number]  HPI [name] Perry presents for follow-up of diabetes. Patient does check blood sugar at home  Compliant with meds - Yes Checking CBGs? Yes  Fasting avg - 60-230  Postprandial average -  Exercising regularly? - Yes Watching carbohydrate intake? - Yes Neuropathy ? - No Hypoglycemic events - No  - Recovers with :   Pertinent ROS:  Polyuria - No Polydipsia - No Vision problems - No  Medications as noted below. Taking them regularly without complication/adverse reaction being reported today.   History [name] Perry has a past medical history of Anxiety, Ectopic pregnancy (march), Gestational diabetes, Heart palpitations, and Hypertension (2010).   She has a past surgical history that includes Umbilical hernia repair (N/A, 01/11/2014); Insertion of mesh (N/A, 01/11/2014); and Hernia repair.   Her family history includes Alcohol abuse in her father; Cirrhosis in her mother; Ulcers in her mother.She reports that she has never smoked. She has never used smokeless tobacco. She reports that she does not drink alcohol and does not use drugs.  Current Outpatient Medications on File Prior to Visit  Medication Sig Dispense Refill   fenofibrate (TRICOR) 145 MG tablet TAKE 1 TABLET (145 MG TOTAL) BY MOUTH DAILY. 90 tablet 1   blood glucose meter kit and supplies KIT Dispense based on patient and insurance preference. Use up to four times daily as directed. (FOR ICD-9 250.00, 250.01). 1 each 0   fluconazole (DIFLUCAN) 150 MG tablet TAKE 1 TABLET BY MOUTH FOR 1 DOSE MAY REPEAT IN 72 HOURS 2 tablet 3   fluticasone (FLONASE) 50 MCG/ACT nasal spray Place 2 sprays into both nostrils daily. (Patient not taking: Reported on 04/20/2021) 16 g 6   glucose blood test strip USE AS DIRECTED 3 TIMES DAILY (Patient taking differently: USE AS  DIRECTED 3 TIMES DAILY) 100 strip 11   loratadine (CLARITIN) 10 MG tablet Take 1 tablet (10 mg total) by mouth daily. (Patient not taking: Reported on 04/20/2021) 30 tablet 11   omeprazole (PRILOSEC) 20 MG capsule Take 1 capsule (20 mg total) by mouth daily. (Patient not taking: No sig reported) 30 capsule 3   TRUEplus Lancets 28G MISC USE AS DIRECTED 3 TIMES DAILY 100 each 12   [DISCONTINUED] atorvastatin (LIPITOR) 40 MG tablet Take 1 tablet (40 mg total) by mouth daily. 30 tablet 3   No current facility-administered medications on file prior to visit.    ROS Review of Systems  All other systems reviewed and are negative.  Objective:  BP (!) 142/91 (BP Location: Left Arm, Patient Position: Sitting, Cuff Size: Normal)   Pulse 68   Temp (!) 97.5 F (36.4 C)   Resp 16   Wt 140 lb (63.5 kg)   SpO2 97%   BMI 23.30 kg/m   BP Readings from Last 3 Encounters:  04/20/21 (!) 142/91  12/26/20 108/75  11/07/20 115/76    Wt Readings from Last 3 Encounters:  04/20/21 140 lb (63.5 kg)  12/26/20 128 lb (58.1 kg)  11/07/20 113 lb (51.3 kg)    Physical Exam  General: Vital signs reviewed.  Patient is well-developed and well-nourished, normal weight female  in no acute distress and cooperative with exam.  Head: Normocephalic and atraumatic. Eyes: EOMI, conjunctivae normal, no scleral icterus.  Neck: Supple, trachea midline, normal ROM, no JVD, masses, thyromegaly, or carotid bruit present.  Cardiovascular:  RRR, S1 normal, S2 normal, no murmurs, gallops, or rubs. Pulmonary/Chest: Clear to auscultation bilaterally, no wheezes, rales, or rhonchi. Abdominal: Soft, non-tender, non-distended, BS +, no masses, organomegaly, or guarding present.  Musculoskeletal: No joint deformities, erythema, or stiffness, ROM full and nontender. Extremities: No lower extremity edema bilaterally,  pulses symmetric and intact bilaterally. No cyanosis or clubbing. Neurological: A&O x3, Strength is normal and  symmetric bilaterally,  Skin: Warm, dry and intact. No rashes or erythema. Psychiatric: Normal mood and affect. speech and behavior is normal. Cognition and memory are normal.    Lab Results  Component Value Date   HGBA1C 8.1 (A) 04/20/2021   HGBA1C >15 11/07/2020   HGBA1C 6.1 (A) 07/10/2019    Lab Results  Component Value Date   WBC 5.6 11/07/2020   HGB 13.9 11/07/2020   HCT 42.2 11/07/2020   PLT 261 11/07/2020   GLUCOSE 300 (H) 11/07/2020   CHOL 197 12/21/2020   TRIG 298 (H) 12/21/2020   HDL 44 12/21/2020   LDLCALC 102 (H) 12/21/2020   ALT 26 07/10/2019   AST 21 07/10/2019   NA 136 11/07/2020   K 4.0 11/07/2020   CL 95 (L) 11/07/2020   CREATININE 0.68 11/07/2020   BUN 12 11/07/2020   CO2 20 11/07/2020   TSH 1.460 11/07/2020   HGBA1C 8.1 (A) 04/20/2021     Assessment & Plan:  Diagnoses and all orders for this visit:  Type 2 diabetes mellitus without complication, without long-term current use of insulin (HCC) Goal of therapy: Less than 6.5 hemoglobin A1c.  Reference clinical practice recommendations. Foods that are high in carbohydrates are the following rice, potatoes, breads, sugars, and pastas.  Reduction in the intake (eating) will assist in lowering your blood sugars.  -     HgB A1c -     glipiZIDE (GLUCOTROL) 10 MG tablet; Take 1 tablet (10 mg total) by mouth 2 (two) times daily before a meal. -     metFORMIN (GLUCOPHAGE) 1000 MG tablet; Take 1 tablet (1,000 mg total) by mouth 2 (two) times daily with a meal. -     sitaGLIPtin (JANUVIA) 100 MG tablet; Take 1 tablet (100 mg total) by mouth daily. -     lisinopril (ZESTRIL) 10 MG tablet; Take 1 tablet (10 mg total) by mouth daily.  Essential hypertension Counseled on blood pressure goal of less than 130/80, low-sodium, DASH diet, medication compliance, 150 minutes of moderate intensity exercise per week. Discussed medication compliance, adverse effects.  -     lisinopril (ZESTRIL) 10 MG tablet; Take 1 tablet (10  mg total) by mouth daily.    I have discontinued [name] Perry's metFORMIN and metFORMIN. I have also changed her glipiZIDE. Additionally, I am having her start on metFORMIN, sitaGLIPtin, and lisinopril. Lastly, I am having her maintain her blood glucose meter kit and supplies, omeprazole, fenofibrate, TRUEplus Lancets 28G, glucose blood, fluconazole, loratadine, and fluticasone.  Meds ordered this encounter  Medications   glipiZIDE (GLUCOTROL) 10 MG tablet    Sig: Take 1 tablet (10 mg total) by mouth 2 (two) times daily before a meal.    Dispense:  180 tablet    Refill:  1   metFORMIN (GLUCOPHAGE) 1000 MG tablet    Sig: Take 1 tablet (1,000 mg total) by mouth 2 (two) times daily with a meal.    Dispense:  180 tablet    Refill:  1   sitaGLIPtin (JANUVIA) 100 MG tablet    Sig: Take 1 tablet (100 mg  total) by mouth daily.    Dispense:  90 tablet    Refill:  1   lisinopril (ZESTRIL) 10 MG tablet    Sig: Take 1 tablet (10 mg total) by mouth daily.    Dispense:  90 tablet    Refill:  3     Follow-up:   Return in about 4 weeks (around 05/18/2021) for Lurena Joiner (PCP 3 months).  The above assessment and management plan was discussed with the patient. The patient verbalized understanding of and has agreed to the management plan. Patient is aware to call the clinic if symptoms fail to improve or worsen. Patient is aware when to return to the clinic for a follow-up visit. Patient educated on when it is appropriate to go to the emergency department.   Juluis Mire, NP-C

## 2021-04-26 ENCOUNTER — Other Ambulatory Visit: Payer: Self-pay

## 2021-05-22 ENCOUNTER — Ambulatory Visit: Payer: No Typology Code available for payment source | Admitting: Pharmacist

## 2021-06-01 ENCOUNTER — Other Ambulatory Visit: Payer: Self-pay

## 2021-06-01 MED FILL — Glucose Blood Test Strip: 30 days supply | Qty: 100 | Fill #2 | Status: AC

## 2021-07-10 ENCOUNTER — Other Ambulatory Visit: Payer: Self-pay

## 2021-07-12 ENCOUNTER — Other Ambulatory Visit: Payer: Self-pay

## 2021-07-21 ENCOUNTER — Ambulatory Visit (INDEPENDENT_AMBULATORY_CARE_PROVIDER_SITE_OTHER): Payer: No Typology Code available for payment source | Admitting: Primary Care

## 2021-08-01 ENCOUNTER — Ambulatory Visit (INDEPENDENT_AMBULATORY_CARE_PROVIDER_SITE_OTHER): Payer: No Typology Code available for payment source | Admitting: Primary Care

## 2021-08-10 ENCOUNTER — Ambulatory Visit (INDEPENDENT_AMBULATORY_CARE_PROVIDER_SITE_OTHER): Payer: No Typology Code available for payment source | Admitting: Primary Care

## 2021-08-21 ENCOUNTER — Other Ambulatory Visit (INDEPENDENT_AMBULATORY_CARE_PROVIDER_SITE_OTHER): Payer: Self-pay | Admitting: Primary Care

## 2021-08-21 DIAGNOSIS — E119 Type 2 diabetes mellitus without complications: Secondary | ICD-10-CM

## 2021-08-21 NOTE — Telephone Encounter (Signed)
Pt is requesting refills on sitaGLIPtin (JANUVIA) 100 MG tablet  2)metFORMIN (GLUCOPHAGE) 1000 MG tablet  [facility]  [address], [place] [address]  Phone:  [phone]  Fax:  [phone]  DEA #:  [id number]

## 2021-08-22 ENCOUNTER — Other Ambulatory Visit: Payer: Self-pay

## 2021-08-22 MED ORDER — SITAGLIPTIN PHOSPHATE 100 MG PO TABS
100.0000 mg | ORAL_TABLET | Freq: Every day | ORAL | 1 refills | Status: DC
  Filled 2021-08-22: qty 30, 30d supply, fill #0

## 2021-08-22 MED ORDER — METFORMIN HCL 1000 MG PO TABS
1000.0000 mg | ORAL_TABLET | Freq: Two times a day (BID) | ORAL | 1 refills | Status: DC
  Filled 2021-08-22 – 2021-10-05 (×2): qty 60, 30d supply, fill #0
  Filled 2021-11-20: qty 60, 30d supply, fill #1
  Filled 2022-01-01: qty 60, 30d supply, fill #2

## 2021-08-22 NOTE — Telephone Encounter (Signed)
Requested medications are due for refill today should have refill left  Requested medications are on the active medication list yes  Last visit 04/20/21  Future visit scheduled 08/31/21  Notes to clinic record shows refill left, please assess. Requested Prescriptions  Pending Prescriptions Disp Refills   metFORMIN (GLUCOPHAGE) 1000 MG tablet 180 tablet 1    Sig: Take 1 tablet (1,000 mg total) by mouth 2 (two) times daily with a meal.     Endocrinology:  Diabetes - Biguanides Failed - 08/21/2021  8:27 PM      Failed - HBA1C is between 0 and 7.9 and within 180 days    HbA1c, POC (controlled diabetic range)  Date Value Ref Range Status  04/20/2021 8.1 (A) 0.0 - 7.0 % Final          Passed - Cr in normal range and within 360 days    Creat  Date Value Ref Range Status  07/27/2016 0.57 0.50 - 1.10 mg/dL Final   Creatinine, Ser  Date Value Ref Range Status  11/07/2020 0.68 0.57 - 1.00 mg/dL Final   Creatinine, Urine  Date Value Ref Range Status  11/20/2016 72.00 mg/dL Final          Passed - eGFR in normal range and within 360 days    GFR calc Af Amer  Date Value Ref Range Status  11/07/2020 124 >59 mL/min/1.73 Final    Comment:    **In accordance with recommendations from the NKF-ASN Task force,**   [facility] is in the process of updating its eGFR calculation to the   2021 CKD-EPI creatinine equation that estimates kidney function   without a race variable.    GFR calc non Af Amer  Date Value Ref Range Status  11/07/2020 107 >59 mL/min/1.73 Final          Passed - Valid encounter within last 6 months    Recent Outpatient Visits           4 months ago Type 2 diabetes mellitus without complication, without long-term current use of insulin ([place])   [place] [facility] Juluis Mire P, NP   7 months ago Type 2 diabetes mellitus without complication, without long-term current use of insulin ([place])   [place] Juluis Mire  P, NP   8 months ago Type 2 diabetes mellitus with hyperglycemia, without long-term current use of insulin [facility])   [place] Ausdall, Annie Main L, RPH-CPP   9 months ago Type 2 diabetes mellitus with hyperglycemia, without long-term current use of insulin [facility])   [place] Ausdall, Jarome Matin, RPH-CPP   9 months ago Vaginal itching   [place], Charlane Ferretti, MD       Future Appointments             In 1 week Kerin Perna, NP [facility]             sitaGLIPtin (JANUVIA) 100 MG tablet 90 tablet 1    Sig: Take 1 tablet (100 mg total) by mouth daily.     Endocrinology:  Diabetes - DPP-4 Inhibitors Failed - 08/21/2021  8:27 PM      Failed - HBA1C is between 0 and 7.9 and within 180 days    HbA1c, POC (controlled diabetic range)  Date Value Ref Range Status  04/20/2021 8.1 (A) 0.0 - 7.0 % Final  Passed - Cr in normal range and within 360 days    Creat  Date Value Ref Range Status  07/27/2016 0.57 0.50 - 1.10 mg/dL Final   Creatinine, Ser  Date Value Ref Range Status  11/07/2020 0.68 0.57 - 1.00 mg/dL Final   Creatinine, Urine  Date Value Ref Range Status  11/20/2016 72.00 mg/dL Final          Passed - Valid encounter within last 6 months    Recent Outpatient Visits           4 months ago Type 2 diabetes mellitus without complication, without long-term current use of insulin ([place])   [place] [facility] Juluis Mire P, NP   7 months ago Type 2 diabetes mellitus without complication, without long-term current use of insulin ([place])   [place] Juluis Mire P, NP   8 months ago Type 2 diabetes mellitus with hyperglycemia, without long-term current use of insulin [facility])   [place], Annie Main L, RPH-CPP   9 months ago Type 2 diabetes mellitus  with hyperglycemia, without long-term current use of insulin [facility])   [place], Jarome Matin, RPH-CPP   9 months ago Vaginal itching   [place], MD       Future Appointments             In 1 week Kerin Perna, NP [place]

## 2021-08-23 ENCOUNTER — Other Ambulatory Visit: Payer: Self-pay

## 2021-08-23 MED FILL — Glucose Blood Test Strip: 30 days supply | Qty: 100 | Fill #3 | Status: AC

## 2021-08-31 ENCOUNTER — Other Ambulatory Visit: Payer: Self-pay

## 2021-08-31 ENCOUNTER — Ambulatory Visit (INDEPENDENT_AMBULATORY_CARE_PROVIDER_SITE_OTHER): Payer: Self-pay | Admitting: Primary Care

## 2021-08-31 ENCOUNTER — Encounter (INDEPENDENT_AMBULATORY_CARE_PROVIDER_SITE_OTHER): Payer: Self-pay | Admitting: Primary Care

## 2021-08-31 VITALS — BP 108/74 | HR 78 | Temp 97.3°F | Ht 60.0 in | Wt 144.4 lb

## 2021-08-31 DIAGNOSIS — E119 Type 2 diabetes mellitus without complications: Secondary | ICD-10-CM

## 2021-08-31 DIAGNOSIS — E349 Endocrine disorder, unspecified: Secondary | ICD-10-CM

## 2021-08-31 DIAGNOSIS — Z23 Encounter for immunization: Secondary | ICD-10-CM

## 2021-08-31 DIAGNOSIS — G63 Polyneuropathy in diseases classified elsewhere: Secondary | ICD-10-CM

## 2021-08-31 LAB — POCT GLYCOSYLATED HEMOGLOBIN (HGB A1C): Hemoglobin A1C: 6.3 % — AB (ref 4.0–5.6)

## 2021-08-31 MED ORDER — SITAGLIPTIN PHOSPHATE 100 MG PO TABS
100.0000 mg | ORAL_TABLET | Freq: Every day | ORAL | 1 refills | Status: DC
  Filled 2021-08-31: qty 90, 90d supply, fill #0
  Filled 2021-10-05: qty 30, 30d supply, fill #0
  Filled 2021-11-20: qty 30, 30d supply, fill #1
  Filled 2022-01-01: qty 20, 20d supply, fill #2

## 2021-08-31 MED ORDER — GABAPENTIN 100 MG PO CAPS
100.0000 mg | ORAL_CAPSULE | Freq: Three times a day (TID) | ORAL | 1 refills | Status: DC
  Filled 2021-08-31: qty 90, 30d supply, fill #0

## 2021-08-31 NOTE — Progress Notes (Signed)
Sharp pains in feet and hands, along with numbness

## 2021-08-31 NOTE — Progress Notes (Signed)
Subjective:  Patient ID: [name] Perry, female    DOB: 1976/04/20  Age: 45 y.o. MRN: [id number]  CC: Diabetes   HPI [name] Perry Hispanic female ( interpreter Myrlene Broker [phone]) presents for follow-up of diabetes. Patient does not check blood sugar at home  Compliant with meds - Yes Checking CBGs? Yes  Fasting avg - 90-120  Postprandial average -  Exercising regularly? - Yes Watching carbohydrate intake? - Yes Neuropathy ? - Yes Hypoglycemic events - Yes  - Recovers with :   Pertinent ROS:  Polyuria - No Polydipsia - No Vision problems - No  Medications as noted below. Taking them regularly without complication/adverse reaction being reported today.   History [name] Perry has a past medical history of Anxiety, Ectopic pregnancy (march), Gestational diabetes, Heart palpitations, and Hypertension (2010).   [name] Perry has a past surgical history that includes Umbilical hernia repair (N/A, 01/11/2014); Insertion of mesh (N/A, 01/11/2014); and Hernia repair.   Her family history includes Alcohol abuse in her father; Cirrhosis in her mother; Ulcers in her mother.[name] Perry reports that [name] Perry has never smoked. [name] Perry has never used smokeless tobacco. [name] Perry reports that [name] Perry does not drink alcohol and does not use drugs.  Current Outpatient Medications on File Prior to Visit  Medication Sig Dispense Refill   blood glucose meter kit and supplies KIT Dispense based on patient and insurance preference. Use up to four times daily as directed. (FOR ICD-9 250.00, 250.01). 1 each 0   fenofibrate (TRICOR) 145 MG tablet TAKE 1 TABLET (145 MG TOTAL) BY MOUTH DAILY. 90 tablet 1   fluconazole (DIFLUCAN) 150 MG tablet TAKE 1 TABLET BY MOUTH FOR 1 DOSE MAY REPEAT IN 72 HOURS 2 tablet 3   fluticasone (FLONASE) 50 MCG/ACT nasal spray Place 2 sprays into both nostrils daily. 16 g 6   glucose blood test strip USE AS DIRECTED 3 TIMES DAILY (Patient taking differently: USE AS DIRECTED 3 TIMES DAILY) 100 strip 11    lisinopril (ZESTRIL) 10 MG tablet Take 1 tablet (10 mg total) by mouth daily. 90 tablet 3   loratadine (CLARITIN) 10 MG tablet Take 1 tablet (10 mg total) by mouth daily. 30 tablet 11   metFORMIN (GLUCOPHAGE) 1000 MG tablet Take 1 tablet (1,000 mg total) by mouth 2 (two) times daily with a meal. 180 tablet 1   omeprazole (PRILOSEC) 20 MG capsule Take 1 capsule (20 mg total) by mouth daily. 30 capsule 3   TRUEplus Lancets 28G MISC USE AS DIRECTED 3 TIMES DAILY 100 each 12   [DISCONTINUED] atorvastatin (LIPITOR) 40 MG tablet Take 1 tablet (40 mg total) by mouth daily. 30 tablet 3   No current facility-administered medications on file prior to visit.    ROS Review of Systems  Neurological:  Positive for numbness.       Tingling /numbness   All other systems reviewed and are negative.  Objective:  BP 108/74 (BP Location: Right Arm, Patient Position: Sitting, Cuff Size: Small)   Pulse 78   Temp (!) 97.3 F (36.3 C) (Temporal)   Ht 5' (1.524 m)   Wt 144 lb 6.4 oz (65.5 kg)   LMP 08/16/2021 (Approximate)   SpO2 98%   BMI 28.20 kg/m   BP Readings from Last 3 Encounters:  08/31/21 108/74  04/20/21 (!) 142/91  12/26/20 108/75    Wt Readings from Last 3 Encounters:  08/31/21 144 lb 6.4 oz (65.5 kg)  04/20/21 140 lb (63.5 kg)  12/26/20 128 lb (58.1 kg)    Physical  Exam Physical exam: General: Vital signs reviewed.  Patient is well-developed and well-nourished, over weight female in no acute distress and cooperative with exam. Head: Normocephalic and atraumatic. Eyes: EOMI, conjunctivae normal, no scleral icterus. Neck: Supple, trachea midline, normal ROM, no JVD, masses, thyromegaly, or carotid bruit present. Cardiovascular: RRR, S1 normal, S2 normal, no murmurs, gallops, or rubs. Pulmonary/Chest: Clear to auscultation bilaterally, no wheezes, rales, or rhonchi. Abdominal: Soft, non-tender, non-distended, BS +, no masses, organomegaly, or guarding present. Musculoskeletal: No  joint deformities, erythema, or stiffness, ROM full and nontender. Extremities: No lower extremity edema bilaterally,  pulses symmetric and intact bilaterally. No cyanosis or clubbing. Neurological: A&O x3, Strength is normal Skin: Warm, dry and intact. No rashes or erythema. Psychiatric: Normal mood and affect. speech and behavior is normal. Cognition and memory are normal.    Lab Results  Component Value Date   HGBA1C 6.3 (A) 08/31/2021   HGBA1C 8.1 (A) 04/20/2021   HGBA1C >15 11/07/2020    Lab Results  Component Value Date   WBC 5.6 11/07/2020   HGB 13.9 11/07/2020   HCT 42.2 11/07/2020   PLT 261 11/07/2020   GLUCOSE 300 (H) 11/07/2020   CHOL 197 12/21/2020   TRIG 298 (H) 12/21/2020   HDL 44 12/21/2020   LDLCALC 102 (H) 12/21/2020   ALT 26 07/10/2019   AST 21 07/10/2019   NA 136 11/07/2020   K 4.0 11/07/2020   CL 95 (L) 11/07/2020   CREATININE 0.68 11/07/2020   BUN 12 11/07/2020   CO2 20 11/07/2020   TSH 1.460 11/07/2020   HGBA1C 6.3 (A) 08/31/2021     Assessment & Plan:   [name] Perry was seen today for diabetes.  Diagnoses and all orders for this visit:  Type 2 diabetes mellitus without complication, without long-term current use of insulin (HCC) -     HgB A1c -     Lipid Panel -     CMP14+EGFR -     CBC with Differential -     sitaGLIPtin (JANUVIA) 100 MG tablet; Take 1 tablet (100 mg total) by mouth daily.  Need for immunization against influenza -     Flu Vaccine QUAD 60mo+IM (Fluarix, Fluzone & Alfiuria Quad PF)  Neuropathy associated with endocrine disorder (HCC) -     gabapentin (NEURONTIN) 100 MG capsule; Take 1 capsule (100 mg total) by mouth 3 (three) times daily.   I have discontinued [name] Perry's glipiZIDE. I am also having her start on gabapentin. Additionally, I am having her maintain her blood glucose meter kit and supplies, omeprazole, fenofibrate, TRUEplus Lancets 28G, glucose blood, fluconazole, loratadine, fluticasone, lisinopril,  metFORMIN, and sitaGLIPtin.  Meds ordered this encounter  Medications   gabapentin (NEURONTIN) 100 MG capsule    Sig: Take 1 capsule (100 mg total) by mouth 3 (three) times daily.    Dispense:  90 capsule    Refill:  1   sitaGLIPtin (JANUVIA) 100 MG tablet    Sig: Take 1 tablet (100 mg total) by mouth daily.    Dispense:  90 tablet    Refill:  1     Follow-up:   Return in about 4 weeks (around 09/28/2021) for Luke/pcp 3 months.  The above assessment and management plan was discussed with the patient. The patient verbalized understanding of and has agreed to the management plan. Patient is aware to call the clinic if symptoms fail to improve or worsen. Patient is aware when to return to the clinic for a follow-up visit. Patient educated  on when it is appropriate to go to the emergency department.   Juluis Mire, NP-C

## 2021-08-31 NOTE — Patient Instructions (Signed)
Gripe en los adultos Influenza, Adult A la gripe tambin se la conoce como "influenza". Es una infeccin en los pulmones, la nariz y la garganta (vas respiratorias). Se transmite fcilmente de persona a persona (es contagiosa). La gripe causa sntomas que son como los de un resfro, junto con fiebre alta y dolores corporales. Cules son las causas? La causa de esta afeccin es el virus de la influenza. Puede contraer el virus de las siguientes maneras: Al inhalar gotitas que quedan en el aire despus de que una persona infectada con gripe tosi o estornud. Al tocar algo que est contaminado con el virus y luego llevarse la mano a la boca, la nariz o los ojos. Qu incrementa el riesgo? Hay ciertas cosas que lo pueden hacer ms propenso a tener gripe. Estas incluyen lo siguiente: No lavarse las manos con frecuencia. Tener contacto cercano con muchas personas durante la temporada de resfro y gripe. Tocarse la boca, los ojos o la nariz sin antes lavarse las manos. No recibir la vacuna antigripal todos los aos. Puede correr un mayor riesgo de tener gripe, y problemas graves, como una infeccin pulmonar (neumona), si usted: Es mayor de 65 aos de edad. Est embarazada. Tiene debilitado el sistema que combate las defensas (sistema inmunitario) debido a una enfermedad o a que toma determinados medicamentos. Tiene una afeccin a largo plazo (crnica), como las siguientes: Enfermedad cardaca, renal o pulmonar. Diabetes. Asma. Tiene un trastorno heptico. Tiene mucho sobrepeso (obesidad mrbida). Tiene anemia. Cules son los signos o sntomas? Los sntomas normalmente comienzan de repente y duran entre 4 y 14 das. Pueden incluir los siguientes: Fiebre y escalofros. Dolores de cabeza, dolores en el cuerpo o dolores musculares. Dolor de garganta. Tos. Secrecin o congestin nasal. Molestias en el pecho. No querer comer tanto como lo hace normalmente. Sensacin de debilidad o  cansancio. Mareos. Malestar estomacal o vmitos. Cmo se trata? Si la gripe se detecta de forma temprana, puede recibir tratamiento con medicamentos antivirales. Esto puede ayudar a reducir la gravedad y la duracin de la enfermedad. Se los administrarn por boca o a travs de un tubo (catter) intravenoso. Cuidarse en su hogar puede ayudar a que mejoren los sntomas. El mdico puede recomendarle lo siguiente: Tomar medicamentos de venta libre. Beber abundante lquido. La gripe suele desaparecer sola. Si tiene sntomas muy graves u otros problemas, puede recibir tratamiento en un hospital. Siga estas instrucciones en su casa:   Actividad Descanse todo lo que sea necesario. Duerma lo suficiente. Qudese en su casa y no concurra al trabajo o a la escuela, como se lo haya indicado el mdico. No salga de su casa hasta que no haya tenido fiebre por 24 horas sin tomar medicamentos. Salga de su casa solamente para ir al mdico. Comida y bebida Tome una SRO (solucin de rehidratacin oral). Es una bebida que se vende en farmacias y tiendas. Beba suficiente lquido como para mantener la orina de color amarillo plido. En la medida en que pueda, beba lquidos transparentes en pequeas cantidades. Los lquidos transparentes son, por ejemplo: Agua. Trocitos de hielo. Jugo de frutas mezclado con agua. Bebidas deportivas de bajas caloras. Coma alimentos suaves que sean fciles de digerir. En la medida que pueda, consuma pequeas cantidades. Estos alimentos incluyen: Bananas. Pur de manzana. Arroz. Carnes magras. Tostadas. Galletas. No coma ni beba lo siguiente: Lquidos con alto contenido de azcar o cafena. Alcohol. Alimentos condimentados o con alto contenido de grasa. Indicaciones generales Use los medicamentos de venta libre y los recetados solamente   como se lo haya indicado el mdico. Use un humidificador de aire fro para que el aire de su casa est ms hmedo. Esto puede facilitar  la respiracin. Cuando utilice un humidificador de vapor fro, lmpielo a diario. Vace el agua y cmbiela por agua limpia. Al toser o estornudar, cbrase la boca y la nariz. Lvese las manos frecuentemente con agua y jabn y durante al menos 20 segundos. Esto tambin es importante despus de toser o estornudar. Si no dispone de agua y jabn, use desinfectante para manos con alcohol. Cumpla con todas las visitas de seguimiento. Cmo se previene?  Colquese la vacuna antigripal todos los aos. Puede colocarse la vacuna contra la gripe a fines de verano, en otoo o en invierno. Pregntele al mdico cundo debe aplicarse la vacuna contra la gripe. Evite el contacto con personas que estn enfermas durante el otoo y el invierno. Es la temporada del resfro y la gripe. Comunquese con un mdico si: Tiene sntomas nuevos. Tiene los siguientes sntomas: Dolor de pecho. Materia fecal lquida (diarrea). Fiebre. La tos empeora. Empieza a tener ms mucosidad. Tiene malestar estomacal. Vomita. Solicite ayuda de inmediato si: Le falta el aire. Tiene dificultad para respirar. La piel o las uas se ponen de un color azulado. Presenta dolor muy intenso o rigidez en el cuello. Tiene dolor de cabeza repentino. Le duele la cara o el odo de forma repentina. No puede comer ni beber sin vomitar. Estos sntomas pueden representar un problema grave que constituye una emergencia. Solicite atencin mdica de inmediato. Comunquese con el servicio de emergencias de su localidad (911 en los [place]). No espere a ver si los sntomas desaparecen. No conduzca por sus propios medios hasta el hospital. Resumen A la gripe tambin se la conoce como "influenza". Es una infeccin en los pulmones, la nariz y la garganta. Se transmite fcilmente de una persona a otra. Use los medicamentos de venta libre y los recetados solamente como se lo haya indicado el mdico. Aplicarse la vacuna contra la gripe todos los  aos es la mejor manera de no contagiarse la gripe. Esta informacin no tiene como fin reemplazar el consejo del mdico. Asegrese de hacerle al mdico cualquier pregunta que tenga. Document Revised: 07/07/2020 Document Reviewed: 07/07/2020 Elsevier Patient Education  2022 Elsevier Inc.  

## 2021-09-01 ENCOUNTER — Other Ambulatory Visit (INDEPENDENT_AMBULATORY_CARE_PROVIDER_SITE_OTHER): Payer: Self-pay | Admitting: Primary Care

## 2021-09-01 ENCOUNTER — Other Ambulatory Visit: Payer: Self-pay

## 2021-09-01 ENCOUNTER — Telehealth (INDEPENDENT_AMBULATORY_CARE_PROVIDER_SITE_OTHER): Payer: Self-pay

## 2021-09-01 DIAGNOSIS — E7841 Elevated Lipoprotein(a): Secondary | ICD-10-CM

## 2021-09-01 LAB — CBC WITH DIFFERENTIAL/PLATELET
Basophils Absolute: 0.1 10*3/uL (ref 0.0–0.2)
Basos: 1 %
EOS (ABSOLUTE): 0.2 10*3/uL (ref 0.0–0.4)
Eos: 2 %
Hematocrit: 38.6 % (ref 34.0–46.6)
Hemoglobin: 12.7 g/dL (ref 11.1–15.9)
Immature Grans (Abs): 0 10*3/uL (ref 0.0–0.1)
Immature Granulocytes: 0 %
Lymphocytes Absolute: 2.8 10*3/uL (ref 0.7–3.1)
Lymphs: 36 %
MCH: 29.5 pg (ref 26.6–33.0)
MCHC: 32.9 g/dL (ref 31.5–35.7)
MCV: 90 fL (ref 79–97)
Monocytes Absolute: 0.4 10*3/uL (ref 0.1–0.9)
Monocytes: 5 %
Neutrophils Absolute: 4.4 10*3/uL (ref 1.4–7.0)
Neutrophils: 56 %
Platelets: 284 10*3/uL (ref 150–450)
RBC: 4.31 x10E6/uL (ref 3.77–5.28)
RDW: 13 % (ref 11.7–15.4)
WBC: 7.9 10*3/uL (ref 3.4–10.8)

## 2021-09-01 LAB — CMP14+EGFR
ALT: 20 IU/L (ref 0–32)
AST: 16 IU/L (ref 0–40)
Albumin/Globulin Ratio: 1.4 (ref 1.2–2.2)
Albumin: 4.3 g/dL (ref 3.8–4.8)
Alkaline Phosphatase: 64 IU/L (ref 44–121)
BUN/Creatinine Ratio: 18 (ref 9–23)
BUN: 12 mg/dL (ref 6–24)
Bilirubin Total: 0.5 mg/dL (ref 0.0–1.2)
CO2: 22 mmol/L (ref 20–29)
Calcium: 9.2 mg/dL (ref 8.7–10.2)
Chloride: 101 mmol/L (ref 96–106)
Creatinine, Ser: 0.67 mg/dL (ref 0.57–1.00)
Globulin, Total: 3.1 g/dL (ref 1.5–4.5)
Glucose: 90 mg/dL (ref 70–99)
Potassium: 4 mmol/L (ref 3.5–5.2)
Sodium: 137 mmol/L (ref 134–144)
Total Protein: 7.4 g/dL (ref 6.0–8.5)
eGFR: 110 mL/min/{1.73_m2} (ref >59)

## 2021-09-01 LAB — LIPID PANEL
Chol/HDL Ratio: 4.5 ratio — ABNORMAL HIGH (ref 0.0–4.4)
Cholesterol, Total: 158 mg/dL (ref 100–199)
HDL: 35 mg/dL — ABNORMAL LOW (ref >39)
LDL Chol Calc (NIH): 79 mg/dL (ref 0–99)
Triglycerides: 266 mg/dL — ABNORMAL HIGH (ref 0–149)
VLDL Cholesterol Cal: 44 mg/dL — ABNORMAL HIGH (ref 5–40)

## 2021-09-01 MED ORDER — FENOFIBRATE 145 MG PO TABS
ORAL_TABLET | Freq: Every day | ORAL | 1 refills | Status: DC
  Filled 2021-09-01: qty 30, 30d supply, fill #0

## 2021-09-01 NOTE — Telephone Encounter (Signed)
Call placed to patient with assistance of pacific interpreter [phone]) patient is aware of results and medication being refilled. She verbalized understanding. Maryjean Morn, CMA

## 2021-09-01 NOTE — Telephone Encounter (Signed)
-----   Message from Grayce Sessions, NP sent at 09/01/2021 12:11 PM EST ----- Labs normal except elevated triglycerides. Refilled Fenofibrate.  Healthy lifestyle diet of fruits vegetables fish nuts whole grains and low saturated fat . Foods high in cholesterol or liver, fatty meats,cheese, butter avocados, nuts and seeds, chocolate and fried foods.

## 2021-09-07 ENCOUNTER — Other Ambulatory Visit: Payer: Self-pay

## 2021-09-08 ENCOUNTER — Other Ambulatory Visit: Payer: Self-pay

## 2021-09-28 ENCOUNTER — Ambulatory Visit: Payer: No Typology Code available for payment source | Admitting: Pharmacist

## 2021-10-03 ENCOUNTER — Telehealth: Payer: Self-pay | Admitting: *Deleted

## 2021-10-03 NOTE — Telephone Encounter (Signed)
Copied from [place] [id number]. Topic: Appointment Scheduling - Scheduling Inquiry for Clinic >> Sep 29, 2021  9:19 AM McGill, Nelva Bush wrote: Reason for CRM: Pt is requesting to reschedule appointment she missed with Pharmacist Gerhard Perches On 09/28/2021.

## 2021-10-03 NOTE — Telephone Encounter (Signed)
Copied from [name] [id number]. Topic: Appointment Scheduling - Scheduling Inquiry for Clinic >> Sep 29, 2021  9:29 AM McGill, Nelva Bush wrote: Reason for CRM: Pt requesting call back from [place]. Pt stated her orange card expired and she would like to renew it.

## 2021-10-05 ENCOUNTER — Other Ambulatory Visit: Payer: Self-pay

## 2021-10-09 ENCOUNTER — Other Ambulatory Visit: Payer: Self-pay

## 2021-10-11 ENCOUNTER — Other Ambulatory Visit: Payer: Self-pay

## 2021-10-11 ENCOUNTER — Ambulatory Visit: Payer: Self-pay | Attending: Primary Care

## 2021-10-24 ENCOUNTER — Ambulatory Visit: Payer: No Typology Code available for payment source | Admitting: Pharmacist

## 2021-11-02 ENCOUNTER — Ambulatory Visit: Payer: No Typology Code available for payment source | Admitting: Pharmacist

## 2021-11-20 ENCOUNTER — Other Ambulatory Visit: Payer: Self-pay | Admitting: Primary Care

## 2021-11-20 ENCOUNTER — Other Ambulatory Visit: Payer: Self-pay

## 2021-11-20 DIAGNOSIS — E1165 Type 2 diabetes mellitus with hyperglycemia: Secondary | ICD-10-CM

## 2021-11-20 MED ORDER — TRUEPLUS LANCETS 28G MISC
12 refills | Status: AC
  Filled 2021-11-20: qty 100, 33d supply, fill #0
  Filled 2022-10-15: qty 100, 33d supply, fill #1

## 2021-11-21 ENCOUNTER — Other Ambulatory Visit: Payer: Self-pay

## 2021-11-29 ENCOUNTER — Ambulatory Visit (INDEPENDENT_AMBULATORY_CARE_PROVIDER_SITE_OTHER): Payer: No Typology Code available for payment source | Admitting: Primary Care

## 2021-12-19 ENCOUNTER — Ambulatory Visit (INDEPENDENT_AMBULATORY_CARE_PROVIDER_SITE_OTHER): Payer: No Typology Code available for payment source | Admitting: Primary Care

## 2021-12-21 ENCOUNTER — Ambulatory Visit (INDEPENDENT_AMBULATORY_CARE_PROVIDER_SITE_OTHER): Payer: No Typology Code available for payment source | Admitting: Primary Care

## 2022-01-01 ENCOUNTER — Other Ambulatory Visit: Payer: Self-pay | Admitting: Primary Care

## 2022-01-01 ENCOUNTER — Other Ambulatory Visit: Payer: Self-pay

## 2022-01-01 DIAGNOSIS — E1165 Type 2 diabetes mellitus with hyperglycemia: Secondary | ICD-10-CM

## 2022-01-01 MED ORDER — TRUE METRIX BLOOD GLUCOSE TEST VI STRP
ORAL_STRIP | 11 refills | Status: DC
  Filled 2022-01-01: qty 100, 25d supply, fill #0

## 2022-01-05 ENCOUNTER — Other Ambulatory Visit: Payer: Self-pay

## 2022-01-25 ENCOUNTER — Encounter (INDEPENDENT_AMBULATORY_CARE_PROVIDER_SITE_OTHER): Payer: Self-pay | Admitting: Primary Care

## 2022-01-25 ENCOUNTER — Ambulatory Visit (INDEPENDENT_AMBULATORY_CARE_PROVIDER_SITE_OTHER): Payer: Self-pay | Admitting: Primary Care

## 2022-01-25 ENCOUNTER — Other Ambulatory Visit: Payer: Self-pay

## 2022-01-25 VITALS — BP 113/79 | HR 84 | Temp 98.0°F | Ht 65.0 in | Wt 141.2 lb

## 2022-01-25 DIAGNOSIS — E7841 Elevated Lipoprotein(a): Secondary | ICD-10-CM

## 2022-01-25 DIAGNOSIS — J301 Allergic rhinitis due to pollen: Secondary | ICD-10-CM

## 2022-01-25 DIAGNOSIS — I10 Essential (primary) hypertension: Secondary | ICD-10-CM

## 2022-01-25 DIAGNOSIS — E119 Type 2 diabetes mellitus without complications: Secondary | ICD-10-CM

## 2022-01-25 DIAGNOSIS — E1165 Type 2 diabetes mellitus with hyperglycemia: Secondary | ICD-10-CM

## 2022-01-25 LAB — POCT GLYCOSYLATED HEMOGLOBIN (HGB A1C): Hemoglobin A1C: 6.3 % — AB (ref 4.0–5.6)

## 2022-01-25 MED ORDER — LISINOPRIL 10 MG PO TABS
10.0000 mg | ORAL_TABLET | Freq: Every day | ORAL | 3 refills | Status: DC
Start: 2022-01-25 — End: 2022-10-04
  Filled 2022-01-25 – 2022-02-09 (×2): qty 30, 30d supply, fill #0
  Filled 2022-03-19: qty 30, 30d supply, fill #1
  Filled 2022-04-11: qty 30, 30d supply, fill #2
  Filled 2022-05-22: qty 30, 30d supply, fill #3
  Filled 2022-07-09: qty 90, 90d supply, fill #4

## 2022-01-25 MED ORDER — LORATADINE 10 MG PO TABS
10.0000 mg | ORAL_TABLET | Freq: Every day | ORAL | 11 refills | Status: DC
  Filled 2022-01-25: qty 30, 30d supply, fill #0

## 2022-01-25 MED ORDER — FLUTICASONE PROPIONATE 50 MCG/ACT NA SUSP
2.0000 | Freq: Every day | NASAL | 6 refills | Status: AC
  Filled 2022-01-25: qty 16, 30d supply, fill #0

## 2022-01-25 MED ORDER — TRUE METRIX BLOOD GLUCOSE TEST VI STRP
ORAL_STRIP | 11 refills | Status: DC
  Filled 2022-01-25: qty 100, 30d supply, fill #0
  Filled 2022-04-02: qty 100, 25d supply, fill #0
  Filled 2022-05-04: qty 100, 25d supply, fill #1
  Filled 2022-08-01: qty 100, 25d supply, fill #2

## 2022-01-25 MED ORDER — FENOFIBRATE 145 MG PO TABS
ORAL_TABLET | Freq: Every day | ORAL | 1 refills | Status: DC
  Filled 2022-01-25: qty 90, 90d supply, fill #0

## 2022-01-25 MED ORDER — METFORMIN HCL ER 500 MG PO TB24
500.0000 mg | ORAL_TABLET | Freq: Every day | ORAL | 1 refills | Status: DC
Start: 2022-01-25 — End: 2022-05-15
  Filled 2022-01-25 – 2022-02-09 (×2): qty 30, 30d supply, fill #0
  Filled 2022-03-19: qty 30, 30d supply, fill #1
  Filled 2022-04-11: qty 30, 30d supply, fill #2
  Filled 2022-05-04: qty 30, 30d supply, fill #3

## 2022-01-25 MED ORDER — SITAGLIPTIN PHOSPHATE 100 MG PO TABS
100.0000 mg | ORAL_TABLET | Freq: Every day | ORAL | 1 refills | Status: DC
  Filled 2022-01-25: qty 90, 90d supply, fill #0
  Filled 2022-04-11: qty 14, 14d supply, fill #0
  Filled 2022-04-18 – 2022-05-04 (×2): qty 90, 90d supply, fill #1

## 2022-01-25 NOTE — Progress Notes (Signed)
? ?Subjective:  ?Patient ID: [name] Perry, female    DOB: 1976/04/23  Age: 46 y.o. MRN: [id number] ? ?CC: Diabetes ? ? ?HPI ?Ms.Tenesha Garza Hispanic female (interpreter Darrick Meigs [phone]) presents for follow-up of diabetes. Patient does check blood sugar at home. Management of HTN- Patient has No headache, No chest pain, No abdominal pain - No Nausea, No new weakness tingling or numbness, No Cough - shortness of breath ? ? ?Compliant with meds - Yes ?Checking CBGs? Yes ? Fasting avg - 100-140  ? Postprandial average -  ?Exercising regularly? - Yes ?Watching carbohydrate intake? - Yes ?Neuropathy ? - Yes ?Hypoglycemic events - No ? - Recovers with :  ? ?Pertinent ROS:  ?Polyuria - No ?Polydipsia - No ?Vision problems - No ? ?Medications as noted below. Taking them regularly without complication/adverse reaction being reported today.  ? ?History ?Brad has a past medical history of Anxiety, Ectopic pregnancy (march), Gestational diabetes, Heart palpitations, and Hypertension (2010).  ? ?She has a past surgical history that includes Umbilical hernia repair (N/A, 01/11/2014); Insertion of mesh (N/A, 01/11/2014); and Hernia repair.  ? ?Her family history includes Alcohol abuse in her father; Cirrhosis in her mother; Ulcers in her mother.She reports that she has never smoked. She has never used smokeless tobacco. She reports that she does not drink alcohol and does not use drugs. ? ?Current Outpatient Medications on File Prior to Visit  ?Medication Sig Dispense Refill  ? blood glucose meter kit and supplies KIT Dispense based on patient and insurance preference. Use up to four times daily as directed. (FOR ICD-9 250.00, 250.01). 1 each 0  ? fenofibrate (TRICOR) 145 MG tablet TAKE 1 TABLET (145 MG TOTAL) BY MOUTH DAILY. 90 tablet 1  ? fluticasone (FLONASE) 50 MCG/ACT nasal spray Place 2 sprays into both nostrils daily. 16 g 6  ? gabapentin (NEURONTIN) 100 MG capsule Take 1 capsule (100 mg total) by mouth 3  (three) times daily. 90 capsule 1  ? glucose blood (TRUE METRIX BLOOD GLUCOSE TEST) test strip USE AS DIRECTED 3 TIMES DAILY 100 strip 11  ? lisinopril (ZESTRIL) 10 MG tablet Take 1 tablet (10 mg total) by mouth daily. 90 tablet 3  ? loratadine (CLARITIN) 10 MG tablet Take 1 tablet (10 mg total) by mouth daily. 30 tablet 11  ? omeprazole (PRILOSEC) 20 MG capsule Take 1 capsule (20 mg total) by mouth daily. 30 capsule 3  ? sitaGLIPtin (JANUVIA) 100 MG tablet Take 1 tablet (100 mg total) by mouth daily. 90 tablet 1  ? TRUEplus Lancets 28G MISC USE AS DIRECTED 3 TIMES DAILY 100 each 12  ? [DISCONTINUED] atorvastatin (LIPITOR) 40 MG tablet Take 1 tablet (40 mg total) by mouth daily. 30 tablet 3  ? ?No current facility-administered medications on file prior to visit.  ? ? ?ROS ?Comprehensive ROS Pertinent positive and negative noted in HPI   ? ?Objective:  ?BP 113/79 (BP Location: Right Arm, Patient Position: Sitting, Cuff Size: Normal)   Pulse 84   Temp 98 ?F (36.7 ?C) (Oral)   Ht $R'5\' 5"'TW$  (1.651 m)   Wt 141 lb 3.2 oz (64 kg)   SpO2 98%   BMI 23.50 kg/m?  ? ?BP Readings from Last 3 Encounters:  ?01/25/22 113/79  ?08/31/21 108/74  ?04/20/21 (!) 142/91  ? ? ?Wt Readings from Last 3 Encounters:  ?01/25/22 141 lb 3.2 oz (64 kg)  ?08/31/21 144 lb 6.4 oz (65.5 kg)  ?04/20/21 140 lb (63.5 kg)  ? ? ?Physical Exam ?  Vitals reviewed.  ?Constitutional:   ?   Appearance: Normal appearance.  ?HENT:  ?   Head: Normocephalic.  ?   Right Ear: External ear normal. There is impacted cerumen.  ?   Left Ear: Tympanic membrane normal.  ?   Ears:  ?   Comments: Curette used to remove cerumen  ?   Nose: Nose normal.  ?Eyes:  ?   Extraocular Movements: Extraocular movements intact.  ?   Pupils: Pupils are equal, round, and reactive to light.  ?Cardiovascular:  ?   Rate and Rhythm: Normal rate and regular rhythm.  ?Pulmonary:  ?   Effort: Pulmonary effort is normal.  ?   Breath sounds: Normal breath sounds.  ?Abdominal:  ?   General: Abdomen  is flat. Bowel sounds are normal.  ?   Palpations: Abdomen is soft.  ?Musculoskeletal:     ?   General: Normal range of motion.  ?   Cervical back: Normal range of motion.  ?Skin: ?   General: Skin is warm and dry.  ?Neurological:  ?   Mental Status: She is alert and oriented to person, place, and time.  ?Psychiatric:     ?   Mood and Affect: Mood normal.     ?   Behavior: Behavior normal.     ?   Thought Content: Thought content normal.     ?   Judgment: Judgment normal.  ? ?Lab Results  ?Component Value Date  ? HGBA1C 6.3 (A) 01/25/2022  ? HGBA1C 6.3 (A) 08/31/2021  ? HGBA1C 8.1 (A) 04/20/2021  ? ? ?Lab Results  ?Component Value Date  ? WBC 7.9 08/31/2021  ? HGB 12.7 08/31/2021  ? HCT 38.6 08/31/2021  ? PLT 284 08/31/2021  ? GLUCOSE 90 08/31/2021  ? CHOL 158 08/31/2021  ? TRIG 266 (H) 08/31/2021  ? HDL 35 (L) 08/31/2021  ? [place] 79 08/31/2021  ? ALT 20 08/31/2021  ? AST 16 08/31/2021  ? NA 137 08/31/2021  ? K 4.0 08/31/2021  ? CL 101 08/31/2021  ? CREATININE 0.67 08/31/2021  ? BUN 12 08/31/2021  ? CO2 22 08/31/2021  ? TSH 1.460 11/07/2020  ? HGBA1C 6.3 (A) 01/25/2022  ? ? ? ?Assessment & Plan:  ?Shaqueta was seen today for diabetes. ? ?Diagnoses and all orders for this visit: ? ?Type 2 diabetes mellitus without complication, without long-term current use of insulin ([facility]) ?-     HgB A1c 6.3 stable [place] met Less than 6.5 hemoglobin A1c.  Continue to monitor foods that are high in carbohydrates are the following rice, potatoes, breads, sugars, and pastas.  Reduction in the intake (eating) will assist in lowering your blood sugars.  ?-     lisinopril (ZESTRIL) 10 MG tablet; Take 1 tablet (10 mg total) by mouth daily. ?-     sitaGLIPtin (JANUVIA) 100 MG tablet; Take 1 tablet (100 mg total) by mouth daily. ?-     glucose blood (TRUE METRIX BLOOD GLUCOSE TEST) test strip; USE AS DIRECTED 3 TIMES DAILY ? ?Elevated lipoprotein(a) ? Healthy lifestyle diet of fruits vegetables fish nuts whole grains and low saturated fat .  Foods high in cholesterol or liver, fatty meats,cheese, butter avocados, nuts and seeds, chocolate and fried foods. ?-     fenofibrate (TRICOR) 145 MG tablet; TAKE 1 TABLET (145 MG TOTAL) BY MOUTH DAILY. ?F/u fasting lipids ? ?Essential hypertension ?Goal met of less than 130/80, low-sodium, DASH diet, medication compliance, 150 minutes of moderate intensity exercise per  week. ?Discussed medication compliance, adverse effects.  ?-     lisinopril (ZESTRIL) 10 MG tablet; Take 1 tablet (10 mg total) by mouth daily. ? ?Seasonal allergic rhinitis due to pollen ?-     loratadine (CLARITIN) 10 MG tablet; Take 1 tablet (10 mg total) by mouth daily. ?-     fluticasone (FLONASE) 50 MCG/ACT nasal spray; Place 2 sprays into both nostrils daily. ? ? ?Other orders ?-     metFORMIN (GLUCOPHAGE-XR) 500 MG 24 hr tablet; Take 1 tablet (500 mg total) by mouth daily with breakfast. ? ?  ?I have discontinued Amadi Mejia-Perez's metFORMIN. I am also having her maintain her blood glucose meter kit and supplies, omeprazole, loratadine, fluticasone, lisinopril, gabapentin, sitaGLIPtin, fenofibrate, TRUEplus Lancets 28G, and True Metrix Blood Glucose Test. ? ?No orders of the defined types were placed in this encounter. ? ? ? ?Follow-up:  ? ?No follow-ups on file. ? ?The above assessment and management plan was discussed with the patient. The patient verbalized understanding of and has agreed to the management plan. Patient is aware to call the clinic if symptoms fail to improve or worsen. Patient is aware when to return to the clinic for a follow-up visit. Patient educated on when it is appropriate to go to the emergency department.  ? ?Juluis Mire, NP-C ? ?  ?

## 2022-02-01 ENCOUNTER — Other Ambulatory Visit: Payer: Self-pay

## 2022-02-09 ENCOUNTER — Other Ambulatory Visit: Payer: Self-pay

## 2022-03-19 ENCOUNTER — Other Ambulatory Visit: Payer: Self-pay

## 2022-03-21 ENCOUNTER — Other Ambulatory Visit: Payer: Self-pay

## 2022-04-02 ENCOUNTER — Other Ambulatory Visit: Payer: Self-pay

## 2022-04-04 ENCOUNTER — Other Ambulatory Visit: Payer: Self-pay

## 2022-04-05 ENCOUNTER — Ambulatory Visit (INDEPENDENT_AMBULATORY_CARE_PROVIDER_SITE_OTHER): Payer: Self-pay

## 2022-04-05 ENCOUNTER — Ambulatory Visit (INDEPENDENT_AMBULATORY_CARE_PROVIDER_SITE_OTHER): Payer: Self-pay | Admitting: *Deleted

## 2022-04-05 NOTE — Telephone Encounter (Signed)
Reason for Disposition  [1] Blood glucose 240 - 300 mg/dL (53.6 - 14.4 mmol/L) AND [2] does not  use insulin (e.g., not insulin-dependent; most people with type 2 diabetes)  Answer Assessment - Initial Assessment Questions 1. BLOOD GLUCOSE: "What is your blood glucose level?"      244 at 8:40 am before eating, 300 about 10 minutes ago - ate 2 hours ago 2. ONSET: "When did you check the blood glucose?"      3. USUAL RANGE: "What is your glucose level usually?" (e.g., usual fasting morning value, usual evening value)     Hasn't checked her Blood glucose for 1 month. Started to check again on Monday. Monday was 398. 4. KETONES: "Do you check for ketones (urine or blood test strips)?" If yes, ask: "What does the test show now?"       5. TYPE 1 or 2:  "Do you know what type of diabetes you have?"  (e.g., Type 1, Type 2, Gestational; doesn't know)      Type 2 6. INSULIN: "Do you take insulin?" "What type of insulin(s) do you use? What is the mode of delivery? (syringe, pen; injection or pump)?"      no 7. DIABETES PILLS: "Do you take any pills for your diabetes?" If yes, ask: "Have you missed taking any pills recently?"     Metformin 8. OTHER SYMPTOMS: "Do you have any symptoms?" (e.g., fever, frequent urination, difficulty breathing, dizziness, weakness, vomiting)     no 9. PREGNANCY: "Is there any chance you are pregnant?" "When was your last menstrual period?"     no  Protocols used: Diabetes - High Blood Sugar-A-AH

## 2022-04-05 NOTE — Telephone Encounter (Signed)
Routed to PCP 

## 2022-04-05 NOTE — Telephone Encounter (Signed)
  Chief Complaint: Elevated blood glucose Symptoms: Fatigue, elevated blood glucose Frequency: 1 month Pertinent Negatives: Patient denies chest pain, SOB, fever Disposition: [] ED /[] Urgent Care (no appt availability in office) / [] Appointment(In office/virtual)/ []  [facility]/ [] Home Care/ [] Refused Recommended Disposition /[] [facility]/ [x]  Follow-up with PCP Additional Notes: Pt was seen in May, at that appt, metformin dosage was reduced from 1000 to 500mg . Pt started new dose and went on vacation. Pt did not check her glucose levels for about 1 month.  Pt starting checking blood glucose on Monday. The first reading on Monday was 398. BG levels have been going down since then.  Pt is a bit fatigued, but otherwise ok. Pt is also a bit worried about these readings.  PT feels that her Metformin should be increased as that was working well for her. Please return pts' call regarding increasing dose of metformin.

## 2022-04-11 ENCOUNTER — Other Ambulatory Visit: Payer: Self-pay

## 2022-04-18 ENCOUNTER — Other Ambulatory Visit: Payer: Self-pay

## 2022-04-25 ENCOUNTER — Ambulatory Visit (INDEPENDENT_AMBULATORY_CARE_PROVIDER_SITE_OTHER): Payer: Self-pay | Admitting: Primary Care

## 2022-04-25 ENCOUNTER — Other Ambulatory Visit: Payer: Self-pay

## 2022-04-27 ENCOUNTER — Ambulatory Visit (INDEPENDENT_AMBULATORY_CARE_PROVIDER_SITE_OTHER): Payer: Self-pay | Admitting: Primary Care

## 2022-05-04 ENCOUNTER — Other Ambulatory Visit: Payer: Self-pay

## 2022-05-15 ENCOUNTER — Encounter (INDEPENDENT_AMBULATORY_CARE_PROVIDER_SITE_OTHER): Payer: Self-pay | Admitting: Primary Care

## 2022-05-15 ENCOUNTER — Other Ambulatory Visit: Payer: Self-pay

## 2022-05-15 ENCOUNTER — Ambulatory Visit (INDEPENDENT_AMBULATORY_CARE_PROVIDER_SITE_OTHER): Payer: Self-pay | Admitting: Primary Care

## 2022-05-15 VITALS — BP 111/74 | HR 71 | Temp 97.9°F | Ht 65.0 in | Wt 138.0 lb

## 2022-05-15 DIAGNOSIS — E7841 Elevated Lipoprotein(a): Secondary | ICD-10-CM

## 2022-05-15 DIAGNOSIS — N23 Unspecified renal colic: Secondary | ICD-10-CM

## 2022-05-15 DIAGNOSIS — Z23 Encounter for immunization: Secondary | ICD-10-CM

## 2022-05-15 DIAGNOSIS — I10 Essential (primary) hypertension: Secondary | ICD-10-CM

## 2022-05-15 DIAGNOSIS — E119 Type 2 diabetes mellitus without complications: Secondary | ICD-10-CM

## 2022-05-15 LAB — POCT URINALYSIS DIP (CLINITEK)
Bilirubin, UA: NEGATIVE
Blood, UA: NEGATIVE
Glucose, UA: NEGATIVE mg/dL
Ketones, POC UA: NEGATIVE mg/dL
Nitrite, UA: NEGATIVE
POC PROTEIN,UA: NEGATIVE
Spec Grav, UA: 1.015 (ref 1.010–1.025)
Urobilinogen, UA: 0.2 E.U./dL
pH, UA: 6 (ref 5.0–8.0)

## 2022-05-15 LAB — POCT GLYCOSYLATED HEMOGLOBIN (HGB A1C): Hemoglobin A1C: 8.5 % — AB (ref 4.0–5.6)

## 2022-05-15 MED ORDER — METFORMIN HCL ER 500 MG PO TB24
500.0000 mg | ORAL_TABLET | Freq: Two times a day (BID) | ORAL | 1 refills | Status: DC
  Filled 2022-05-15: qty 180, 90d supply, fill #0
  Filled 2022-05-22: qty 60, 30d supply, fill #0
  Filled 2022-07-09: qty 180, 90d supply, fill #1

## 2022-05-15 MED ORDER — FENOFIBRATE 145 MG PO TABS
ORAL_TABLET | Freq: Every day | ORAL | 1 refills | Status: DC
  Filled 2022-05-15: qty 90, 90d supply, fill #0

## 2022-05-15 MED ORDER — SITAGLIPTIN PHOSPHATE 100 MG PO TABS
100.0000 mg | ORAL_TABLET | Freq: Every day | ORAL | 1 refills | Status: AC
  Filled 2022-05-15: qty 90, 90d supply, fill #0

## 2022-05-15 MED ORDER — METFORMIN HCL ER 500 MG PO TB24
500.0000 mg | ORAL_TABLET | Freq: Every day | ORAL | 1 refills | Status: DC
  Filled 2022-05-15: qty 90, 90d supply, fill #0

## 2022-05-15 NOTE — Patient Instructions (Signed)
Gripe en los adultos Influenza, Adult A la gripe tambin se la conoce como "influenza". Es una infeccin en los pulmones, la nariz y la garganta (vas respiratorias). Se transmite fcilmente de persona a persona (es contagiosa). La gripe causa sntomas que son como los de un resfro, junto con fiebre alta y dolores corporales. Cules son las causas? La causa de esta afeccin es el virus de la influenza. Puede contraer el virus de las siguientes maneras: Al inhalar gotitas que quedan en el aire despus de que una persona infectada con gripe tosi o estornud. Al tocar algo que est contaminado con el virus y luego llevarse la mano a la boca, la nariz o los ojos. Qu incrementa el riesgo? Hay ciertas cosas que lo pueden hacer ms propenso a tener gripe. Estas incluyen lo siguiente: No lavarse las manos con frecuencia. Tener contacto cercano con muchas personas durante la temporada de resfro y gripe. Tocarse la boca, los ojos o la nariz sin antes lavarse las manos. No recibir la vacuna antigripal todos los aos. Puede correr un mayor riesgo de tener gripe, y problemas graves, como una infeccin pulmonar (neumona), si usted: Es mayor de 65 aos de edad. Est embarazada. Tiene debilitado el sistema que combate las defensas (sistema inmunitario) debido a una enfermedad o a que toma determinados medicamentos. Tiene una afeccin a largo plazo (crnica), como las siguientes: Enfermedad cardaca, renal o pulmonar. Diabetes. Asma. Tiene un trastorno heptico. Tiene mucho sobrepeso (obesidad mrbida). Tiene anemia. Cules son los signos o sntomas? Los sntomas normalmente comienzan de repente y duran entre 4 y 14 das. Pueden incluir los siguientes: Fiebre y escalofros. Dolores de cabeza, dolores en el cuerpo o dolores musculares. Dolor de garganta. Tos. Secrecin o congestin nasal. Molestias en el pecho. No querer comer tanto como lo hace normalmente. Sensacin de debilidad o  cansancio. Mareos. Malestar estomacal o vmitos. Cmo se trata? Si la gripe se detecta de forma temprana, puede recibir tratamiento con medicamentos antivirales. Esto puede ayudar a reducir la gravedad y la duracin de la enfermedad. Se los administrarn por boca o a travs de un tubo (catter) intravenoso. Cuidarse en su hogar puede ayudar a que mejoren los sntomas. El mdico puede recomendarle lo siguiente: Tomar medicamentos de venta libre. Beber abundante lquido. La gripe suele desaparecer sola. Si tiene sntomas muy graves u otros problemas, puede recibir tratamiento en un hospital. Siga estas instrucciones en su casa:     Actividad Descanse todo lo que sea necesario. Duerma lo suficiente. Qudese en su casa y no concurra al trabajo o a la escuela, como se lo haya indicado el mdico. No salga de su casa hasta que no haya tenido fiebre por 24 horas sin tomar medicamentos. Salga de su casa solamente para ir al mdico. Comida y bebida Tome una SRO (solucin de rehidratacin oral). Es una bebida que se vende en farmacias y tiendas. Beba suficiente lquido como para mantener la orina de color amarillo plido. En la medida en que pueda, beba lquidos transparentes en pequeas cantidades. Los lquidos transparentes son, por ejemplo: Agua. Trocitos de hielo. Jugo de frutas mezclado con agua. Bebidas deportivas de bajas caloras. Coma alimentos suaves que sean fciles de digerir. En la medida que pueda, consuma pequeas cantidades. Estos alimentos incluyen: Bananas. Pur de manzana. Arroz. Carnes magras. Tostadas. Galletas. No coma ni beba lo siguiente: Lquidos con alto contenido de azcar o cafena. Alcohol. Alimentos condimentados o con alto contenido de grasa. Indicaciones generales Use los medicamentos de venta libre y los   recetados solamente como se lo haya indicado el mdico. Use un humidificador de aire fro para que el aire de su casa est ms hmedo. Esto puede  facilitar la respiracin. Cuando utilice un humidificador de vapor fro, lmpielo a diario. Vace el agua y cmbiela por agua limpia. Al toser o estornudar, cbrase la boca y la nariz. Lvese las manos frecuentemente con agua y jabn y durante al menos 20 segundos. Esto tambin es importante despus de toser o estornudar. Si no dispone de agua y jabn, use desinfectante para manos con alcohol. Cumpla con todas las visitas de seguimiento. Cmo se previene?  Colquese la vacuna antigripal todos los aos. Puede colocarse la vacuna contra la gripe a fines de verano, en otoo o en invierno. Pregntele al mdico cundo debe aplicarse la vacuna contra la gripe. Evite el contacto con personas que estn enfermas durante el otoo y el invierno. Es la temporada del resfro y la gripe. Comunquese con un mdico si: Tiene sntomas nuevos. Tiene los siguientes sntomas: Dolor de pecho. Materia fecal lquida (diarrea). Fiebre. La tos empeora. Empieza a tener ms mucosidad. Tiene malestar estomacal. Vomita. Solicite ayuda de inmediato si: Le falta el aire. Tiene dificultad para respirar. La piel o las uas se ponen de un color azulado. Presenta dolor muy intenso o rigidez en el cuello. Tiene dolor de cabeza repentino. Le duele la cara o el odo de forma repentina. No puede comer ni beber sin vomitar. Estos sntomas pueden representar un problema grave que constituye una emergencia. Solicite atencin mdica de inmediato. Comunquese con el servicio de emergencias de su localidad (911 en los [place]). No espere a ver si los sntomas desaparecen. No conduzca por sus propios medios hasta el hospital. Resumen A la gripe tambin se la conoce como "influenza". Es una infeccin en los pulmones, la nariz y la garganta. Se transmite fcilmente de una persona a otra. Use los medicamentos de venta libre y los recetados solamente como se lo haya indicado el mdico. Aplicarse la vacuna contra la gripe  todos los aos es la mejor manera de no contagiarse la gripe. Esta informacin no tiene como fin reemplazar el consejo del mdico. Asegrese de hacerle al mdico cualquier pregunta que tenga. Document Revised: 07/07/2020 Document Reviewed: 07/07/2020 Elsevier Patient Education  2023 [employer] Inc.  

## 2022-05-15 NOTE — Progress Notes (Unsigned)
Subjective:  Patient ID: [name] Perry, female    DOB: 05-Jun-1976  Age: 46 y.o. MRN: [id number]  CC: Diabetes   HPI [name] presents for follow-up of diabetes.(interpreter Selena [phone] ) Patient does not check blood sugar at home. She voices concerns with burning with urination. Management of HTN- Patient has No headache, No chest pain, No abdominal pain - No Nausea, No new weakness tingling or numbness, No Cough - shortness of breath   Compliant with meds - No Checking CBGs? No  Fasting avg -   Postprandial average -  Exercising regularly? - Yes Watching carbohydrate intake? - Yes Neuropathy ? - No Hypoglycemic events - No  - Recovers with :   Pertinent ROS:  Polyuria - Yes Polydipsia - No Vision problems - No  Medications as noted below. Taking them regularly without complication/adverse reaction being reported today.   History Carlye has a past medical history of Anxiety, Ectopic pregnancy (march), Gestational diabetes, Heart palpitations, and Hypertension (2010).   She has a past surgical history that includes Umbilical hernia repair (N/A, 01/11/2014); Insertion of mesh (N/A, 01/11/2014); and Hernia repair.   Her family history includes Alcohol abuse in her father; Cirrhosis in her mother; Ulcers in her mother.She reports that she has never smoked. She has never used smokeless tobacco. She reports that she does not drink alcohol and does not use drugs.  Current Outpatient Medications on File Prior to Visit  Medication Sig Dispense Refill   lisinopril (ZESTRIL) 10 MG tablet Take 1 tablet (10 mg total) by mouth daily. 90 tablet 3   metFORMIN (GLUCOPHAGE-XR) 500 MG 24 hr tablet Take 1 tablet (500 mg total) by mouth daily with breakfast. 90 tablet 1   sitaGLIPtin (JANUVIA) 100 MG tablet Take 1 tablet (100 mg total) by mouth daily. 90 tablet 1   blood glucose meter kit and supplies KIT Dispense based on patient and insurance preference. Use up to four  times daily as directed. (FOR ICD-9 250.00, 250.01). 1 each 0   fenofibrate (TRICOR) 145 MG tablet TAKE 1 TABLET (145 MG TOTAL) BY MOUTH DAILY. 90 tablet 1   fluticasone (FLONASE) 50 MCG/ACT nasal spray Place 2 sprays into both nostrils daily. 16 g 6   gabapentin (NEURONTIN) 100 MG capsule Take 1 capsule (100 mg total) by mouth 3 (three) times daily. 90 capsule 1   glucose blood (TRUE METRIX BLOOD GLUCOSE TEST) test strip USE AS DIRECTED 3 TIMES DAILY 100 strip 11   loratadine (CLARITIN) 10 MG tablet Take 1 tablet (10 mg total) by mouth daily. 30 tablet 11   omeprazole (PRILOSEC) 20 MG capsule Take 1 capsule (20 mg total) by mouth daily. 30 capsule 3   TRUEplus Lancets 28G MISC USE AS DIRECTED 3 TIMES DAILY 100 each 12   [DISCONTINUED] atorvastatin (LIPITOR) 40 MG tablet Take 1 tablet (40 mg total) by mouth daily. 30 tablet 3   No current facility-administered medications on file prior to visit.    ROS Comprehensive ROS Pertinent positive and negative noted in HPI   Objective:  BP 111/74   Pulse 71   Temp 97.9 F (36.6 C) (Oral)   Ht 5' 5" (1.651 m)   Wt 138 lb (62.6 kg)   SpO2 98%   BMI 22.96 kg/m   BP Readings from Last 3 Encounters:  05/15/22 111/74  01/25/22 113/79  08/31/21 108/74    Wt Readings from Last 3 Encounters:  05/15/22 138 lb (62.6 kg)  01/25/22 141 lb 3.2 oz (64  kg)  08/31/21 144 lb 6.4 oz (65.5 kg)    Physical Exam Vitals reviewed.  Constitutional:      Comments: Body mass index is 22.96 kg/m.   HENT:     Head: Normocephalic.     Nose: Nose normal.  Eyes:     Extraocular Movements: Extraocular movements intact.  Cardiovascular:     Rate and Rhythm: Normal rate and regular rhythm.  Pulmonary:     Effort: Pulmonary effort is normal.     Breath sounds: Normal breath sounds.  Abdominal:     General: Abdomen is flat. Bowel sounds are normal.     Palpations: Abdomen is soft.  Musculoskeletal:        General: Normal range of motion.     Cervical  back: Normal range of motion.  Skin:    General: Skin is warm and dry.  Neurological:     Mental Status: She is alert and oriented to person, place, and time.  Psychiatric:        Mood and Affect: Mood normal.        Behavior: Behavior normal.   Lab Results  Component Value Date   HGBA1C 6.3 (A) 01/25/2022   HGBA1C 6.3 (A) 08/31/2021   HGBA1C 8.1 (A) 04/20/2021    Lab Results  Component Value Date   WBC 7.9 08/31/2021   HGB 12.7 08/31/2021   HCT 38.6 08/31/2021   PLT 284 08/31/2021   GLUCOSE 90 08/31/2021   CHOL 158 08/31/2021   TRIG 266 (H) 08/31/2021   HDL 35 (L) 08/31/2021   LDLCALC 79 08/31/2021   ALT 20 08/31/2021   AST 16 08/31/2021   NA 137 08/31/2021   K 4.0 08/31/2021   CL 101 08/31/2021   CREATININE 0.67 08/31/2021   BUN 12 08/31/2021   CO2 22 08/31/2021   TSH 1.460 11/07/2020   HGBA1C 6.3 (A) 01/25/2022     Assessment & Plan:   Lorelle was seen today for diabetes.  Diagnoses and all orders for this visit:  Type 2 diabetes mellitus without complication, without long-term current use of insulin (HCC) -     HgB A1c 8.5 previously 6.3 Discussed  co- morbidities with uncontrol diabetes  Complications -diabetic retinopathy, (close your eyes ? What do you see nothing) nephropathy decrease in kidney function- can lead to dialysis-on a machine 3 days a week to filter your kidney, neuropathy- numbness and tinging in your hands and feet,  increase risk of heart attack and stroke, and amputation due to decrease wound healing and circulation. Decrease your risk by taking medication daily as prescribed, monitor carbohydrates- foods that are high in carbohydrates are the following rice, potatoes, breads, sugars, and pastas.  Reduction in the intake (eating) will assist in lowering your blood sugars. Exercise daily at least 30 minutes daily.   Urinary tract pain POCT URINALYSIS DIP  Essential hypertension Blood pressure goal is at goal  less than 130/80, low-sodium,  DASH diet, medication compliance, 150 minutes of moderate intensity exercise per week. Discussed medication compliance, adverse effects.   Other orders -     metFORMIN (GLUCOPHAGE-XR) 500 MG 24 hr tablet; Take 1 tablet (500 mg total) by mouth daily with breakfast.     I am having Keeli Esteban maintain her blood glucose meter kit and supplies, omeprazole, gabapentin, TRUEplus Lancets 28G, fenofibrate, lisinopril, metFORMIN, sitaGLIPtin, loratadine, fluticasone, and True Metrix Blood Glucose Test.  No orders of the defined types were placed in this encounter.    Follow-up:  No follow-ups on file.  The above assessment and management plan was discussed with the patient. The patient verbalized understanding of and has agreed to the management plan. Patient is aware to call the clinic if symptoms fail to improve or worsen. Patient is aware when to return to the clinic for a follow-up visit. Patient educated on when it is appropriate to go to the emergency department.   Juluis Mire, NP-C

## 2022-05-18 LAB — URINE CULTURE

## 2022-05-20 ENCOUNTER — Other Ambulatory Visit (INDEPENDENT_AMBULATORY_CARE_PROVIDER_SITE_OTHER): Payer: Self-pay | Admitting: Primary Care

## 2022-05-20 DIAGNOSIS — N3000 Acute cystitis without hematuria: Secondary | ICD-10-CM

## 2022-05-20 MED ORDER — FLUCONAZOLE 150 MG PO TABS
150.0000 mg | ORAL_TABLET | Freq: Every day | ORAL | 1 refills | Status: DC
  Filled 2022-05-20: qty 1, 1d supply, fill #0

## 2022-05-20 MED ORDER — AMOXICILLIN-POT CLAVULANATE 875-125 MG PO TABS
1.0000 | ORAL_TABLET | Freq: Two times a day (BID) | ORAL | 0 refills | Status: DC
  Filled 2022-05-20: qty 20, 10d supply, fill #0

## 2022-05-21 ENCOUNTER — Other Ambulatory Visit: Payer: Self-pay

## 2022-05-22 ENCOUNTER — Telehealth (INDEPENDENT_AMBULATORY_CARE_PROVIDER_SITE_OTHER): Payer: Self-pay

## 2022-05-22 ENCOUNTER — Other Ambulatory Visit: Payer: Self-pay

## 2022-05-22 NOTE — Telephone Encounter (Signed)
Call placed to patient with the assistance of pacific interpreter [phone]) patient did not answer. Per DPR left voicemail informing of UTI. Instructed on how to take antibiotics and Diflucan. Advised to return call to office with any questions or concerns. Maryjean Morn, CMA

## 2022-05-22 NOTE — Telephone Encounter (Signed)
-----   Message from Grayce Sessions, NP sent at 05/20/2022 10:45 PM EDT ----- She has a UTI abt's sent and diflucan . Make sure to take abt's with food

## 2022-06-19 NOTE — Progress Notes (Deleted)
S:     PCP: Debby Clyne is a 46 y.o. female who presents for diabetes evaluation, education, and management.  PMH is significant for HTN and T2DM.   Patient was referred by Juluis Mire on 8/22. At last visit, her A1c was 8.5 from a previous 6.3 in 5/23. Patient reported she does not check her BG at home. She was sent an Rx for blood glucose testing device and supplies. She was restarted on Metformin and continued on Januvia at this time.   Today, patient arrives in *** good spirits and presents without *** any assistance. ***  Patient reports Diabetes was diagnosed in ***.   Family/Social History:  - Fhx: cirrhosis, alcohol abuse - Social hx: none reported  Current diabetes medications include: metformin 500mg  XR 1 tablet BID, Januvia 100mg  daily Current hypertension medications include: lisinopril 10mg  once daily Current hyperlipidemia medications include: fenofibrate 145mg  once daily  Patient reports adherence to taking all medications as prescribed.  *** Patient denies adherence with medications, reports missing *** medications *** times per week, on average.  Do you feel that your medications are working for you? {YES NO:22349} Have you been experiencing any side effects to the medications prescribed? {YES NO:22349} Do you have any problems obtaining medications due to transportation or finances? {YES [id number] Insurance coverage: ***  Patient {Actions; denies-reports:120008} hypoglycemic events.  Reported home fasting blood sugars: ***  Reported 2 hour post-meal/random blood sugars: ***.  Patient {Actions; denies-reports:120008} nocturia (nighttime urination).  Patient {Actions; denies-reports:120008} neuropathy (nerve pain). Patient {Actions; denies-reports:120008} visual changes. Patient {Actions; denies-reports:120008} self foot exams.   Patient reported dietary habits: Eats *** meals/day Breakfast: *** Lunch: *** Dinner:  *** Snacks: *** Drinks: ***  Within the past 12 months, did you worry whether your food would run out before you got money to buy more? {YES NO:22349} Within the past 12 months, did the food you bought run out, and you didn't have money to get more? {YES NO:22349} PHQ-9 Score: ***  Patient-reported exercise habits: ***   O:    Lab Results  Component Value Date   HGBA1C 8.5 (A) 05/15/2022   There were no vitals filed for this visit.  Lipid Panel     Component Value Date/Time   CHOL 158 08/31/2021 1137   TRIG 266 (H) 08/31/2021 1137   HDL 35 (L) 08/31/2021 1137   CHOLHDL 4.5 (H) 08/31/2021 1137   LDLCALC 79 08/31/2021 1137    Clinical Atherosclerotic Cardiovascular Disease (ASCVD): No  The 10-year ASCVD risk score (Arnett DK, et al., 2019) is: 2.2%   Values used to calculate the score:     Age: 15 years     Sex: Female     Is Non-Hispanic African American: No     Diabetic: Yes     Tobacco smoker: No     Systolic Blood Pressure: 831 mmHg     Is BP treated: Yes     HDL Cholesterol: 35 mg/dL     Total Cholesterol: 158 mg/dL   A/P: Diabetes longstanding currently uncontrolled. Patient is *** able to verbalize appropriate hypoglycemia management plan. Medication adherence appears ***. Control is suboptimal due to ***. -{Meds adjust:18428} basal insulin *** (insulin ***). Patient will continue to titrate 1 unit every *** days if fasting blood sugar > 100mg /dl until fasting blood sugars reach goal or next visit.  -{Meds adjust:18428} rapid insulin *** (insulin ***) to ***.  -{Meds adjust:18428} GLP-1 *** (generic ***) to ***.  -{Meds adjust:18428}  SGLT2-I *** (generic ***) to ***. Counseled on sick day rules. -{Meds adjust:18428} metformin *** to ***.  -Patient educated on purpose, proper use, and potential adverse effects of ***.  -Extensively discussed pathophysiology of diabetes, recommended lifestyle interventions, dietary effects on blood sugar control.  -Counseled on  s/sx of and management of hypoglycemia.  -Next A1c anticipated ***.   ASCVD risk - primary prevention in patient with diabetes. Last LDL is not at goal of <70 mg/dL. ASCVD risk factors include DM, HTN and 10-year ASCVD risk score of 2.2%. moderate intensity statin indicated.  -{Meds adjust:18428} ***statin *** mg.   Hypertension longstanding currently controlled. Blood pressure goal of <130/80 mmHg. Medication adherence ***. Blood pressure control is suboptimal due to ***. -***  Written patient instructions provided. Patient verbalized understanding of treatment plan.  Total time in face to face counseling *** minutes.    Follow-up:  Pharmacist ***. PCP clinic visit in November  Irish Elders, PharmD PGY-1 [facility] Resident

## 2022-06-21 ENCOUNTER — Ambulatory Visit: Payer: Self-pay | Attending: Primary Care | Admitting: Pharmacist

## 2022-07-09 ENCOUNTER — Other Ambulatory Visit: Payer: Self-pay

## 2022-07-10 ENCOUNTER — Other Ambulatory Visit: Payer: Self-pay

## 2022-07-17 ENCOUNTER — Other Ambulatory Visit: Payer: Self-pay

## 2022-08-01 ENCOUNTER — Other Ambulatory Visit: Payer: Self-pay

## 2022-08-23 ENCOUNTER — Ambulatory Visit (INDEPENDENT_AMBULATORY_CARE_PROVIDER_SITE_OTHER): Payer: No Typology Code available for payment source | Admitting: Primary Care

## 2022-09-05 ENCOUNTER — Ambulatory Visit (INDEPENDENT_AMBULATORY_CARE_PROVIDER_SITE_OTHER): Payer: No Typology Code available for payment source | Admitting: Primary Care

## 2022-10-01 ENCOUNTER — Other Ambulatory Visit: Payer: Self-pay

## 2022-10-04 ENCOUNTER — Encounter (INDEPENDENT_AMBULATORY_CARE_PROVIDER_SITE_OTHER): Payer: Self-pay | Admitting: Primary Care

## 2022-10-04 ENCOUNTER — Ambulatory Visit (INDEPENDENT_AMBULATORY_CARE_PROVIDER_SITE_OTHER): Payer: Self-pay | Admitting: Primary Care

## 2022-10-04 ENCOUNTER — Other Ambulatory Visit: Payer: Self-pay

## 2022-10-04 VITALS — BP 118/76 | HR 66 | Resp 16 | Ht 65.0 in | Wt 142.6 lb

## 2022-10-04 DIAGNOSIS — E7841 Elevated Lipoprotein(a): Secondary | ICD-10-CM

## 2022-10-04 DIAGNOSIS — I10 Essential (primary) hypertension: Secondary | ICD-10-CM

## 2022-10-04 DIAGNOSIS — E119 Type 2 diabetes mellitus without complications: Secondary | ICD-10-CM

## 2022-10-04 DIAGNOSIS — Z1159 Encounter for screening for other viral diseases: Secondary | ICD-10-CM

## 2022-10-04 DIAGNOSIS — Z760 Encounter for issue of repeat prescription: Secondary | ICD-10-CM

## 2022-10-04 DIAGNOSIS — J301 Allergic rhinitis due to pollen: Secondary | ICD-10-CM

## 2022-10-04 DIAGNOSIS — Z1211 Encounter for screening for malignant neoplasm of colon: Secondary | ICD-10-CM

## 2022-10-04 LAB — POCT GLYCOSYLATED HEMOGLOBIN (HGB A1C): HbA1c, POC (controlled diabetic range): 6.7 % (ref 0.0–7.0)

## 2022-10-04 MED ORDER — FENOFIBRATE 145 MG PO TABS
145.0000 mg | ORAL_TABLET | Freq: Every day | ORAL | 1 refills | Status: DC
  Filled 2022-10-04: qty 90, 90d supply, fill #0
  Filled 2022-10-15: qty 30, 30d supply, fill #0

## 2022-10-04 MED ORDER — LORATADINE 10 MG PO TABS
10.0000 mg | ORAL_TABLET | Freq: Every day | ORAL | 11 refills | Status: AC
  Filled 2022-10-04: qty 30, 30d supply, fill #0

## 2022-10-04 MED ORDER — TRUE METRIX BLOOD GLUCOSE TEST VI STRP
ORAL_STRIP | 11 refills | Status: AC
  Filled 2022-10-04 – 2022-10-15 (×2): qty 100, 33d supply, fill #0
  Filled 2023-01-22: qty 100, 33d supply, fill #1

## 2022-10-04 MED ORDER — METFORMIN HCL ER 500 MG PO TB24
500.0000 mg | ORAL_TABLET | Freq: Two times a day (BID) | ORAL | 1 refills | Status: DC
  Filled 2022-10-04: qty 60, 30d supply, fill #0
  Filled 2022-10-15: qty 180, 90d supply, fill #0
  Filled 2023-01-22: qty 180, 90d supply, fill #1

## 2022-10-04 MED ORDER — LISINOPRIL 10 MG PO TABS
10.0000 mg | ORAL_TABLET | Freq: Every day | ORAL | 3 refills | Status: AC
  Filled 2022-10-04 – 2022-10-15 (×2): qty 90, 90d supply, fill #0
  Filled 2023-01-15: qty 90, 90d supply, fill #1
  Filled 2023-05-06: qty 90, 90d supply, fill #2
  Filled 2023-09-06: qty 90, 90d supply, fill #3

## 2022-10-04 NOTE — Progress Notes (Signed)
Subjective:  Patient ID: [name] Perry, female    DOB: 1976-06-09  Age: 47 y.o. MRN: [id number]  CC: Diabetes   HPI [name] Perry is a 47 year old female [name] [phone] interrupter )presents for follow-up of diabetes. Patient does  check blood sugar at home and HTN- Patient has No headache, No chest pain, No abdominal pain - No Nausea, No new weakness tingling or numbness, No Cough - shortness of breath   Compliant with meds - Yes Checking CBGs? Yes  Fasting avg - 90-110    Postprandial average -  Exercising regularly? - Yes Watching carbohydrate intake? - Yes Neuropathy ? - No Hypoglycemic events - No  - Recovers with :   Pertinent ROS:  Polyuria - No Polydipsia - No Vision problems - No  Medications as noted below. Taking them regularly without complication/adverse reaction being reported today.   History Solaris has a past medical history of Anxiety, Ectopic pregnancy (march), Gestational diabetes, Heart palpitations, and Hypertension (2010).   She has a past surgical history that includes Umbilical hernia repair (N/A, 01/11/2014); Insertion of mesh (N/A, 01/11/2014); and Hernia repair.   Her family history includes Alcohol abuse in her father; Cirrhosis in her mother; Ulcers in her mother.She reports that she has never smoked. She has never used smokeless tobacco. She reports that she does not drink alcohol and does not use drugs.  Current Outpatient Medications on File Prior to Visit  Medication Sig Dispense Refill   amoxicillin-clavulanate (AUGMENTIN) 875-125 MG tablet Take 1 tablet by mouth 2 (two) times daily. (Patient not taking: Reported on 10/04/2022) 20 tablet 0   blood glucose meter kit and supplies KIT Dispense based on patient and insurance preference. Use up to four times daily as directed. (FOR ICD-9 250.00, 250.01). 1 each 0   fluconazole (DIFLUCAN) 150 MG tablet Take 1 tablet (150 mg total) by mouth daily. (Patient not taking: Reported on  10/04/2022) 1 tablet 1   fluticasone (FLONASE) 50 MCG/ACT nasal spray Place 2 sprays into both nostrils daily. 16 g 6   omeprazole (PRILOSEC) 20 MG capsule Take 1 capsule (20 mg total) by mouth daily. 30 capsule 3   sitaGLIPtin (JANUVIA) 100 MG tablet Take 1 tablet (100 mg total) by mouth daily. 90 tablet 1   TRUEplus Lancets 28G MISC USE AS DIRECTED 3 TIMES DAILY 100 each 12   [DISCONTINUED] atorvastatin (LIPITOR) 40 MG tablet Take 1 tablet (40 mg total) by mouth daily. 30 tablet 3   No current facility-administered medications on file prior to visit.    ROS Comprehensive ROS Pertinent positive and negative noted in HPI    Objective:  Blood Pressure 118/76   Pulse 66   Respiration 16   Height 5\' 5"  (1.651 m)   Weight 142 lb 9.6 oz (64.7 kg)   Oxygen Saturation 99%   Body Mass Index 23.73 kg/m   BP Readings from Last 3 Encounters:  10/04/22 118/76  05/15/22 111/74  01/25/22 113/79    Wt Readings from Last 3 Encounters:  10/04/22 142 lb 9.6 oz (64.7 kg)  05/15/22 138 lb (62.6 kg)  01/25/22 141 lb 3.2 oz (64 kg)    Physical Exam Constitutional:      Appearance: Normal appearance. She is normal weight.  HENT:     Head: Normocephalic.     Right Ear: External ear normal.     Left Ear: External ear normal.     Nose: Nose normal.  Eyes:     Extraocular  Movements: Extraocular movements intact.     Pupils: Pupils are equal, round, and reactive to light.  Cardiovascular:     Rate and Rhythm: Normal rate and regular rhythm.  Pulmonary:     Effort: Pulmonary effort is normal.     Breath sounds: Normal breath sounds.  Abdominal:     General: Abdomen is flat. Bowel sounds are normal.     Palpations: Abdomen is soft.  Musculoskeletal:        General: Normal range of motion.     Cervical back: Normal range of motion and neck supple.  Skin:    General: Skin is warm and dry.  Neurological:     Mental Status: She is alert and oriented to person, place, and time.  Psychiatric:         Mood and Affect: Mood normal.        Behavior: Behavior normal.     Lab Results  Component Value Date   HGBA1C 6.7 10/04/2022   HGBA1C 8.5 (A) 05/15/2022   HGBA1C 6.3 (A) 01/25/2022    Lab Results  Component Value Date   WBC 7.9 08/31/2021   HGB 12.7 08/31/2021   HCT 38.6 08/31/2021   PLT 284 08/31/2021   GLUCOSE 90 08/31/2021   CHOL 158 08/31/2021   TRIG 266 (H) 08/31/2021   HDL 35 (L) 08/31/2021   LDLCALC 79 08/31/2021   ALT 20 08/31/2021   AST 16 08/31/2021   NA 137 08/31/2021   K 4.0 08/31/2021   CL 101 08/31/2021   CREATININE 0.67 08/31/2021   BUN 12 08/31/2021   CO2 22 08/31/2021   TSH 1.460 11/07/2020   HGBA1C 6.7 10/04/2022     Assessment & Plan:   [name] was seen today for diabetes.  Diagnoses and all orders for this visit:  Type 2 diabetes mellitus without complication, without long-term current use of insulin ([place]) - educated on lifestyle modifications, including but not limited to diet choices and adding exercise to daily routine.   D/c Januvia-     POCT glycosylated hemoglobin (Hb A1C) 6.7 -     Microalbumin / creatinine urine ratio POCT glycosylated hemoglobin (Hb A1C) -     Ambulatory referral to Ophthalmology -     glucose blood (TRUE METRIX BLOOD GLUCOSE TEST) test strip; USE AS DIRECTED 3 TIMES DAILY  Encounter for HCV screening test for low risk patient -     HCV Ab w Reflex to Quant PCR  Encounter for HCV screening test for low risk patient -     HCV Ab w Reflex to Quant PCR  Encounter for diabetic foot exam ([place]) Completed  Medication refill -     fenofibrate (TRICOR) 145 MG tablet; TAKE 1 TABLET (145 MG TOTAL) BY MOUTH DAILY. -     lisinopril (ZESTRIL) 10 MG tablet; Take 1 tablet (10 mg total) by mouth daily. -     metFORMIN (GLUCOPHAGE-XR) 500 MG 24 hr tablet; Take 1 tablet (500 mg total) by mouth 2 (two) times daily after a meal.  Elevated lipoprotein(a) -     fenofibrate (TRICOR) 145 MG tablet; TAKE 1 TABLET (145 MG  TOTAL) BY MOUTH DAILY.  Essential hypertension Well controlled BP goal - < 130/80 DIET: Limit salt intake, read nutrition labels to check salt content, limit fried and high fatty foods  Avoid using multisymptom OTC cold preparations that generally contain sudafed which can rise BP. Consult with pharmacist on best cold relief products to use for persons with HTN EXERCISE  Discussed incorporating exercise such as walking - 30 minutes most days of the week and can do in 10 minute intervals    -     lisinopril (ZESTRIL) 10 MG tablet; Take 1 tablet (10 mg total) by mouth daily.  Seasonal allergic rhinitis due to pollen -     loratadine (CLARITIN) 10 MG tablet; Take 1 tablet (10 mg total) by mouth daily.  Colon cancer screening -     Fecal occult blood, imunochemical; Future  Other orders -     Interpretation:     I am having Korynne Mejia-Perez maintain her blood glucose meter kit and supplies, omeprazole, TRUEplus Lancets 28G, fluticasone, sitaGLIPtin, amoxicillin-clavulanate, fluconazole, fenofibrate, True Metrix Blood Glucose Test, lisinopril, loratadine, and metFORMIN.  Meds ordered this encounter  Medications   fenofibrate (TRICOR) 145 MG tablet    Sig: TAKE 1 TABLET (145 MG TOTAL) BY MOUTH DAILY.    Dispense:  90 tablet    Refill:  1    Order Specific Question:   Supervising Provider    Answer:   Tresa Garter [[id number]]   glucose blood (TRUE METRIX BLOOD GLUCOSE TEST) test strip    Sig: USE AS DIRECTED 3 TIMES DAILY    Dispense:  100 strip    Refill:  11    Order Specific Question:   Supervising Provider    Answer:   Tresa Garter [[id number]]   lisinopril (ZESTRIL) 10 MG tablet    Sig: Take 1 tablet (10 mg total) by mouth daily.    Dispense:  90 tablet    Refill:  3    Order Specific Question:   Supervising Provider    Answer:   Tresa Garter [[id number]]   loratadine (CLARITIN) 10 MG tablet    Sig: Take 1 tablet (10 mg total) by mouth daily.    Dispense:   30 tablet    Refill:  11    Order Specific Question:   Supervising Provider    Answer:   Tresa Garter [[id number]]   metFORMIN (GLUCOPHAGE-XR) 500 MG 24 hr tablet    Sig: Take 1 tablet (500 mg total) by mouth 2 (two) times daily after a meal.    Dispense:  180 tablet    Refill:  1    Order Specific Question:   Supervising Provider    AnswerTresa Garter [id number]     Follow-up:   Return in about 3 months (around 01/03/2023) for DM.  The above assessment and management plan was discussed with the patient. The patient verbalized understanding of and has agreed to the management plan. Patient is aware to call the clinic if symptoms fail to improve or worsen. Patient is aware when to return to the clinic for a follow-up visit. Patient educated on when it is appropriate to go to the emergency department.   Juluis Mire, NP-C

## 2022-10-05 LAB — MICROALBUMIN / CREATININE URINE RATIO
Creatinine, Urine: 64.8 mg/dL
Microalb/Creat Ratio: 6 mg/g creat (ref 0–29)
Microalbumin, Urine: 3.7 ug/mL

## 2022-10-05 LAB — HCV INTERPRETATION

## 2022-10-05 LAB — HCV AB W REFLEX TO QUANT PCR: HCV Ab: NONREACTIVE

## 2022-10-10 ENCOUNTER — Telehealth (INDEPENDENT_AMBULATORY_CARE_PROVIDER_SITE_OTHER): Payer: Self-pay

## 2022-10-10 ENCOUNTER — Other Ambulatory Visit: Payer: Self-pay

## 2022-10-10 NOTE — Telephone Encounter (Signed)
[employer] [facility] [place]  Id# [id number]  contacted pt to go over lab results pt is aware and doesn't have any questions or concerns  

## 2022-10-15 ENCOUNTER — Other Ambulatory Visit: Payer: Self-pay

## 2022-10-19 ENCOUNTER — Other Ambulatory Visit: Payer: Self-pay

## 2022-11-09 ENCOUNTER — Other Ambulatory Visit: Payer: Self-pay

## 2023-01-04 ENCOUNTER — Ambulatory Visit (INDEPENDENT_AMBULATORY_CARE_PROVIDER_SITE_OTHER): Payer: No Typology Code available for payment source | Admitting: Primary Care

## 2023-01-10 ENCOUNTER — Ambulatory Visit (INDEPENDENT_AMBULATORY_CARE_PROVIDER_SITE_OTHER): Payer: No Typology Code available for payment source | Admitting: Primary Care

## 2023-01-15 ENCOUNTER — Other Ambulatory Visit: Payer: Self-pay

## 2023-01-22 ENCOUNTER — Other Ambulatory Visit: Payer: Self-pay

## 2023-01-29 ENCOUNTER — Ambulatory Visit (INDEPENDENT_AMBULATORY_CARE_PROVIDER_SITE_OTHER): Payer: No Typology Code available for payment source | Admitting: Primary Care

## 2023-02-13 ENCOUNTER — Ambulatory Visit (INDEPENDENT_AMBULATORY_CARE_PROVIDER_SITE_OTHER): Payer: No Typology Code available for payment source | Admitting: Primary Care

## 2023-02-25 ENCOUNTER — Encounter (INDEPENDENT_AMBULATORY_CARE_PROVIDER_SITE_OTHER): Payer: Self-pay | Admitting: Primary Care

## 2023-02-25 ENCOUNTER — Other Ambulatory Visit: Payer: Self-pay

## 2023-02-25 ENCOUNTER — Ambulatory Visit (INDEPENDENT_AMBULATORY_CARE_PROVIDER_SITE_OTHER): Payer: Self-pay | Admitting: Primary Care

## 2023-02-25 VITALS — BP 106/71 | HR 78 | Resp 16 | Wt 137.4 lb

## 2023-02-25 DIAGNOSIS — E119 Type 2 diabetes mellitus without complications: Secondary | ICD-10-CM

## 2023-02-25 DIAGNOSIS — R300 Dysuria: Secondary | ICD-10-CM

## 2023-02-25 LAB — POCT URINALYSIS DIP (CLINITEK)
Bilirubin, UA: NEGATIVE
Blood, UA: NEGATIVE
Glucose, UA: 100 mg/dL — AB
Ketones, POC UA: NEGATIVE mg/dL
Nitrite, UA: NEGATIVE
POC PROTEIN,UA: NEGATIVE
Spec Grav, UA: 1.01 (ref 1.010–1.025)
Urobilinogen, UA: 1 E.U./dL
pH, UA: 6 (ref 5.0–8.0)

## 2023-02-25 MED ORDER — PHENAZOPYRIDINE HCL 100 MG PO TABS
100.0000 mg | ORAL_TABLET | Freq: Three times a day (TID) | ORAL | 0 refills | Status: AC | PRN
Start: 2023-02-25 — End: ?
  Filled 2023-02-25: qty 10, 4d supply, fill #0

## 2023-02-25 NOTE — Progress Notes (Signed)
Subjective:  Patient ID: [name], female    DOB: Aug 11, 1976  Age: 47 y.o. MRN: [id number]  CC: Diabetes   HPI Ms. Ahja Cegielski is a 47 year old female Aram Beecham [phone] interrupter ) presents for follow-up of diabetes. Patient does  check blood sugar at home and HTN- Patient has No headache, No chest pain, No abdominal pain - No Nausea, No new weakness tingling or numbness, No Cough - shortness of breath . Major concern is when at work your urination feels hot but when at home feels its a normal temperate   Compliant with meds - Yes Checking CBGs? Yes  Fasting avg - 120-180  Postprandial average -  Exercising regularly? - Yes Watching carbohydrate intake? - Yes Neuropathy ? - No Hypoglycemic events - No  - Recovers with :   Pertinent ROS:  Polyuria - yes  Polydipsia - No Vision problems - No  Medications as noted below. Taking them regularly without complication/adverse reaction being reported today.   History Jurlene has a past medical history of Anxiety, Ectopic pregnancy (march), Gestational diabetes, Heart palpitations, and Hypertension (2010).   She has a past surgical history that includes Umbilical hernia repair (N/A, 01/11/2014); Insertion of mesh (N/A, 01/11/2014); and Hernia repair.   Her family history includes Alcohol abuse in her father; Cirrhosis in her mother; Ulcers in her mother.She reports that she has never smoked. She has never used smokeless tobacco. She reports that she does not drink alcohol and does not use drugs.  Current Outpatient Medications on File Prior to Visit  Medication Sig Dispense Refill   amoxicillin-clavulanate (AUGMENTIN) 875-125 MG tablet Take 1 tablet by mouth 2 (two) times daily. (Patient not taking: Reported on 10/04/2022) 20 tablet 0   blood glucose meter kit and supplies KIT Dispense based on patient and insurance preference. Use up to four times daily as directed. (FOR ICD-9 250.00, 250.01). 1 each 0   fenofibrate  (TRICOR) 145 MG tablet Take 1 tablet (145 mg total) by mouth daily. 90 tablet 1   fluconazole (DIFLUCAN) 150 MG tablet Take 1 tablet (150 mg total) by mouth daily. (Patient not taking: Reported on 10/04/2022) 1 tablet 1   fluticasone (FLONASE) 50 MCG/ACT nasal spray Place 2 sprays into both nostrils daily. 16 g 6   glucose blood (TRUE METRIX BLOOD GLUCOSE TEST) test strip USE AS DIRECTED 3 TIMES DAILY 100 strip 11   lisinopril (ZESTRIL) 10 MG tablet Take 1 tablet (10 mg total) by mouth daily. 90 tablet 3   loratadine (CLARITIN) 10 MG tablet Take 1 tablet (10 mg total) by mouth daily. 30 tablet 11   metFORMIN (GLUCOPHAGE-XR) 500 MG 24 hr tablet Take 1 tablet (500 mg total) by mouth 2 (two) times daily after a meal. 180 tablet 1   omeprazole (PRILOSEC) 20 MG capsule Take 1 capsule (20 mg total) by mouth daily. 30 capsule 3   sitaGLIPtin (JANUVIA) 100 MG tablet Take 1 tablet (100 mg total) by mouth daily. 90 tablet 1   [DISCONTINUED] atorvastatin (LIPITOR) 40 MG tablet Take 1 tablet (40 mg total) by mouth daily. 30 tablet 3   No current facility-administered medications on file prior to visit.    ROS Comprehensive ROS Pertinent positive and negative noted in HPI    Objective:  Blood Pressure 106/71   Pulse 78   Respiration 16   Weight 137 lb 6.4 oz (62.3 kg)   Oxygen Saturation 100%   Body Mass Index 22.86 kg/m   BP Readings  from Last 3 Encounters:  02/25/23 106/71  10/04/22 118/76  05/15/22 111/74    Wt Readings from Last 3 Encounters:  02/25/23 137 lb 6.4 oz (62.3 kg)  10/04/22 142 lb 9.6 oz (64.7 kg)  05/15/22 138 lb (62.6 kg)    Physical Exam Vitals reviewed.  Constitutional:      Appearance: Normal appearance. She is normal weight.  HENT:     Head: Normocephalic.     Right Ear: External ear normal.     Left Ear: External ear normal.     Nose: Nose normal.  Eyes:     Extraocular Movements: Extraocular movements intact.     Pupils: Pupils are equal, round, and reactive  to light.  Cardiovascular:     Rate and Rhythm: Normal rate and regular rhythm.  Pulmonary:     Effort: Pulmonary effort is normal.     Breath sounds: Normal breath sounds.  Abdominal:     General: Abdomen is flat. Bowel sounds are normal.     Palpations: Abdomen is soft.  Musculoskeletal:        General: Normal range of motion.     Cervical back: Normal range of motion and neck supple.  Skin:    General: Skin is warm and dry.  Neurological:     Mental Status: She is alert and oriented to person, place, and time.  Psychiatric:        Mood and Affect: Mood normal.        Behavior: Behavior normal.    Lab Results  Component Value Date   HGBA1C 6.7 10/04/2022   HGBA1C 8.5 (A) 05/15/2022   HGBA1C 6.3 (A) 01/25/2022    Lab Results  Component Value Date   WBC 7.9 08/31/2021   HGB 12.7 08/31/2021   HCT 38.6 08/31/2021   PLT 284 08/31/2021   GLUCOSE 90 08/31/2021   CHOL 158 08/31/2021   TRIG 266 (H) 08/31/2021   HDL 35 (L) 08/31/2021   LDLCALC 79 08/31/2021   ALT 20 08/31/2021   AST 16 08/31/2021   NA 137 08/31/2021   K 4.0 08/31/2021   CL 101 08/31/2021   CREATININE 0.67 08/31/2021   BUN 12 08/31/2021   CO2 22 08/31/2021   TSH 1.460 11/07/2020   HGBA1C 6.7 10/04/2022     Assessment & Plan:   Kamylah was seen today for diabetes.  Diagnoses and all orders for this visit:  Type 2 diabetes mellitus without complication, without long-term current use of insulin ([facility]) - educated on lifestyle modifications, including but not limited to diet choices and adding exercise to daily routine.    POCT glycosylated hemoglobin (Hb A1C) -     Microalbumin / creatinine urine ratio POCT glycosylated hemoglobin (Hb A1C)  Elevated lipoprotein(a) -     fenofibrate (TRICOR) 145 MG tablet; TAKE 1 TABLET (145 MG TOTAL) BY MOUTH DAILY.  Essential hypertension Well controlled BP goal - < 130/80 DIET: Limit salt intake, read nutrition labels to check salt content, limit fried and  high fatty foods  Avoid using multisymptom OTC cold preparations that generally contain sudafed which can rise BP. Consult with pharmacist on best cold relief products to use for persons with HTN EXERCISE Discussed incorporating exercise such as walking - 30 minutes most days of the week and can do in 10 minute intervals    -     lisinopril (ZESTRIL) 10 MG tablet; Take 1 tablet (10 mg total) by mouth daily.   Follow-up:   No follow-ups on  file.  The above assessment and management plan was discussed with the patient. The patient verbalized understanding of and has agreed to the management plan. Patient is aware to call the clinic if symptoms fail to improve or worsen. Patient is aware when to return to the clinic for a follow-up visit. Patient educated on when it is appropriate to go to the emergency department.   Gwinda Passe, NP-C

## 2023-02-26 ENCOUNTER — Other Ambulatory Visit (INDEPENDENT_AMBULATORY_CARE_PROVIDER_SITE_OTHER): Payer: Self-pay

## 2023-02-26 DIAGNOSIS — I10 Essential (primary) hypertension: Secondary | ICD-10-CM

## 2023-02-26 DIAGNOSIS — Z13220 Encounter for screening for lipoid disorders: Secondary | ICD-10-CM

## 2023-02-26 DIAGNOSIS — Z7984 Long term (current) use of oral hypoglycemic drugs: Secondary | ICD-10-CM

## 2023-02-26 DIAGNOSIS — E119 Type 2 diabetes mellitus without complications: Secondary | ICD-10-CM

## 2023-02-26 LAB — MICROALBUMIN / CREATININE URINE RATIO
Creatinine, Urine: 48.4 mg/dL
Microalb/Creat Ratio: 7 mg/g creat (ref 0–29)
Microalbumin, Urine: 3.6 ug/mL

## 2023-02-27 LAB — CMP14+EGFR
ALT: 24 IU/L (ref 0–32)
AST: 18 IU/L (ref 0–40)
Albumin/Globulin Ratio: 1.2 (ref 1.2–2.2)
Albumin: 4.1 g/dL (ref 3.9–4.9)
Alkaline Phosphatase: 93 IU/L (ref 44–121)
BUN/Creatinine Ratio: 11 (ref 9–23)
BUN: 6 mg/dL (ref 6–24)
Bilirubin Total: 0.2 mg/dL (ref 0.0–1.2)
CO2: 23 mmol/L (ref 20–29)
Calcium: 9 mg/dL (ref 8.7–10.2)
Chloride: 102 mmol/L (ref 96–106)
Creatinine, Ser: 0.53 mg/dL — ABNORMAL LOW (ref 0.57–1.00)
Globulin, Total: 3.3 g/dL (ref 1.5–4.5)
Glucose: 181 mg/dL — ABNORMAL HIGH (ref 70–99)
Potassium: 4.3 mmol/L (ref 3.5–5.2)
Sodium: 138 mmol/L (ref 134–144)
Total Protein: 7.4 g/dL (ref 6.0–8.5)
eGFR: 116 mL/min/{1.73_m2} (ref >59)

## 2023-02-27 LAB — CBC WITH DIFFERENTIAL/PLATELET
Basophils Absolute: 0.1 10*3/uL (ref 0.0–0.2)
Basos: 1 %
EOS (ABSOLUTE): 0.2 10*3/uL (ref 0.0–0.4)
Eos: 3 %
Hematocrit: 41.9 % (ref 34.0–46.6)
Hemoglobin: 13.5 g/dL (ref 11.1–15.9)
Immature Grans (Abs): 0 10*3/uL (ref 0.0–0.1)
Immature Granulocytes: 0 %
Lymphocytes Absolute: 2.5 10*3/uL (ref 0.7–3.1)
Lymphs: 42 %
MCH: 29 pg (ref 26.6–33.0)
MCHC: 32.2 g/dL (ref 31.5–35.7)
MCV: 90 fL (ref 79–97)
Monocytes Absolute: 0.2 10*3/uL (ref 0.1–0.9)
Monocytes: 3 %
Neutrophils Absolute: 3 10*3/uL (ref 1.4–7.0)
Neutrophils: 51 %
Platelets: 296 10*3/uL (ref 150–450)
RBC: 4.65 x10E6/uL (ref 3.77–5.28)
RDW: 12.9 % (ref 11.7–15.4)
WBC: 5.9 10*3/uL (ref 3.4–10.8)

## 2023-02-27 LAB — LIPID PANEL
Chol/HDL Ratio: 4.5 ratio — ABNORMAL HIGH (ref 0.0–4.4)
Cholesterol, Total: 148 mg/dL (ref 100–199)
HDL: 33 mg/dL — ABNORMAL LOW (ref >39)
LDL Chol Calc (NIH): 63 mg/dL (ref 0–99)
Triglycerides: 333 mg/dL — ABNORMAL HIGH (ref 0–149)
VLDL Cholesterol Cal: 52 mg/dL — ABNORMAL HIGH (ref 5–40)

## 2023-02-27 LAB — HEMOGLOBIN A1C
Est. average glucose Bld gHb Est-mCnc: 217 mg/dL
Hgb A1c MFr Bld: 9.2 % — ABNORMAL HIGH (ref 4.8–5.6)

## 2023-02-28 ENCOUNTER — Other Ambulatory Visit: Payer: Self-pay

## 2023-02-28 ENCOUNTER — Other Ambulatory Visit (INDEPENDENT_AMBULATORY_CARE_PROVIDER_SITE_OTHER): Payer: Self-pay | Admitting: Primary Care

## 2023-02-28 DIAGNOSIS — E7841 Elevated Lipoprotein(a): Secondary | ICD-10-CM

## 2023-02-28 DIAGNOSIS — Z760 Encounter for issue of repeat prescription: Secondary | ICD-10-CM

## 2023-02-28 LAB — URINE CULTURE

## 2023-02-28 MED ORDER — OMEGA-3-ACID ETHYL ESTERS 1 G PO CAPS
2.0000 g | ORAL_CAPSULE | Freq: Two times a day (BID) | ORAL | 1 refills | Status: AC
  Filled 2023-02-28: qty 120, 30d supply, fill #0

## 2023-02-28 MED ORDER — FENOFIBRATE 145 MG PO TABS
145.0000 mg | ORAL_TABLET | Freq: Every day | ORAL | 1 refills | Status: AC
Start: 2023-02-28 — End: ?
  Filled 2023-02-28: qty 90, 90d supply, fill #0

## 2023-03-01 ENCOUNTER — Other Ambulatory Visit (INDEPENDENT_AMBULATORY_CARE_PROVIDER_SITE_OTHER): Payer: Self-pay | Admitting: Primary Care

## 2023-03-01 ENCOUNTER — Other Ambulatory Visit: Payer: Self-pay

## 2023-03-01 ENCOUNTER — Telehealth (INDEPENDENT_AMBULATORY_CARE_PROVIDER_SITE_OTHER): Payer: Self-pay

## 2023-03-01 MED ORDER — CIPROFLOXACIN HCL 500 MG PO TABS
500.0000 mg | ORAL_TABLET | Freq: Two times a day (BID) | ORAL | 0 refills | Status: AC
  Filled 2023-03-01: qty 6, 3d supply, fill #0

## 2023-03-01 NOTE — Telephone Encounter (Signed)
[employer] [place]  Id# [phone]  contacted pt to go over lab results pt is aware and doesn't have any questions or concerns

## 2023-03-02 ENCOUNTER — Encounter (INDEPENDENT_AMBULATORY_CARE_PROVIDER_SITE_OTHER): Payer: Self-pay | Admitting: Primary Care

## 2023-03-07 ENCOUNTER — Other Ambulatory Visit: Payer: Self-pay

## 2023-03-11 ENCOUNTER — Encounter (INDEPENDENT_AMBULATORY_CARE_PROVIDER_SITE_OTHER): Payer: Self-pay | Admitting: Primary Care

## 2023-03-11 ENCOUNTER — Other Ambulatory Visit (HOSPITAL_COMMUNITY)
Admission: RE | Admit: 2023-03-11 | Discharge: 2023-03-11 | Disposition: A | Discharge status: Home / Self Care | Payer: Self-pay | Source: Ambulatory Visit | Attending: Primary Care | Admitting: Primary Care

## 2023-03-11 ENCOUNTER — Ambulatory Visit (INDEPENDENT_AMBULATORY_CARE_PROVIDER_SITE_OTHER): Payer: Self-pay | Admitting: Primary Care

## 2023-03-11 VITALS — BP 144/90 | HR 72 | Resp 16 | Wt 134.2 lb

## 2023-03-11 DIAGNOSIS — Z124 Encounter for screening for malignant neoplasm of cervix: Secondary | ICD-10-CM

## 2023-03-11 DIAGNOSIS — Z1231 Encounter for screening mammogram for malignant neoplasm of breast: Secondary | ICD-10-CM

## 2023-03-13 LAB — CERVICOVAGINAL ANCILLARY ONLY
Bacterial Vaginitis (gardnerella): POSITIVE — AB
Candida Glabrata: NEGATIVE
Candida Vaginitis: NEGATIVE
Chlamydia: NEGATIVE
Comment: NEGATIVE
Comment: NEGATIVE
Comment: NEGATIVE
Comment: NEGATIVE
Comment: NEGATIVE
Comment: NORMAL
Neisseria Gonorrhea: NEGATIVE
Trichomonas: NEGATIVE

## 2023-03-13 LAB — CYTOLOGY - PAP
Comment: NEGATIVE
Diagnosis: NEGATIVE
High risk HPV: NEGATIVE

## 2023-03-15 ENCOUNTER — Other Ambulatory Visit (INDEPENDENT_AMBULATORY_CARE_PROVIDER_SITE_OTHER): Payer: Self-pay | Admitting: Primary Care

## 2023-03-15 ENCOUNTER — Other Ambulatory Visit: Payer: Self-pay

## 2023-03-15 DIAGNOSIS — N3000 Acute cystitis without hematuria: Secondary | ICD-10-CM

## 2023-03-15 MED ORDER — METRONIDAZOLE 500 MG PO TABS
500.0000 mg | ORAL_TABLET | Freq: Two times a day (BID) | ORAL | 0 refills | Status: AC
  Filled 2023-03-15: qty 14, 7d supply, fill #0

## 2023-03-15 MED ORDER — FLUCONAZOLE 150 MG PO TABS
150.0000 mg | ORAL_TABLET | Freq: Every day | ORAL | 1 refills | Status: AC
Start: 2023-03-15 — End: ?
  Filled 2023-03-15: qty 1, 1d supply, fill #0

## 2023-03-17 NOTE — Progress Notes (Signed)
  [facility]  WELL-WOMAN PHYSICAL & PAP Patient name: [name] Perry MRN [id number]  Date of birth: 05/23/1976 Chief Complaint:   Gynecologic Exam  History of Present Illness:   [name] Perry is a 47 y.o. [phone] female being seen today for a routine well-woman exam.  Note blood pressure is elevated at this appointment however no history of hypertension or elevated blood pressure anxious awaiting exam.  CC: GYN  The current method of family planning is IUD.  No LMP recorded. (Menstrual status: IUD). Last pap 03/10/2013. Last mammogram: None. Results were:[facility]land Kitchen Family h/o breast cancer: No Family h/o colorectal cancer: No  Review of Systems:    Denies any headaches, blurred vision, fatigue, shortness of breath, chest pain, abdominal pain, abnormal vaginal discharge/itching/odor/irritation, problems with periods, bowel movements, urination, or intercourse unless otherwise stated above.  Pertinent History Reviewed:   Reviewed past medical,surgical, social and family history.  Reviewed problem list, medications and allergies.  Physical Assessment:   Vitals:   03/11/23 1128 03/11/23 1129  BP: (Abnormal) 152/97 (Abnormal) 144/90  Pulse: 72   Resp: 16   SpO2: 100%   Weight: 134 lb 3.2 oz (60.9 kg)   Body mass index is 22.33 kg/m.        Physical Examination:  General appearance - well appearing, and in no distress Mental status - alert, oriented to person, place, and time Psych:  She has a normal mood and affect Skin - warm and dry, normal color, no suspicious lesions noted Chest - effort normal, all lung fields clear to auscultation bilaterally Heart - normal rate and regular rhythm Neck:  midline trachea, no thyromegaly or nodules Breasts - breasts appear normal, no suspicious masses, no skin or nipple changes or axillary nodes Educated patient on proper self breast examination and had patient to demonstrate SBE. Abdomen - soft, nontender,  nondistended, no masses or organomegaly Pelvic-VULVA: normal appearing vulva with no masses, tenderness or lesions   VAGINA: normal appearing vagina with normal color and discharge, no lesions   CERVIX: normal appearing cervix without discharge or lesions, no CMT UTERUS: uterus is felt to be normal size, shape, consistency and nontender  ADNEXA: No adnexal masses or tenderness noted. Extremities:  No swelling or varicosities noted  No results found for this or any previous visit (from the past 24 hour(s)).   Assessment & Plan:  [name] Perry was seen today for gynecologic exam.  Diagnoses and all orders for this visit:  Cervical cancer screening -     Cervicovaginal ancillary only -     Cytology - PAP  Encounter for screening mammogram for malignant neoplasm of breast -     MM DIGITAL SCREENING BILATERAL; Future     Meds: No orders of the defined types were placed in this encounter.   Follow-up: No follow-ups on file.  This note has been created with Education officer, environmental. Any transcriptional errors are unintentional.   Grayce Sessions, NP 03/17/2023, 5:55 PM

## 2023-03-22 ENCOUNTER — Other Ambulatory Visit: Payer: Self-pay

## 2023-03-25 ENCOUNTER — Ambulatory Visit (INDEPENDENT_AMBULATORY_CARE_PROVIDER_SITE_OTHER): Payer: No Typology Code available for payment source

## 2023-05-06 ENCOUNTER — Other Ambulatory Visit (INDEPENDENT_AMBULATORY_CARE_PROVIDER_SITE_OTHER): Payer: Self-pay | Admitting: Primary Care

## 2023-05-06 ENCOUNTER — Other Ambulatory Visit: Payer: Self-pay

## 2023-05-06 DIAGNOSIS — E119 Type 2 diabetes mellitus without complications: Secondary | ICD-10-CM

## 2023-05-06 DIAGNOSIS — Z760 Encounter for issue of repeat prescription: Secondary | ICD-10-CM

## 2023-05-07 ENCOUNTER — Other Ambulatory Visit: Payer: Self-pay

## 2023-05-07 MED ORDER — METFORMIN HCL ER 500 MG PO TB24
500.0000 mg | ORAL_TABLET | Freq: Two times a day (BID) | ORAL | 1 refills | Status: AC
  Filled 2023-05-07: qty 180, 90d supply, fill #0
  Filled 2023-09-06: qty 180, 90d supply, fill #1

## 2023-05-09 ENCOUNTER — Other Ambulatory Visit: Payer: Self-pay

## 2023-05-14 ENCOUNTER — Telehealth: Payer: Self-pay

## 2023-05-14 NOTE — Telephone Encounter (Signed)
Copied from CRM [id number]. Topic: General - Other >> May 14, 2023 12:47 PM [name] Perry wrote: Reason for CRM: The patient has called to request contact with a member of staff when possible regarding financial counseling   The patient would like to apply for their orange card as well as a letter for services and assistance   Please contact further when possible

## 2023-05-28 ENCOUNTER — Ambulatory Visit (INDEPENDENT_AMBULATORY_CARE_PROVIDER_SITE_OTHER): Payer: No Typology Code available for payment source | Admitting: Primary Care

## 2023-08-15 ENCOUNTER — Ambulatory Visit
Admission: RE | Admit: 2023-08-15 | Discharge: 2023-08-15 | Disposition: A | Discharge status: Home / Self Care | Payer: No Typology Code available for payment source | Source: Ambulatory Visit | Attending: Primary Care | Admitting: Primary Care

## 2023-08-15 DIAGNOSIS — Z1231 Encounter for screening mammogram for malignant neoplasm of breast: Secondary | ICD-10-CM

## 2023-09-06 ENCOUNTER — Other Ambulatory Visit: Payer: Self-pay
